# Patient Record
Sex: Male | Born: 1965
Health system: Southern US, Community
[De-identification: ages and names within clinical notes are randomized; demographics above are authoritative.]

## PROBLEM LIST (undated history)

## (undated) ENCOUNTER — Emergency Department (HOSPITAL_BASED_OUTPATIENT_CLINIC_OR_DEPARTMENT_OTHER): Payer: Self-pay | Source: Home / Self Care

## (undated) DIAGNOSIS — N179 Acute kidney failure, unspecified: Secondary | ICD-10-CM

## (undated) DIAGNOSIS — J159 Unspecified bacterial pneumonia: Secondary | ICD-10-CM

## (undated) DIAGNOSIS — I5032 Chronic diastolic (congestive) heart failure: Secondary | ICD-10-CM

## (undated) DIAGNOSIS — R0602 Shortness of breath: Secondary | ICD-10-CM

## (undated) DIAGNOSIS — R112 Nausea with vomiting, unspecified: Secondary | ICD-10-CM

## (undated) DIAGNOSIS — F1910 Other psychoactive substance abuse, uncomplicated: Secondary | ICD-10-CM

## (undated) DIAGNOSIS — D72829 Elevated white blood cell count, unspecified: Secondary | ICD-10-CM

## (undated) DIAGNOSIS — I4892 Unspecified atrial flutter: Secondary | ICD-10-CM

## (undated) HISTORY — DX: Unspecified bacterial pneumonia: J15.9

## (undated) HISTORY — DX: Shortness of breath: R06.02

## (undated) HISTORY — PX: COLONOSCOPY: SHX174

## (undated) HISTORY — DX: Acute kidney failure, unspecified: N17.9

## (undated) HISTORY — DX: Unspecified atrial flutter: I48.92

## (undated) HISTORY — DX: Chronic diastolic (congestive) heart failure: I50.32

## (undated) HISTORY — DX: Elevated white blood cell count, unspecified: D72.829

## (undated) HISTORY — DX: Nausea with vomiting, unspecified: R11.2

---

## 2001-11-09 ENCOUNTER — Encounter: Payer: Self-pay | Admitting: Emergency Medicine

## 2001-11-09 ENCOUNTER — Emergency Department (HOSPITAL_COMMUNITY): Admission: EM | Admit: 2001-11-09 | Discharge: 2001-11-09 | Payer: Self-pay | Admitting: Emergency Medicine

## 2002-11-24 ENCOUNTER — Emergency Department (HOSPITAL_COMMUNITY): Admission: EM | Admit: 2002-11-24 | Discharge: 2002-11-24 | Payer: Self-pay | Admitting: Emergency Medicine

## 2002-11-26 ENCOUNTER — Emergency Department (HOSPITAL_COMMUNITY): Admission: EM | Admit: 2002-11-26 | Discharge: 2002-11-26 | Payer: Self-pay

## 2004-09-05 ENCOUNTER — Emergency Department (HOSPITAL_COMMUNITY): Admission: EM | Admit: 2004-09-05 | Discharge: 2004-09-06 | Payer: Self-pay | Admitting: Emergency Medicine

## 2006-10-06 ENCOUNTER — Emergency Department (HOSPITAL_COMMUNITY): Admission: EM | Admit: 2006-10-06 | Discharge: 2006-10-06 | Payer: Self-pay | Admitting: Emergency Medicine

## 2007-04-05 ENCOUNTER — Emergency Department (HOSPITAL_COMMUNITY): Admission: EM | Admit: 2007-04-05 | Discharge: 2007-04-05 | Payer: Self-pay | Admitting: Emergency Medicine

## 2007-08-20 ENCOUNTER — Emergency Department (HOSPITAL_COMMUNITY): Admission: EM | Admit: 2007-08-20 | Discharge: 2007-08-20 | Payer: Self-pay | Admitting: Emergency Medicine

## 2008-04-25 ENCOUNTER — Emergency Department (HOSPITAL_COMMUNITY): Admission: EM | Admit: 2008-04-25 | Discharge: 2008-04-25 | Payer: Self-pay | Admitting: Emergency Medicine

## 2010-11-26 ENCOUNTER — Emergency Department (HOSPITAL_COMMUNITY)
Admission: EM | Admit: 2010-11-26 | Discharge: 2010-11-26 | Disposition: A | Discharge status: Home / Self Care | Payer: Self-pay | Attending: Emergency Medicine | Admitting: Emergency Medicine

## 2010-11-26 DIAGNOSIS — H02849 Edema of unspecified eye, unspecified eyelid: Secondary | ICD-10-CM | POA: Insufficient documentation

## 2010-11-26 DIAGNOSIS — H5789 Other specified disorders of eye and adnexa: Secondary | ICD-10-CM | POA: Insufficient documentation

## 2010-11-26 DIAGNOSIS — H579 Unspecified disorder of eye and adnexa: Secondary | ICD-10-CM | POA: Insufficient documentation

## 2010-11-26 DIAGNOSIS — H109 Unspecified conjunctivitis: Secondary | ICD-10-CM | POA: Insufficient documentation

## 2011-03-31 LAB — DIFFERENTIAL
Basophils Absolute: 0
Basophils Relative: 0
Eosinophils Absolute: 0.1
Eosinophils Relative: 3
Lymphocytes Relative: 31
Lymphs Abs: 1.7
Monocytes Absolute: 0.4
Monocytes Relative: 7
Neutro Abs: 3.2
Neutrophils Relative %: 60

## 2011-03-31 LAB — BASIC METABOLIC PANEL
BUN: 7
CO2: 25
Calcium: 9
Chloride: 108
Creatinine, Ser: 1.09
GFR calc Af Amer: >60
GFR calc non Af Amer: >60
Glucose, Bld: 77
Potassium: 3.7
Sodium: 140

## 2011-03-31 LAB — CBC
HCT: 41.6
Hemoglobin: 13.5
MCHC: 32.5
MCV: 70 — ABNORMAL LOW
Platelets: 280
RBC: 5.95 — ABNORMAL HIGH
RDW: 16.9 — ABNORMAL HIGH
WBC: 5.5

## 2011-04-20 LAB — I-STAT 8, (EC8 V) (CONVERTED LAB)
BUN: 8
Chloride: 105
Glucose, Bld: 117 — ABNORMAL HIGH
Hemoglobin: 17.3 — ABNORMAL HIGH
Potassium: 3.9
Sodium: 137
TCO2: 26

## 2011-04-20 LAB — DIFFERENTIAL
Eosinophils Absolute: 0.1
Eosinophils Relative: 2
Lymphocytes Relative: 23
Lymphs Abs: 1.7
Monocytes Absolute: 0.4

## 2011-04-20 LAB — CBC
HCT: 45.5
Hemoglobin: 14.7
MCV: 74.3 — ABNORMAL LOW
RDW: 16.5 — ABNORMAL HIGH
WBC: 7.3

## 2011-04-20 LAB — URINALYSIS, ROUTINE W REFLEX MICROSCOPIC
Bilirubin Urine: NEGATIVE
Glucose, UA: NEGATIVE
Ketones, ur: 15 — AB
pH: 5.5

## 2011-04-20 LAB — ETHANOL: Alcohol, Ethyl (B): <5

## 2011-04-20 LAB — RAPID URINE DRUG SCREEN, HOSP PERFORMED
Barbiturates: NOT DETECTED
Cocaine: POSITIVE — AB
Opiates: NOT DETECTED

## 2011-04-20 LAB — URINE MICROSCOPIC-ADD ON

## 2011-04-20 LAB — POCT I-STAT CREATININE: Operator id: 234501

## 2011-06-12 ENCOUNTER — Emergency Department (HOSPITAL_BASED_OUTPATIENT_CLINIC_OR_DEPARTMENT_OTHER)
Admission: EM | Admit: 2011-06-12 | Discharge: 2011-06-12 | Disposition: A | Discharge status: Home / Self Care | Payer: Self-pay | Attending: Emergency Medicine | Admitting: Emergency Medicine

## 2011-06-12 ENCOUNTER — Encounter: Payer: Self-pay | Admitting: *Deleted

## 2011-06-12 DIAGNOSIS — B353 Tinea pedis: Secondary | ICD-10-CM | POA: Insufficient documentation

## 2011-06-12 DIAGNOSIS — F17200 Nicotine dependence, unspecified, uncomplicated: Secondary | ICD-10-CM | POA: Insufficient documentation

## 2011-06-12 DIAGNOSIS — H109 Unspecified conjunctivitis: Secondary | ICD-10-CM | POA: Insufficient documentation

## 2011-06-12 MED ORDER — ERYTHROMYCIN 5 MG/GM OP OINT
TOPICAL_OINTMENT | Freq: Once | OPHTHALMIC | Status: AC
  Administered 2011-06-12: 10:00:00 via OPHTHALMIC
  Filled 2011-06-12: qty 3.5

## 2011-06-12 NOTE — ED Provider Notes (Signed)
History     CSN: [id number] Arrival date & time: 06/12/2011  7:55 AM   First MD Initiated Contact with Patient 06/12/11 [phone]      Chief Complaint  Patient presents with  . Eye Drainage    (Consider location/radiation/quality/duration/timing/severity/associated sxs/prior treatment) HPI  Patient with discharge to right eye for two weeks.  Eye is red with some itching.  Patient states he had conjunctivitis in May.  Patient with normal perceived vision.  No uri symptoms.    History reviewed. No pertinent past medical history.  History reviewed. No pertinent past surgical history.  No family history on file.  History  Substance Use Topics  . Smoking status: Current Everyday Smoker  . Smokeless tobacco: Not on file  . Alcohol Use: No      Review of Systems  All other systems reviewed and are negative.    Allergies  Review of patient's allergies indicates no known allergies.  Home Medications  No current outpatient prescriptions on file.  BP 116/66  Pulse 95  Temp(Src) 98.1 F (36.7 C) (Oral)  Resp 18  Ht 5\' 10"  (1.778 m)  SpO2 99%  Physical Exam  Nursing note and vitals reviewed. Constitutional: He is oriented to person, place, and time. He appears well-developed and well-nourished.  HENT:  Head: Normocephalic and atraumatic.  Eyes: Conjunctivae, EOM and lids are normal. Pupils are equal, round, and reactive to light. Right eye exhibits chemosis. Right eye exhibits no discharge and no exudate. No foreign body present in the right eye.  Cardiovascular: Normal rate and regular rhythm.   Pulmonary/Chest: Effort normal and breath sounds normal.  Abdominal: Soft.  Musculoskeletal: Normal range of motion.       Skin breakdown between 3rd and 4th and 4ht and 5th toe on left foot  Neurological: He is alert and oriented to person, place, and time.  Skin: Skin is warm and dry.  Psychiatric: He has a normal mood and affect.    ED Course  Procedures (including  critical care time)  Labs Reviewed - No data to display No results found.   No diagnosis found.    MDM    Hilario Quarry, MD 06/12/11 [phone]

## 2011-06-12 NOTE — ED Notes (Signed)
Pt amb to room 4 with quick steady gait in nad. Pt reports drainage from his right eye x 3 weeks, states this is the same drainage he had with pink eye over the summer.

## 2011-10-21 ENCOUNTER — Encounter (HOSPITAL_COMMUNITY): Payer: Self-pay | Admitting: Emergency Medicine

## 2011-10-21 ENCOUNTER — Inpatient Hospital Stay (HOSPITAL_COMMUNITY)
Admission: EM | Admit: 2011-10-21 | Discharge: 2011-10-24 | DRG: 194 | Disposition: A | Discharge status: Home / Self Care | Payer: 59 | Attending: Internal Medicine | Admitting: Internal Medicine

## 2011-10-21 ENCOUNTER — Emergency Department (HOSPITAL_COMMUNITY): Payer: Self-pay

## 2011-10-21 DIAGNOSIS — D72829 Elevated white blood cell count, unspecified: Secondary | ICD-10-CM | POA: Diagnosis present

## 2011-10-21 DIAGNOSIS — I5032 Chronic diastolic (congestive) heart failure: Secondary | ICD-10-CM | POA: Diagnosis present

## 2011-10-21 DIAGNOSIS — J159 Unspecified bacterial pneumonia: Secondary | ICD-10-CM | POA: Diagnosis present

## 2011-10-21 DIAGNOSIS — N289 Disorder of kidney and ureter, unspecified: Secondary | ICD-10-CM

## 2011-10-21 DIAGNOSIS — J13 Pneumonia due to Streptococcus pneumoniae: Principal | ICD-10-CM | POA: Diagnosis present

## 2011-10-21 DIAGNOSIS — R509 Fever, unspecified: Secondary | ICD-10-CM | POA: Diagnosis present

## 2011-10-21 DIAGNOSIS — J189 Pneumonia, unspecified organism: Secondary | ICD-10-CM

## 2011-10-21 DIAGNOSIS — N179 Acute kidney failure, unspecified: Secondary | ICD-10-CM | POA: Diagnosis present

## 2011-10-21 DIAGNOSIS — R0602 Shortness of breath: Secondary | ICD-10-CM | POA: Diagnosis present

## 2011-10-21 DIAGNOSIS — I4892 Unspecified atrial flutter: Secondary | ICD-10-CM | POA: Diagnosis present

## 2011-10-21 DIAGNOSIS — F17200 Nicotine dependence, unspecified, uncomplicated: Secondary | ICD-10-CM | POA: Diagnosis present

## 2011-10-21 DIAGNOSIS — R0902 Hypoxemia: Secondary | ICD-10-CM

## 2011-10-21 DIAGNOSIS — R112 Nausea with vomiting, unspecified: Secondary | ICD-10-CM | POA: Diagnosis present

## 2011-10-21 DIAGNOSIS — I509 Heart failure, unspecified: Secondary | ICD-10-CM | POA: Diagnosis present

## 2011-10-21 DIAGNOSIS — D518 Other vitamin B12 deficiency anemias: Secondary | ICD-10-CM | POA: Diagnosis present

## 2011-10-21 DIAGNOSIS — I4891 Unspecified atrial fibrillation: Secondary | ICD-10-CM | POA: Diagnosis not present

## 2011-10-21 DIAGNOSIS — Z720 Tobacco use: Secondary | ICD-10-CM | POA: Diagnosis present

## 2011-10-21 HISTORY — DX: Other psychoactive substance abuse, uncomplicated: F19.10

## 2011-10-21 LAB — CBC
Hemoglobin: 13.7 g/dL (ref 13.0–17.0)
MCHC: 34.2 g/dL (ref 30.0–36.0)

## 2011-10-21 LAB — DIFFERENTIAL
Basophils Absolute: 0 10*3/uL (ref 0.0–0.1)
Basophils Relative: 0 % (ref 0–1)
Eosinophils Absolute: 0 10*3/uL (ref 0.0–0.7)
Lymphocytes Relative: 5 % — ABNORMAL LOW (ref 12–46)
Neutrophils Relative %: 90 % — ABNORMAL HIGH (ref 43–77)
WBC Morphology: INCREASED

## 2011-10-21 LAB — COMPREHENSIVE METABOLIC PANEL
AST: 35 U/L (ref 0–37)
Albumin: 3.1 g/dL — ABNORMAL LOW (ref 3.5–5.2)
Calcium: 8.2 mg/dL — ABNORMAL LOW (ref 8.4–10.5)
Chloride: 101 mEq/L (ref 96–112)
Creatinine, Ser: 1.68 mg/dL — ABNORMAL HIGH (ref 0.50–1.35)

## 2011-10-21 LAB — URINALYSIS, ROUTINE W REFLEX MICROSCOPIC
Glucose, UA: NEGATIVE mg/dL
Specific Gravity, Urine: 1.022 (ref 1.005–1.030)
pH: 6 (ref 5.0–8.0)

## 2011-10-21 LAB — URINE MICROSCOPIC-ADD ON: Urine-Other: NONE SEEN

## 2011-10-21 LAB — MAGNESIUM: Magnesium: 1.7 mg/dL (ref 1.5–2.5)

## 2011-10-21 MED ORDER — NICOTINE 14 MG/24HR TD PT24
14.0000 mg | MEDICATED_PATCH | Freq: Every day | TRANSDERMAL | Status: DC
  Administered 2011-10-21 – 2011-10-24 (×4): 14 mg via TRANSDERMAL
  Filled 2011-10-21 (×5): qty 1

## 2011-10-21 MED ORDER — DEXTROSE 5 % IV SOLN
1.0000 g | INTRAVENOUS | Status: DC
  Administered 2011-10-21 – 2011-10-23 (×3): 1 g via INTRAVENOUS
  Filled 2011-10-21 (×4): qty 10

## 2011-10-21 MED ORDER — ONDANSETRON HCL 4 MG/2ML IJ SOLN
4.0000 mg | Freq: Once | INTRAMUSCULAR | Status: AC
  Administered 2011-10-21: 4 mg via INTRAVENOUS
  Filled 2011-10-21: qty 2

## 2011-10-21 MED ORDER — ACETAMINOPHEN 325 MG PO TABS
650.0000 mg | ORAL_TABLET | Freq: Once | ORAL | Status: AC
  Administered 2011-10-21: 650 mg via ORAL
  Filled 2011-10-21: qty 2

## 2011-10-21 MED ORDER — ONDANSETRON HCL 4 MG/2ML IJ SOLN: 4.0000 mg | Freq: Four times a day (QID) | INTRAMUSCULAR | Status: DC | PRN

## 2011-10-21 MED ORDER — ALBUTEROL SULFATE (5 MG/ML) 0.5% IN NEBU: 2.5000 mg | INHALATION_SOLUTION | Freq: Four times a day (QID) | RESPIRATORY_TRACT | Status: DC | PRN

## 2011-10-21 MED ORDER — ACETAMINOPHEN 650 MG RE SUPP: 650.0000 mg | Freq: Four times a day (QID) | RECTAL | Status: DC | PRN

## 2011-10-21 MED ORDER — ONDANSETRON HCL 4 MG PO TABS: 4.0000 mg | ORAL_TABLET | Freq: Four times a day (QID) | ORAL | Status: DC | PRN

## 2011-10-21 MED ORDER — MORPHINE SULFATE 2 MG/ML IJ SOLN: 1.0000 mg | INTRAMUSCULAR | Status: DC | PRN

## 2011-10-21 MED ORDER — ACETAMINOPHEN 325 MG PO TABS
650.0000 mg | ORAL_TABLET | Freq: Four times a day (QID) | ORAL | Status: DC | PRN
  Administered 2011-10-21 – 2011-10-22 (×3): 650 mg via ORAL
  Filled 2011-10-21 (×3): qty 2

## 2011-10-21 MED ORDER — SODIUM CHLORIDE 0.9 % IV SOLN
INTRAVENOUS | Status: DC
  Administered 2011-10-21 – 2011-10-24 (×4): via INTRAVENOUS

## 2011-10-21 MED ORDER — OXYCODONE HCL 5 MG PO TABS
5.0000 mg | ORAL_TABLET | ORAL | Status: DC | PRN
  Administered 2011-10-22 – 2011-10-23 (×3): 5 mg via ORAL
  Filled 2011-10-21 (×3): qty 1

## 2011-10-21 MED ORDER — DEXTROSE 5 % IV SOLN
500.0000 mg | INTRAVENOUS | Status: DC
  Administered 2011-10-21 – 2011-10-23 (×3): 500 mg via INTRAVENOUS
  Filled 2011-10-21 (×4): qty 500

## 2011-10-21 MED ORDER — ALBUTEROL SULFATE (5 MG/ML) 0.5% IN NEBU
5.0000 mg | INHALATION_SOLUTION | Freq: Once | RESPIRATORY_TRACT | Status: AC
  Administered 2011-10-21: 5 mg via RESPIRATORY_TRACT
  Filled 2011-10-21: qty 1

## 2011-10-21 MED ORDER — SODIUM CHLORIDE 0.9 % IV BOLUS (SEPSIS)
1000.0000 mL | Freq: Once | INTRAVENOUS | Status: AC
  Administered 2011-10-21: 1000 mL via INTRAVENOUS

## 2011-10-21 MED ORDER — IPRATROPIUM BROMIDE 0.02 % IN SOLN
0.5000 mg | Freq: Once | RESPIRATORY_TRACT | Status: AC
  Administered 2011-10-21: 0.5 mg via RESPIRATORY_TRACT
  Filled 2011-10-21: qty 2.5

## 2011-10-21 MED ORDER — ENOXAPARIN SODIUM 40 MG/0.4ML ~~LOC~~ SOLN
40.0000 mg | SUBCUTANEOUS | Status: DC
  Administered 2011-10-21 – 2011-10-23 (×3): 40 mg via SUBCUTANEOUS
  Filled 2011-10-21 (×4): qty 0.4

## 2011-10-21 MED ORDER — GUAIFENESIN-DM 100-10 MG/5ML PO SYRP: 5.0000 mL | ORAL_SOLUTION | ORAL | Status: DC | PRN

## 2011-10-21 NOTE — ED Provider Notes (Signed)
History     CSN: [id number]  Arrival date & time 10/21/11  1553   First MD Initiated Contact with Patient 10/21/11 1603      Chief Complaint  Patient presents with  . Morning Sickness    (Consider location/radiation/quality/duration/timing/severity/associated sxs/prior treatment) The history is provided by the patient.  pt c/o fevers, 'bad' non productive cough, body aches, headaches, sore throat, in past 3-4 days. Smokes 2-3 packs per day. Denies hx asthma, but states feels wheezy at times. No known ill contacts. Denies trouble swallowing. + mild sob. No chest pain. No abd pain. States intermittent nvd. Emesis clear, not bloody or bilious. Diarrhea not bloody. No known ill contacts or bad food exposure. Denies hx chronic illness. No night sweats/chills. Headaches frontal, gradual onset, comes and goes, c/w prior headaches. No neck stiffness/pain. No sinus drainage or pain.   History reviewed. No pertinent past medical history.  History reviewed. No pertinent past surgical history.  No family history on file.  History  Substance Use Topics  . Smoking status: Current Everyday Smoker  . Smokeless tobacco: Not on file  . Alcohol Use: No      Review of Systems  Constitutional: Negative for fever and chills.  HENT: Positive for sore throat. Negative for neck pain, neck stiffness and sinus pressure.   Eyes: Negative for pain, discharge, redness and visual disturbance.  Respiratory: Positive for cough and shortness of breath.   Cardiovascular: Negative for chest pain.  Gastrointestinal: Positive for nausea, vomiting and diarrhea. Negative for abdominal pain.  Genitourinary: Negative for dysuria and flank pain.  Musculoskeletal: Negative for back pain.  Skin: Negative for rash.  Neurological: Positive for headaches. Negative for weakness and numbness.  Hematological: Does not bruise/bleed easily.  Psychiatric/Behavioral: Negative for confusion.    Allergies  Review of  patient's allergies indicates no known allergies.  Home Medications   Current Outpatient Rx  Name Route Sig Dispense Refill  . IBUPROFEN 400 MG PO TABS Oral Take 400 mg by mouth every 6 (six) hours as needed. For pain relief      BP 148/63  Pulse 124  Temp(Src) 103 F (39.4 C) (Oral)  SpO2 91%  Physical Exam  Nursing note and vitals reviewed. Constitutional: He is oriented to person, place, and time. He appears well-developed and well-nourished. No distress.  HENT:  Head: Atraumatic.  Nose: Nose normal.  Mouth/Throat: Oropharynx is clear and moist.       No sinus or temporal tenderness  Eyes: Pupils are equal, round, and reactive to light. Right eye exhibits no discharge. Left eye exhibits no discharge. No scleral icterus.  Neck: Normal range of motion. Neck supple. No tracheal deviation present.       Good rom. No stiffness or rigidity  Cardiovascular: Regular rhythm, normal heart sounds and intact distal pulses.  Exam reveals no gallop and no friction rub.   No murmur heard. Pulmonary/Chest: Effort normal. No accessory muscle usage. No respiratory distress.       Upper resp congestion, cough, sl wheeze.   Abdominal: Soft. Bowel sounds are normal. He exhibits no distension and no mass. There is no tenderness. There is no rebound and no guarding.  Genitourinary:       No cva tenderness  Musculoskeletal: Normal range of motion. He exhibits no edema and no tenderness.  Lymphadenopathy:    He has no cervical adenopathy.  Neurological: He is alert and oriented to person, place, and time.       Motor intact bilat.  Steady gait.   Skin: Skin is warm and dry. No rash noted. He is not diaphoretic.       No petechia, no rash   Psychiatric: He has a normal mood and affect.    ED Course  Procedures (including critical care time)   Labs Reviewed  CBC  DIFFERENTIAL  COMPREHENSIVE METABOLIC PANEL  URINALYSIS, ROUTINE W REFLEX MICROSCOPIC    Results for orders placed during the  hospital encounter of 10/21/11  CBC      Component Value Range   WBC 15.7 (*) 4.0 - 10.5 (K/uL)   RBC 5.96 (*) 4.22 - 5.81 (MIL/uL)   Hemoglobin 13.7  13.0 - 17.0 (g/dL)   HCT 11.9  14.7 - 82.9 (%)   MCV 67.3 (*) 78.0 - 100.0 (fL)   MCH 23.0 (*) 26.0 - 34.0 (pg)   MCHC 34.2  30.0 - 36.0 (g/dL)   RDW 56.2  13.0 - 86.5 (%)   Platelets 341  150 - 400 (K/uL)  DIFFERENTIAL      Component Value Range   Neutrophils Relative 90 (*) 43 - 77 (%)   Lymphocytes Relative 5 (*) 12 - 46 (%)   Monocytes Relative 5  3 - 12 (%)   Eosinophils Relative 0  0 - 5 (%)   Basophils Relative 0  0 - 1 (%)   Neutro Abs 14.1 (*) 1.7 - 7.7 (K/uL)   Lymphs Abs 0.8  0.7 - 4.0 (K/uL)   Monocytes Absolute 0.8  0.1 - 1.0 (K/uL)   Eosinophils Absolute 0.0  0.0 - 0.7 (K/uL)   Basophils Absolute 0.0  0.0 - 0.1 (K/uL)   RBC Morphology TARGET CELLS     WBC Morphology INCREASED BANDS (>20% BANDS)     Smear Review LARGE PLATELETS PRESENT    COMPREHENSIVE METABOLIC PANEL      Component Value Range   Sodium 135  135 - 145 (mEq/L)   Potassium 3.8  3.5 - 5.1 (mEq/L)   Chloride 101  96 - 112 (mEq/L)   CO2 23  19 - 32 (mEq/L)   Glucose, Bld 115 (*) 70 - 99 (mg/dL)   BUN 15  6 - 23 (mg/dL)   Creatinine, Ser 7.84 (*) 0.50 - 1.35 (mg/dL)   Calcium 8.2 (*) 8.4 - 10.5 (mg/dL)   Total Protein 6.7  6.0 - 8.3 (g/dL)   Albumin 3.1 (*) 3.5 - 5.2 (g/dL)   AST 35  0 - 37 (U/L)   ALT 36  0 - 53 (U/L)   Alkaline Phosphatase 75  39 - 117 (U/L)   Total Bilirubin 1.9 (*) 0.3 - 1.2 (mg/dL)   GFR calc non Af Amer 48 (*) >90 (mL/min)   GFR calc Af Amer 56 (*) >90 (mL/min)   Dg Chest 2 View  10/21/2011  *RADIOLOGY REPORT*  Clinical Data: Cough.  Fever and shortness of breath.  CHEST - 2 VIEW  Comparison: None.  Findings: The heart size is normal.  New left lower lobe airspace disease is present.  The right lung is clear.  The upper lobes are clear bilaterally.  The visualized soft tissues and bony thorax are unremarkable.  IMPRESSION:   1.  New left lower lobe pneumonia.  Recommend follow-up chest radiograph to assure resolution of this lesion. 2.  The lungs are otherwise clear.  Original Report Authenticated By: Jamesetta Orleans. MATTERN, M.D.      MDM  Iv ns bolus. Cxr. Labs.  Albuterol and atrovent neb. Tylenol po.  Date: 10/21/2011  Rate: 115  Rhythm: sinus tachycardia  QRS Axis: normal  Intervals: normal  ST/T Wave abnormalities: nonspecific ST/T changes  Conduction Disutrbances:none  Narrative Interpretation:   Old EKG Reviewed: changes noted    pna on cxr. No recent hospitalization. Rocephin and zithromax iv for cap.   Recheck no wheezing post neb. Pt does note mild dyspnea. Room air pulse ox 88%. 2 liters 91-92%.  Given cap, hypoxia, renal insuff, recent nvd. will admit.      Suzi Roots, MD 10/21/11 [phone]

## 2011-10-21 NOTE — H&P (Signed)
PCP:   No primary provider on file.   Chief Complaint:  Productive cough, Fever, SOB, general malaise.  HPI: 46 y/o male with pmh significant for tobacco abuse; come to ED complaining of general malaise, productive cough and on/off fever for the last 5 days. He reports at the beginning he thought was just a cold and start using OTC cold remedies; but symptoms continue worsen and for the last 48 hours he has not been able to eat or drink due to associated nausea; also with 1 episode of vomiting and diarrhea.   In the ED he was found dehydrated; with fever of 103; hypoxic with O2 sat in 89% and acute renal failure with Cr of 1.68. CXR demonstrated LLL PNA.  He also reports some wheezing and headaches; but denies chest pain, melena, hematochezia, abdominal pain, blurred vision or any other acute complaints.  Allergies:  No Known Allergies   History reviewed. No pertinent past medical history.  History reviewed. No pertinent past surgical history.  Prior to Admission medications   Medication Sig Start Date End Date Taking? Authorizing Provider  ibuprofen (ADVIL,MOTRIN) 400 MG tablet Take 400 mg by mouth every 6 (six) hours as needed. For pain relief   Yes Historical Provider, MD    Social History:  reports that he has been smoking.  He does not have any smokeless tobacco history on file. Endorses hx of alcohol in the past (heavy use); clean for almost 14 months. Also reports marihuana and crack use in the past (clean for 5 years).  Family History:  father and mother with sickle cell trait; Mother also with dementia and COPD (oxygen dependent) No pertinent family history. Father and one sister with DM.  Review of Systems:  Negative except as mentioned on HPI.   Physical Exam: Blood pressure 148/63, pulse 117, temperature 103 F (39.4 C), temperature source Oral, resp. rate 25, SpO2 89.00%. Constitutional: He is oriented to person, place, and time. Dry mucous membranes and ill  appearance.  HENT:  Head: Atraumatic.  Nose: Nose normal.  Mouth/Throat: Oropharynx with dry MM; no erythema and no exudates.  Eyes: Pupils are equal, round, and reactive to light. Right eye exhibits no discharge. Left eye exhibits no discharge. No scleral icterus.  Neck: Normal range of motion. Neck supple. No tracheal deviation present. Good rom. No stiffness or rigidity  Cardiovascular: Regular rhythm, Tachycardic; no murmurs, no gallops or rubs. Pulmonary/Chest: No accessory  Muscle use; scattered wheezes and positive rhonchi (especially LLL)  Abdominal: Soft. No tenderness, no rebound; positive BS and no distension. Musculoskeletal: Normal range of motion. He exhibits no edema and no tenderness.  Lymphadenopathy: He has no cervical adenopathy.  Neurological: He is alert and oriented to person, place, and time. Motor intact bilat. Steady gait. Non focal deficit. Skin: Clammy and warm to touch; no rash or petechiae.   Labs on Admission:  Results for orders placed during the hospital encounter of 10/21/11 (from the past 48 hour(s))  CBC     Status: Abnormal   Collection Time   10/21/11  4:45 PM      Component Value Range Comment   WBC 15.7 (*) 4.0 - 10.5 (K/uL)    RBC 5.96 (*) 4.22 - 5.81 (MIL/uL)    Hemoglobin 13.7  13.0 - 17.0 (g/dL)    HCT 16.1  09.6 - 04.5 (%)    MCV 67.3 (*) 78.0 - 100.0 (fL)    MCH 23.0 (*) 26.0 - 34.0 (pg)    MCHC 34.2  30.0 - 36.0 (g/dL)    RDW 86.5  78.4 - 69.6 (%)    Platelets 341  150 - 400 (K/uL)   DIFFERENTIAL     Status: Abnormal   Collection Time   10/21/11  4:45 PM      Component Value Range Comment   Neutrophils Relative 90 (*) 43 - 77 (%)    Lymphocytes Relative 5 (*) 12 - 46 (%)    Monocytes Relative 5  3 - 12 (%)    Eosinophils Relative 0  0 - 5 (%)    Basophils Relative 0  0 - 1 (%)    Neutro Abs 14.1 (*) 1.7 - 7.7 (K/uL)    Lymphs Abs 0.8  0.7 - 4.0 (K/uL)    Monocytes Absolute 0.8  0.1 - 1.0 (K/uL)    Eosinophils Absolute 0.0  0.0 -  0.7 (K/uL)    Basophils Absolute 0.0  0.0 - 0.1 (K/uL)    RBC Morphology TARGET CELLS   SCHISTOCYTES PRESENT (2-5/hpf)   WBC Morphology INCREASED BANDS (>20% BANDS)      Smear Review LARGE PLATELETS PRESENT     COMPREHENSIVE METABOLIC PANEL     Status: Abnormal   Collection Time   10/21/11  4:45 PM      Component Value Range Comment   Sodium 135  135 - 145 (mEq/L)    Potassium 3.8  3.5 - 5.1 (mEq/L)    Chloride 101  96 - 112 (mEq/L)    CO2 23  19 - 32 (mEq/L)    Glucose, Bld 115 (*) 70 - 99 (mg/dL)    BUN 15  6 - 23 (mg/dL)    Creatinine, Ser 2.95 (*) 0.50 - 1.35 (mg/dL)    Calcium 8.2 (*) 8.4 - 10.5 (mg/dL)    Total Protein 6.7  6.0 - 8.3 (g/dL)    Albumin 3.1 (*) 3.5 - 5.2 (g/dL)    AST 35  0 - 37 (U/L)    ALT 36  0 - 53 (U/L)    Alkaline Phosphatase 75  39 - 117 (U/L)    Total Bilirubin 1.9 (*) 0.3 - 1.2 (mg/dL)    GFR calc non Af Amer 48 (*) >90 (mL/min)    GFR calc Af Amer 56 (*) >90 (mL/min)     Radiological Exams on Admission: Dg Chest 2 View  10/21/2011  *RADIOLOGY REPORT*  Clinical Data: Cough.  Fever and shortness of breath.  CHEST - 2 VIEW  Comparison: None.  Findings: The heart size is normal.  New left lower lobe airspace disease is present.  The right lung is clear.  The upper lobes are clear bilaterally.  The visualized soft tissues and bony thorax are unremarkable.  IMPRESSION:  1.  New left lower lobe pneumonia.  Recommend follow-up chest radiograph to assure resolution of this lesion. 2.  The lungs are otherwise clear.  Original Report Authenticated By: Jamesetta Orleans. MATTERN, M.D.    Assessment/Plan 1-SOB 2/2 Community acquired bacterial pneumonia: will admit to med surg bed; provide fluid resuscitation and supportive care, get strep urine antigen, legionella antigen and sputum cx; will treat with zithromax and rocephin. Will use supplemental oxygen. Will check HIV.  2-N&V (nausea and vomiting), Fever and general malaise: associated with #1; will use PRN  antiemetics and antipyretics.  3-ARF (acute renal failure): pre-renal in origin due to dehydration. Will give IVF's; will check random urine and creatinine.  4-Tobacco abuse: counseling provided. Will start nicotine patch.  5-Leukocytosis: 2/2 demargination and infection.  Will treat with abx's; will give IVF's and will follow trend of his WBC's.  6-Hx of alcohol and drug abuse: will check alcohol level and UDS.  7-Hx of first degree member with DM: will check A1C.  8-DVT:lovenox     Time Spent on Admission: 45 minutes  Verlee Pope [facility] [phone]  10/21/2011, 6:37 PM

## 2011-10-21 NOTE — ED Notes (Signed)
Patient transported to X-ray 

## 2011-10-21 NOTE — ED Notes (Signed)
Per pt, N/V/D since Thursday, fevers on and off. Pt states intermit fatigue and headaches. Pt reports dry cough only.

## 2011-10-21 NOTE — ED Notes (Signed)
Respiratory paged for nebs. 

## 2011-10-21 NOTE — ED Notes (Signed)
ION:[id number]<MW> Expected date:10/21/11<BR> Expected time: 3:34 PM<BR> Means of arrival:Ambulance<BR> Comments:<BR> M51. 46 yo m. Sick; vomiting, diarrhea and positve orthros. 15 mins

## 2011-10-22 DIAGNOSIS — I4892 Unspecified atrial flutter: Secondary | ICD-10-CM | POA: Diagnosis present

## 2011-10-22 LAB — LEGIONELLA ANTIGEN, URINE

## 2011-10-22 LAB — CBC
Platelets: 289 10*3/uL (ref 150–400)
RBC: 5.62 MIL/uL (ref 4.22–5.81)
RDW: 14.5 % (ref 11.5–15.5)
WBC: 17.1 10*3/uL — ABNORMAL HIGH (ref 4.0–10.5)

## 2011-10-22 LAB — HEMOGLOBIN A1C
Hgb A1c MFr Bld: 5.7 % — ABNORMAL HIGH (ref <5.7)
Mean Plasma Glucose: 117 mg/dL — ABNORMAL HIGH (ref <117)

## 2011-10-22 LAB — BASIC METABOLIC PANEL
Calcium: 8.1 mg/dL — ABNORMAL LOW (ref 8.4–10.5)
GFR calc Af Amer: 66 mL/min — ABNORMAL LOW (ref >90)
GFR calc non Af Amer: 57 mL/min — ABNORMAL LOW (ref >90)
Sodium: 137 mEq/L (ref 135–145)

## 2011-10-22 LAB — TSH: TSH: 0.311 u[IU]/mL — ABNORMAL LOW (ref 0.350–4.500)

## 2011-10-22 LAB — RAPID URINE DRUG SCREEN, HOSP PERFORMED
Amphetamines: NOT DETECTED
Benzodiazepines: NOT DETECTED
Opiates: NOT DETECTED

## 2011-10-22 LAB — STREP PNEUMONIAE URINARY ANTIGEN: Strep Pneumo Urinary Antigen: POSITIVE — AB

## 2011-10-22 LAB — HIV ANTIBODY (ROUTINE TESTING W REFLEX): HIV: NONREACTIVE

## 2011-10-22 MED ORDER — DILTIAZEM HCL 100 MG IV SOLR
5.0000 mg/h | INTRAVENOUS | Status: DC
  Administered 2011-10-22: 5 mg/h via INTRAVENOUS
  Administered 2011-10-23: 10 mg/h via INTRAVENOUS
  Filled 2011-10-22 (×2): qty 100

## 2011-10-22 MED ORDER — IBUPROFEN 400 MG PO TABS
400.0000 mg | ORAL_TABLET | Freq: Once | ORAL | Status: AC
  Administered 2011-10-22: 400 mg via ORAL
  Filled 2011-10-22: qty 1

## 2011-10-22 MED ORDER — K PHOS MONO-SOD PHOS DI & MONO 155-852-130 MG PO TABS
500.0000 mg | ORAL_TABLET | Freq: Two times a day (BID) | ORAL | Status: DC
  Administered 2011-10-22 – 2011-10-24 (×5): 500 mg via ORAL
  Filled 2011-10-22 (×6): qty 2

## 2011-10-22 MED ORDER — METOPROLOL TARTRATE 1 MG/ML IV SOLN
2.5000 mg | Freq: Once | INTRAVENOUS | Status: AC
  Administered 2011-10-22: 2.5 mg via INTRAVENOUS
  Filled 2011-10-22: qty 5

## 2011-10-22 NOTE — Progress Notes (Signed)
Pt HR remains at 168, temp decreased to 98.4, BP 115/73 O2 sat 96 on 2L. Dr Gwenlyn Perking notified of status. Annitta Needs, RN

## 2011-10-22 NOTE — Progress Notes (Addendum)
Subjective: Patient still with fever overnight; but feeling a lot better overall. Appetite still poor. No CP; he is also breathing better and his O2 sat on RA was in the 93%  Objective: Vital signs in last 24 hours: Temp:  [98.4 F (36.9 C)-103 F (39.4 C)] 98.4 F (36.9 C) (04/14 0848) Pulse Rate:  [113-125] 113  (04/14 0500) Resp:  [20-25] 20  (04/14 0500) BP: (107-148)/(54-73) 117/60 mmHg (04/14 0500) SpO2:  [89 %-100 %] 96 % (04/14 0500) Weight:  [80.831 kg (178 lb 3.2 oz)] 80.831 kg (178 lb 3.2 oz) (04/13 2020) Weight change:  Last BM Date: 10/21/11  Intake/Output from previous day: 04/13 0701 - 04/14 0700 In: 816.7 [I.V.:816.7] Out: 650 [Urine:650]     Physical Exam: General: Alert, awake, oriented x3, in no acute distress. HEENT: No bruits, no goiter. Heart: Regular rhythm, still mild tachycardic; No murmurs, rubs or gallops. Lungs: improved air movement; no wheezing; some scattered rhonchi still present. Abdomen: Soft, nontender, nondistended, positive bowel sounds. Extremities: No clubbing, cyanosis or edema with positive pedal pulses. Neuro: Grossly intact, nonfocal.  Lab Results: Basic Metabolic Panel:  Basename 10/22/11 0445 10/21/11 2130 10/21/11 1645  NA 137 -- 135  K 4.1 -- 3.8  CL 103 -- 101  CO2 23 -- 23  GLUCOSE 109* -- 115*  BUN 13 -- 15  CREATININE 1.46* -- 1.68*  CALCIUM 8.1* -- 8.2*  MG -- 1.7 --  PHOS -- 1.5* --   Liver Function Tests:  [facility] 10/21/11 1645  AST 35  ALT 36  ALKPHOS 75  BILITOT 1.9*  PROT 6.7  ALBUMIN 3.1*   CBC:  Basename 10/22/11 0445 10/21/11 1645  WBC 17.1* 15.7*  NEUTROABS -- 14.1*  HGB 13.0 13.7  HCT 38.4* 40.1  MCV 68.3* 67.3*  PLT 289 341   Hemoglobin A1C:  Basename 10/21/11 2130  HGBA1C 5.7*   Thyroid Function Tests:  Basename 10/21/11 2130  TSH 0.311*  T4TOTAL --  FREET4 --  T3FREE --  THYROIDAB --   Urine Drug Screen: Drugs of Abuse     Component Value Date/Time   LABOPIA NONE  DETECTED 10/21/2011 1827   COCAINSCRNUR NONE DETECTED 10/21/2011 1827   LABBENZ NONE DETECTED 10/21/2011 1827   AMPHETMU NONE DETECTED 10/21/2011 1827   THCU NONE DETECTED 10/21/2011 1827   LABBARB NONE DETECTED 10/21/2011 1827    Alcohol Level:  Basename 10/21/11 2130  ETH <11   Urinalysis:  Basename 10/21/11 1827  COLORURINE ORANGE*  LABSPEC 1.022  PHURINE 6.0  GLUCOSEU NEGATIVE  HGBUR TRACE*  BILIRUBINUR SMALL*  KETONESUR TRACE*  PROTEINUR 30*  UROBILINOGEN >8.0*  NITRITE NEGATIVE  LEUKOCYTESUR NEGATIVE    Studies/Results: Dg Chest 2 View  10/21/2011  *RADIOLOGY REPORT*  Clinical Data: Cough.  Fever and shortness of breath.  CHEST - 2 VIEW  Comparison: None.  Findings: The heart size is normal.  New left lower lobe airspace disease is present.  The right lung is clear.  The upper lobes are clear bilaterally.  The visualized soft tissues and bony thorax are unremarkable.  IMPRESSION:  1.  New left lower lobe pneumonia.  Recommend follow-up chest radiograph to assure resolution of this lesion. 2.  The lungs are otherwise clear.  Original Report Authenticated By: Jamesetta Orleans. MATTERN, M.D.    Medications: Scheduled Meds:   . acetaminophen  650 mg Oral Once  . albuterol  5 mg Nebulization Once  . azithromycin  500 mg Intravenous Q24H  . cefTRIAXone (ROCEPHIN)  IV  1 g Intravenous Q24H  . enoxaparin  40 mg Subcutaneous Q24H  . ipratropium  0.5 mg Nebulization Once  . nicotine  14 mg Transdermal Daily  . ondansetron (ZOFRAN) IV  4 mg Intravenous Once  . phosphorus  500 mg Oral BID  . sodium chloride  1,000 mL Intravenous Once   Continuous Infusions:   . sodium chloride 100 mL/hr at 10/22/11 1026   PRN Meds:.acetaminophen, acetaminophen, albuterol, guaiFENesin-dextromethorphan, morphine, ondansetron (ZOFRAN) IV, ondansetron, oxyCODONE  Assessment/Plan: 1-SOB 2/2 Strep pneumoniae pneumonia: Improving. Will continue IVF antibiotics and supportive care.  2-N&V (nausea and  vomiting), Fever and general malaise: associated with #1; will use PRN antiemetics and antipyretics.   3-ARF (acute renal failure): pre-renal in origin due to dehydration. Continue re-hydration. Creatinine 1.46 and improving.  4-Tobacco abuse: counseling provided. Will continue nicotine patch.   5-Leukocytosis: 2/2 demargination and infection. Continue IVF's and antibiotics; will repeat CBC in am.  6-Hx of alcohol and drug abuse: Alcohol level and UDS negative. Patient congratulated for his effort and dedication of keeping himself clean. Encourage to continue doing such a good job.  7-Hx of first degree member with DM: Hyperglycemia; A1C 5.7; will need lifestyle changes but not medications at this point.  8-Atrial flutter: most likely triggered by fever and acute infection. Will treat with lopressor IV 2.5 mg and monitor him on telemetry.  9-DVT:lovenox     LOS: 1 day   Kiaira Pointer [facility] [phone]  10/22/2011, 10:45 AM

## 2011-10-22 NOTE — Progress Notes (Signed)
Pt noted with BP of 101/54 and HR of 170 temp 101 O2 sat 89% contacted dr Gwenlyn Perking ordered oxygen 2L and tylenol provided patient with tylenol and 2L O2 will check in 30 min and update physician. Annitta Needs, RN

## 2011-10-22 NOTE — Progress Notes (Signed)
EKG performed dr Gwenlyn Perking reviewed ordered to transfer patient to 4th floor for tele.gave pt lopressor as ordered  Annitta Needs, RN

## 2011-10-22 NOTE — Progress Notes (Signed)
Pt A-Fib with HR ranging from  140-150's on telemetry monitor. BP 101/63, R 18, T 97.4. Pt asymptomatic. MD paged, awaiting return call. Will continue to monitor.

## 2011-10-23 ENCOUNTER — Encounter (HOSPITAL_COMMUNITY): Payer: Self-pay | Admitting: Physician Assistant

## 2011-10-23 DIAGNOSIS — I4891 Unspecified atrial fibrillation: Secondary | ICD-10-CM

## 2011-10-23 DIAGNOSIS — I4892 Unspecified atrial flutter: Secondary | ICD-10-CM

## 2011-10-23 LAB — BASIC METABOLIC PANEL
BUN: 10 mg/dL (ref 6–23)
Chloride: 104 mEq/L (ref 96–112)
Creatinine, Ser: 0.92 mg/dL (ref 0.50–1.35)
GFR calc Af Amer: >90 mL/min (ref >90)
GFR calc non Af Amer: >90 mL/min (ref >90)
Glucose, Bld: 80 mg/dL (ref 70–99)

## 2011-10-23 LAB — CBC
HCT: 37.7 % — ABNORMAL LOW (ref 39.0–52.0)
Hemoglobin: 12.9 g/dL — ABNORMAL LOW (ref 13.0–17.0)
MCHC: 34.2 g/dL (ref 30.0–36.0)
MCV: 67.2 fL — ABNORMAL LOW (ref 78.0–100.0)
RDW: 14.5 % (ref 11.5–15.5)

## 2011-10-23 LAB — T4, FREE: Free T4: 1.1 ng/dL (ref 0.80–1.80)

## 2011-10-23 LAB — TSH: TSH: 1.381 u[IU]/mL (ref 0.350–4.500)

## 2011-10-23 LAB — CARDIAC PANEL(CRET KIN+CKTOT+MB+TROPI)
CK, MB: 1.6 ng/mL (ref 0.3–4.0)
Troponin I: <0.3 ng/mL (ref <0.30)

## 2011-10-23 LAB — MAGNESIUM: Magnesium: 2.4 mg/dL (ref 1.5–2.5)

## 2011-10-23 MED ORDER — DIGOXIN 0.25 MG/ML IJ SOLN
0.2500 mg | Freq: Once | INTRAMUSCULAR | Status: AC
  Administered 2011-10-23: 0.25 mg via INTRAVENOUS
  Filled 2011-10-23 (×2): qty 1

## 2011-10-23 MED ORDER — DILTIAZEM HCL 30 MG PO TABS
30.0000 mg | ORAL_TABLET | Freq: Four times a day (QID) | ORAL | Status: DC
  Administered 2011-10-23: 30 mg via ORAL
  Filled 2011-10-23 (×8): qty 1

## 2011-10-23 MED ORDER — DILTIAZEM HCL 100 MG IV SOLR
10.0000 mg/h | INTRAVENOUS | Status: DC
  Administered 2011-10-23 – 2011-10-24 (×2): 10 mg/h via INTRAVENOUS
  Filled 2011-10-23 (×2): qty 100

## 2011-10-23 MED ORDER — DIGOXIN 0.25 MG/ML IJ SOLN
0.2500 mg | Freq: Once | INTRAMUSCULAR | Status: AC
  Administered 2011-10-23: 0.25 mg via INTRAVENOUS
  Filled 2011-10-23: qty 1

## 2011-10-23 MED ORDER — ADULT MULTIVITAMIN W/MINERALS CH
1.0000 | ORAL_TABLET | Freq: Every day | ORAL | Status: DC
  Administered 2011-10-23 – 2011-10-24 (×2): 1 via ORAL
  Filled 2011-10-23 (×2): qty 1

## 2011-10-23 MED ORDER — DIGOXIN 125 MCG PO TABS: 0.1250 mg | ORAL_TABLET | Freq: Every day | ORAL | Status: DC

## 2011-10-23 NOTE — Progress Notes (Signed)
Subjective: Patient w/o fever; feeling a lot better overall. No CP. He continue to be on A. Fib/ atrial flutter; cardiology has recommended no anticoagulation at this point and to have 2-D echo done.  Objective: Vital signs in last 24 hours: Temp:  [97.4 F (36.3 C)-99 F (37.2 C)] 98.8 F (37.1 C) (04/15 1650) Pulse Rate:  [80-171] 171  (04/15 1650) Resp:  [18-24] 20  (04/15 1650) BP: (89-120)/(39-78) 119/78 mmHg (04/15 1650) SpO2:  [94 %-98 %] 94 % (04/15 1650) Weight:  [81 kg (178 lb 9.2 oz)] 81 kg (178 lb 9.2 oz) (04/14 2145) Weight change: 0.169 kg (6 oz) Last BM Date: 10/21/11  Intake/Output from previous day: 04/14 0701 - 04/15 0700 In: 1131.7 [P.O.:240; I.V.:891.7] Out: 950 [Urine:950] Total I/O In: 720 [P.O.:720] Out: 500 [Urine:500]   Physical Exam: General: Alert, awake, oriented x3, in no acute distress. HEENT: No bruits, no goiter. Heart: Regular rhythm, tachycardic; No murmurs, rubs or gallops. Lungs: improved air movement; no wheezing; some scattered rhonchi still present. Abdomen: Soft, nontender, nondistended, positive bowel sounds. Extremities: No clubbing, cyanosis or edema with positive pedal pulses. Neuro: Grossly intact, nonfocal.  Lab Results: Basic Metabolic Panel:  Basename 10/23/11 0420 10/22/11 0445 10/21/11 2130  NA 134* 137 --  K 4.0 4.1 --  CL 104 103 --  CO2 22 23 --  GLUCOSE 80 109* --  BUN 10 13 --  CREATININE 0.92 1.46* --  CALCIUM 8.2* 8.1* --  MG 2.4 -- 1.7  PHOS -- -- 1.5*   Liver Function Tests:  [facility] 10/21/11 1645  AST 35  ALT 36  ALKPHOS 75  BILITOT 1.9*  PROT 6.7  ALBUMIN 3.1*   CBC:  Basename 10/23/11 0420 10/22/11 0445 10/21/11 1645  WBC 13.0* 17.1* --  NEUTROABS -- -- 14.1*  HGB 12.9* 13.0 --  HCT 37.7* 38.4* --  MCV 67.2* 68.3* --  PLT 284 289 --   Hemoglobin A1C:  Basename 10/21/11 2130  HGBA1C 5.7*   Thyroid Function Tests:  Basename 10/23/11 0849  TSH 1.381  T4TOTAL --  FREET4 1.10    T3FREE --  THYROIDAB --   Urine Drug Screen: Drugs of Abuse     Component Value Date/Time   LABOPIA NONE DETECTED 10/21/2011 1827   COCAINSCRNUR NONE DETECTED 10/21/2011 1827   LABBENZ NONE DETECTED 10/21/2011 1827   AMPHETMU NONE DETECTED 10/21/2011 1827   THCU NONE DETECTED 10/21/2011 1827   LABBARB NONE DETECTED 10/21/2011 1827    Alcohol Level:  Basename 10/21/11 2130  ETH <11   Urinalysis:  Basename 10/21/11 1827  COLORURINE ORANGE*  LABSPEC 1.022  PHURINE 6.0  GLUCOSEU NEGATIVE  HGBUR TRACE*  BILIRUBINUR SMALL*  KETONESUR TRACE*  PROTEINUR 30*  UROBILINOGEN >8.0*  NITRITE NEGATIVE  LEUKOCYTESUR NEGATIVE    Studies/Results: No results found.  Medications: Scheduled Meds:    . azithromycin  500 mg Intravenous Q24H  . cefTRIAXone (ROCEPHIN)  IV  1 g Intravenous Q24H  . digoxin  0.25 mg Intravenous Once  . digoxin  0.25 mg Intravenous Once  . diltiazem  30 mg Oral Q6H  . enoxaparin  40 mg Subcutaneous Q24H  . metoprolol  2.5 mg Intravenous Once  . mulitivitamin with minerals  1 tablet Oral Daily  . nicotine  14 mg Transdermal Daily  . phosphorus  500 mg Oral BID  . DISCONTD: digoxin  0.125 mg Oral Daily   Continuous Infusions:    . sodium chloride 100 mL/hr at 10/23/11 1008  . DISCONTD:  diltiazem (CARDIZEM) infusion 10 mg/hr (10/23/11 1007)   PRN Meds:.acetaminophen, acetaminophen, albuterol, guaiFENesin-dextromethorphan, morphine, ondansetron (ZOFRAN) IV, ondansetron, oxyCODONE  Assessment/Plan: 1-SOB 2/2 Strep pneumoniae pneumonia: Improving. Will continue IV antibiotics and supportive care.  2-N&V (nausea and vomiting), Fever and general malaise: associated with #1; will continue PRN antiemetics and antipyretics.   3-ARF (acute renal failure): pre-renal in origin due to dehydration. Resolved. Creatinine 0.92  4-Tobacco abuse: counseling provided. Will continue nicotine patch.   5-Leukocytosis: 2/2 demargination and infection. Continue IVF's and  antibiotics; CBC 13.0 now.  6-Hx of alcohol and drug abuse: Alcohol level and UDS negative. Patient congratulated for his effort and dedication of keeping himself clean. Encourage to continue doing such a good job.  7-Hx of first degree member with DM: Hyperglycemia; A1C 5.7; will need lifestyle changes but not medications at this point.  8-Atrial flutter/A. fib: most likely triggered by fever and acute infection. Appreciate cardiology assistance. Will follow recommendations. Patient asymptomatic.  9-Anemia: chronic and microcytic; will check anemia panel. He reports having hx of sickle cell trait.  10-Abnormal TSH: most likely associated with sick thyroid; will repeat TSH and check Free T4   11-DVT:lovenox     LOS: 2 days   Chadric Kimberley [facility] [phone]  10/23/2011, 4:57 PM

## 2011-10-23 NOTE — Consult Note (Signed)
CARDIOLOGY CONSULT NOTE  Patient ID: [name] [name], MRN: [id number], DOB/AGE: 1966-04-10 46 y.o. Admit date: 10/21/2011 Date of Consult: 10/23/2011  Primary Physician: No primary provider on file. Primary Cardiologist: New to [place]  Chief Complaint: cough  HPI: 62 y/o M with no prior cardiac hx but prior hx of substance abuse presented to [facility] with cough, congestion, and fever. He started to feel poorly last week but these symptoms increased over the weekend despite OTC cold remedies prompting him to come to the ER. He was unable to eat or drink due to nausea with vomiting. In the ER he was found to be dehydrated with elevated Cr 1.68, hypoxic with O2 sat 89% and CXR demonstrating LLL PNA. Temp was 103. Initial WBC was 15.7. He was initiated on antibiotic therapy and prn nebs. Yesterday he was noted to go into atrial fibrillation with RVR rates 140s-170s and was treated with IV lopressor then Cardizem gtt after arrhythmia continued. He also received IV digoxin x 2 doses overnight. He converted to NSR spontaneously, but had recurrence this AM until just after 9am. He is back in NSR at this time. CE's are negative x 1. UDS was negative. TSH was suppressed, free hormones are pending. He feels much better than on admission but is still coughing. He denies any awareness of palpitations, and denied CP/SOB.  Past Medical History  Diagnosis Date  . Substance abuse     Prior hx of cocaine (quit 5 years ago)/EtOH (clean for 14 months) as of 10/2011      Most Recent Cardiac Studies: None   Surgical History: History reviewed. No pertinent past surgical history.   Home Meds: Prior to Admission medications   Medication Sig Start Date End Date Taking? Authorizing Provider  ibuprofen (ADVIL,MOTRIN) 400 MG tablet Take 400 mg by mouth every 6 (six) hours as needed. For pain relief   Yes Historical Provider, MD    Inpatient Medications:     . azithromycin  500 mg Intravenous Q24H  .  cefTRIAXone (ROCEPHIN)  IV  1 g Intravenous Q24H  . digoxin  0.25 mg Intravenous Once  . digoxin  0.25 mg Intravenous Once  . digoxin  0.125 mg Oral Daily  . enoxaparin  40 mg Subcutaneous Q24H  . ibuprofen  400 mg Oral Once  . metoprolol  2.5 mg Intravenous Once  . nicotine  14 mg Transdermal Daily  . phosphorus  500 mg Oral BID    Allergies: No Known Allergies  History   Social History  . Marital Status: Single    Spouse Name: N/A    Number of Children: N/A  . Years of Education: N/A   Occupational History  . Not on file.   Social History Main Topics  . Smoking status: Current Everyday Smoker  . Smokeless tobacco: Not on file  . Alcohol Use: No     Previously drank heavily, has been sober for 14 months  . Drug Use: No     As of 10/2011, has been clean from crack cocaine for 5 years. Hx of marijuana.  . Sexually Active: Not on file   Other Topics Concern  . Not on file   Social History Narrative  . No narrative on file     Family History  Problem Relation Age of Onset  . Heart disease Neg Hx      Review of Systems: General: negative for night sweats or weight changes. +fever Cardiovascular: see above. No orthopnea, PND, LEE Dermatological: negative  for rash Respiratory: +productive cough Urologic: negative for hematuria Abdominal: negative for bright red blood per rectum, melena, or hematemesis. +n/v as above Neurologic: negative for visual changes, syncope, or dizziness All other systems reviewed and are otherwise negative except as noted above.  Labs:  [facility] 10/23/11 0849  CKTOTAL 182  CKMB 1.6  TROPONINI <0.30   Lab Results  Component Value Date   WBC 13.0* 10/23/2011   HGB 12.9* 10/23/2011   HCT 37.7* 10/23/2011   MCV 67.2* 10/23/2011   PLT 284 10/23/2011     Lab 10/23/11 0420 10/21/11 1645  NA 134* --  K 4.0 --  CL 104 --  CO2 22 --  BUN 10 --  CREATININE 0.92 --  CALCIUM 8.2* --  PROT -- 6.7  BILITOT -- 1.9*  ALKPHOS -- 75  ALT --  36  AST -- 35  GLUCOSE 80 --   Radiology/Studies:  1. Chest 2 View 10/21/2011  *RADIOLOGY REPORT*  Clinical Data: Cough.  Fever and shortness of breath.  CHEST - 2 VIEW  Comparison: None.  Findings: The heart size is normal.  New left lower lobe airspace disease is present.  The right lung is clear.  The upper lobes are clear bilaterally.  The visualized soft tissues and bony thorax are unremarkable.  IMPRESSION:  1.  New left lower lobe pneumonia.  Recommend follow-up chest radiograph to assure resolution of this lesion. 2.  The lungs are otherwise clear.  Original Report Authenticated By: Jamesetta Orleans. MATTERN, M.D.    EKG: 10/21/11 sinus tach 115bpm NSST changes 10/22/11 18:33 atrial flutter 2:1 conduction 168bpm NSST changes 10/22/11 23:00 atrial fib 168bpm  Physical Exam: Blood pressure 91/47, pulse 88, temperature 99 F (37.2 C), temperature source Oral, resp. rate 24, height 5\' 10"  (1.778 m), weight 178 lb 9.2 oz (81 kg), SpO2 95.00%. General: Well developed, well nourished, in no acute distress. Head: Normocephalic, atraumatic, sclera non-icteric, no xanthomas, nares are without discharge.  Neck: Negative for carotid bruits. JVD not elevated. Lungs: Decreased lung sounds L base. Remainder of lungs are clear bilaterally without wheezes, rales, or rhonchi. Breathing is unlabored. Heart: RRR with S1 S2. No murmurs, rubs, or gallops appreciated. Abdomen: Soft, non-tender, non-distended with normoactive bowel sounds. No hepatomegaly. No rebound/guarding. No obvious abdominal masses. Msk:  Strength and tone appear normal for age. Extremities: No clubbing or cyanosis. No edema.  Distal pedal pulses are 2+ and equal bilaterally. Neuro: Alert and oriented X 3. Moves all extremities spontaneously. Psych:  Responds to questions appropriately with a normal affect.   Assessment and Plan:   Signed, Ronie Spies PA-C 10/23/2011, 12:38 PM  As above, patient seen and examined. 46 year old male with  no prior history admitted with pneumonia. Patient describes approximately 2 weeks of general malaise, fevers, chills. He also had dyspnea. He has a chronic productive cough. There was no chest pain, palpitations or syncope. A chest x-ray demonstrated pneumonia and he has been treated with IV antibiotics. While here he has been noted to have atrial flutter and atrial fibrillation on telemetry. These rhythm disturbances have been asymptomatic. He was placed on Cardizem and is now in sinus rhythm. Cardiology is asked to evaluate. Note his TSH is mildly decreased at 0.311. He has a microcytic anemia. He had mild renal insufficiency on admission which has improved. Atrial fibrillation is most likely related to the stress of his pneumonia. Discontinue IV Cardizem and began 30 mg by mouth every 6. Change to CD tomorrow morning. Will check echocardiogram.  Check free T3 and free T4. He has no embolic risk factors. Based on recent Chest guidelines he does not require anticoagulation or aspirin. We will follow while in house. He needs evaluation of his microcytic anemia which we will leave to primary care.

## 2011-10-23 NOTE — Progress Notes (Signed)
Pt converted to NSR rate 92. BP 99/58. MD notified. MD request I decrease the rate of Cardizem gtt to 5mg /hr . Rate decreased as requested. Will continue to monitor.

## 2011-10-23 NOTE — Progress Notes (Addendum)
Pt still in NSR rate 77. BP 8446. No return call from Merdis Delay, NP. [facility] paged. Awaiting return call. Will continue to monitor.

## 2011-10-23 NOTE — Progress Notes (Signed)
[facility] progress note Chief complaint. Tachycardia. History of present illness. This 46 year old male in hospital with strep pneumonia, nausea and vomiting, acute renal failure. He was noted to have a tachycardia. 12-lead EKG was done earlier and this indicated that atrial flutter with a rate in the 160s. He was given Lopressor 2.5 mg IV and transferred to a telemetry bed. Nursing reports to me that the patient's heart rate on arrival to the telemetry floor still in the 140-160 range. Repeat EKG was done and this indicated atrial fib. Patient is totally asymptomatic without complaints. Specifically he denies any chest pain or dyspnea. He states he has no prior history of any cardiac arrhythmias. He has never seen a cardiologist. Vital signs. Temperature 97.4, pulse 120, respiration 18, blood pressure 95/57. O2 sats 96%. Gen. appearance. Well-developed male in no distress. Cardiac. Irregular and tachycardic. Lungs. Diminished particularly in the right fields. No distress and stable O2 sats. Abdomen. Soft with positive bowel sounds. Extremities. No calf pain negative Homans. Impression/plan. Problem #1. Atrial fib with rapid ventricular response. I did discuss the potential options with the pharmacy. We agree to proceed with a Cardizem drip at 5 mg per hour without a loading bolus. This given his somewhat marginal blood pressure. His heart rate has improved in the 100-120 range. I will defer changing to oral Cardizem to the discretion of the rounding physician.

## 2011-10-23 NOTE — Progress Notes (Signed)
Pt HR sustaining in 170s.  VS temp 98.8 HR 171 Resp 20 BP 119/78.  MD notified.  No new orders received.  Will continue to monitor.

## 2011-10-23 NOTE — Progress Notes (Addendum)
Pt converted to NSR rate 80. BP 94/58. Merdis Delay, NP  text paged. Awaiting return call. Will continue to monitor.

## 2011-10-23 NOTE — Progress Notes (Signed)
Patient seen and completely asymptomatic; will continue cardizen drip and will consult cardiology.

## 2011-10-23 NOTE — Progress Notes (Signed)
Pt HR 110-183 BP 94/50.  Afib.  Cardizem gtt 10 mg/hr.  Asymptomatic.  MD notified.  No new orders.  Will continue to monitor.

## 2011-10-23 NOTE — Progress Notes (Signed)
HR ranging 109-140 with occasional increases to 160's. BP 89/ 39. Pt asymptomatic. MD notified. MD stated "keep the Cardizem rate at 10mg /hr".

## 2011-10-23 NOTE — Progress Notes (Signed)
Pt HR sustaining 171-182.  Asymptomatic.  MD notified.  Will continue to monitor.

## 2011-10-23 NOTE — Progress Notes (Signed)
Pt converted to A-fib, rate 120-140's. BP 95/47. Pt asymptomatic. Cardizem gtt increased to 10mg /hr. MD notified. No new orders received. Will continue to monitor.

## 2011-10-23 NOTE — Progress Notes (Addendum)
Dr. Lurline Idol returning page. Dr. Lurline Idol ordered to leave Cardizem gtt at 10mg /hr and to call her back if pt HR is less than 60 bbm or SBP less than 80. Will continue to monitor.

## 2011-10-23 NOTE — Progress Notes (Signed)
Hr ranging from 110-168 with no consistency. Pt asymptomatic. MD notified and new orders placed in Epic by MD. Will continue to monitor.

## 2011-10-23 NOTE — Progress Notes (Signed)
*  PRELIMINARY RESULTS* Echocardiogram 2D Echocardiogram has been performed.  Glean Salen [facility] 10/23/2011, 2:25 PM

## 2011-10-23 NOTE — Progress Notes (Signed)
Pt HR sustaining 170s.  BP 112/71.  Asymptomatic.  Notified Arguello, PA.  New orders received.  Will continue to monitor.

## 2011-10-23 NOTE — Progress Notes (Signed)
Marva Panda, NP at bedside. Will continue to monitor.

## 2011-10-24 DIAGNOSIS — J189 Pneumonia, unspecified organism: Secondary | ICD-10-CM

## 2011-10-24 DIAGNOSIS — I5032 Chronic diastolic (congestive) heart failure: Secondary | ICD-10-CM | POA: Diagnosis present

## 2011-10-24 LAB — CBC
HCT: 35.7 % — ABNORMAL LOW (ref 39.0–52.0)
MCHC: 35.3 g/dL (ref 30.0–36.0)
MCV: 66.6 fL — ABNORMAL LOW (ref 78.0–100.0)
Platelets: 286 10*3/uL (ref 150–400)
RDW: 14.3 % (ref 11.5–15.5)

## 2011-10-24 LAB — BASIC METABOLIC PANEL
BUN: 9 mg/dL (ref 6–23)
Calcium: 8.2 mg/dL — ABNORMAL LOW (ref 8.4–10.5)
Chloride: 103 mEq/L (ref 96–112)
Creatinine, Ser: 0.84 mg/dL (ref 0.50–1.35)
GFR calc Af Amer: >90 mL/min (ref >90)

## 2011-10-24 LAB — CARDIAC PANEL(CRET KIN+CKTOT+MB+TROPI)
CK, MB: 1.5 ng/mL (ref 0.3–4.0)
Troponin I: <0.3 ng/mL (ref <0.30)

## 2011-10-24 LAB — IRON AND TIBC
Saturation Ratios: 19 % — ABNORMAL LOW (ref 20–55)
UIBC: 140 ug/dL (ref 125–400)

## 2011-10-24 LAB — FOLATE: Folate: 10.3 ng/mL

## 2011-10-24 LAB — RETICULOCYTES: RBC.: 5.54 MIL/uL (ref 4.22–5.81)

## 2011-10-24 LAB — FERRITIN: Ferritin: 745 ng/mL — ABNORMAL HIGH (ref 22–322)

## 2011-10-24 MED ORDER — VITAMIN B-12 1000 MCG PO TABS: 1000.0000 ug | ORAL_TABLET | Freq: Two times a day (BID) | ORAL | Status: AC

## 2011-10-24 MED ORDER — DILTIAZEM HCL ER COATED BEADS 180 MG PO CP24
180.0000 mg | ORAL_CAPSULE | Freq: Every day | ORAL | Status: DC
  Administered 2011-10-24: 180 mg via ORAL
  Filled 2011-10-24: qty 1

## 2011-10-24 MED ORDER — GUAIFENESIN ER 600 MG PO TB12: 1200.0000 mg | ORAL_TABLET | Freq: Two times a day (BID) | ORAL | Status: AC

## 2011-10-24 MED ORDER — MOXIFLOXACIN HCL 400 MG PO TABS: 400.0000 mg | ORAL_TABLET | Freq: Every day | ORAL | Status: AC

## 2011-10-24 MED ORDER — ADULT MULTIVITAMIN W/MINERALS CH: 1.0000 | ORAL_TABLET | Freq: Every day | ORAL | Status: DC

## 2011-10-24 MED ORDER — DILTIAZEM HCL 100 MG IV SOLR
10.0000 mg/h | INTRAVENOUS | Status: DC
  Filled 2011-10-24: qty 100

## 2011-10-24 MED ORDER — DILTIAZEM HCL 90 MG PO TABS: 90.0000 mg | ORAL_TABLET | Freq: Two times a day (BID) | ORAL | Status: DC

## 2011-10-24 NOTE — Progress Notes (Signed)
PROGRESS NOTE  Subjective:   [name] [name] is a 46 yo admitted with Community acquired pneumonia.  Developed A-fib yesterday - converted back to NSR yesterday with IV diltiazem.  Feeling well overnight.  Wants to go home   Objective:    Vital Signs:   Temp:  [98 F (36.7 C)-99 F (37.2 C)] 98.8 F (37.1 C) (04/16 0451) Pulse Rate:  [75-178] 83  (04/16 0451) Resp:  [18-24] 24  (04/16 0451) BP: (84-119)/(46-85) 112/67 mmHg (04/16 0451) SpO2:  [92 %-97 %] 92 % (04/16 0451)  Last BM Date: 10/23/11   24-hour weight change: Weight change:   Weight trends: Filed Weights   10/21/11 2020 10/22/11 2145  Weight: 178 lb 3.2 oz (80.831 kg) 178 lb 9.2 oz (81 kg)    Intake/Output:  04/15 0701 - 04/16 0700 In: 960 [P.O.:960] Out: 2750 [Urine:2750]     Physical Exam: BP 112/67  Pulse 83  Temp(Src) 98.8 F (37.1 C) (Oral)  Resp 24  Ht 5\' 10"  (1.778 m)  Wt 178 lb 9.2 oz (81 kg)  BMI 25.62 kg/m2  SpO2 92%  General: Vital signs reviewed and noted. Well-developed, well-nourished, in no acute distress; alert, appropriate and cooperative throughout examination.  Head: Normocephalic, atraumatic.  Eyes: conjunctivae/corneas clear. PERRL, EOM's intact. Fundi benign.  Throat: Oropharynx nonerythematous, no exudate appreciated.   Neck: Supple. Normal carotids. No JVD  Lungs:  Few rales in bases -  Decreased breath sounds in left base.  Heart: Regular rate,  With normal  S1 S2. No murmurs, rubs, or gallops   Abdomen:  Soft, non-tender, non-distended with normoactive bowel sounds. No hepatomegaly. No rebound/guarding. No abdominal masses.  Extremities: No edema.  Distal pedal pulses are 2+ and equal bilaterally.  Neurologic: A&O X3, CN II - XII are grossly intact. Motor strength is 5/5 in the all 4 extremities.  Psych: Responds to questions appropriately with normal affect.    Labs: BMET:  Basename 10/24/11 0436 10/23/11 0420 10/21/11 2130  NA 134* 134* --  K 3.7 4.0 --  CL 103 104  --  CO2 22 22 --  GLUCOSE 87 80 --  BUN 9 10 --  CREATININE 0.84 0.92 --  CALCIUM 8.2* 8.2* --  MG -- 2.4 1.7  PHOS -- -- 1.5*    Liver function tests:  [facility] 10/21/11 1645  AST 35  ALT 36  ALKPHOS 75  BILITOT 1.9*  PROT 6.7  ALBUMIN 3.1*   No results found for this basename: LIPASE:2,AMYLASE:2 in the last 72 hours  CBC:  Basename 10/24/11 0436 10/23/11 0420 10/21/11 1645  WBC 7.4 13.0* --  NEUTROABS -- -- 14.1*  HGB 12.6* 12.9* --  HCT 35.7* 37.7* --  MCV 66.6* 67.2* --  PLT 286 284 --    Cardiac Enzymes:  Basename 10/24/11 0015 10/23/11 1621 10/23/11 0849  CKTOTAL PENDING 133 182  CKMB 1.5 1.8 1.6  TROPONINI <0.30 <0.30 <0.30    Coagulation Studies: No results found for this basename: LABPROT:5,INR:5 in the last 72 hours  Other: No components found with this basename: POCBNP:3 No results found for this basename: DDIMER in the last 72 hours  Basename 10/21/11 2130  HGBA1C 5.7*   No results found for this basename: CHOL,HDL,LDLCALC,TRIG,CHOLHDL in the last 72 hours  Basename 10/23/11 0849  TSH 1.381  T4TOTAL --  T3FREE --  THYROIDAB --    Basename 10/24/11 0015  VITAMINB12 159*  FOLATE 10.3  FERRITIN 745*  TIBC 173*  IRON 33*  RETICCTPCT 0.7  Tele:  NSR  Medications:    Infusions:    . sodium chloride 100 mL/hr at 10/24/11 0542  . diltiazem (CARDIZEM) infusion 10 mg/hr (10/24/11 0556)  . DISCONTD: diltiazem (CARDIZEM) infusion 10 mg/hr (10/23/11 1007)    Scheduled Medications:    . azithromycin  500 mg Intravenous Q24H  . cefTRIAXone (ROCEPHIN)  IV  1 g Intravenous Q24H  . digoxin  0.25 mg Intravenous Once  . enoxaparin  40 mg Subcutaneous Q24H  . mulitivitamin with minerals  1 tablet Oral Daily  . nicotine  14 mg Transdermal Daily  . phosphorus  500 mg Oral BID  . DISCONTD: digoxin  0.125 mg Oral Daily  . DISCONTD: diltiazem  30 mg Oral Q6H    Assessment/ Plan:    1. A-Fib:  Has maintained NSR.  Will start  Cardizem CD 180 this am.  DC dilt drip at noon.  2. Pneumonia: cont. ABX.  Length of Stay: 3  Vesta Mixer, Montez Hageman., MD, [facility] 10/24/2011, 7:23 AM

## 2011-10-24 NOTE — Progress Notes (Signed)
   CARE MANAGEMENT NOTE 10/24/2011  Patient:  Villwock,Paxten   Account Number:  [id number]  Date Initiated:  10/24/2011  Documentation initiated by:  Lanier Clam  Subjective/Objective Assessment:   ADMITTED W/SOB.     Action/Plan:   FROM HOME W/FAMILY SUPPORT.DOESN'T WORK, NO PCP,HAS TRANSP.   Anticipated DC Date:  10/26/2011   Anticipated DC Plan:  HOME/SELF CARE  In-house referral  Financial Counselor      DC Planning Services  [facility]      Choice offered to / List presented to:             Status of service:  In process, will continue to follow Medicare Important Message given?   (If response is "NO", the following Medicare IM given date fields will be blank) Date Medicare IM given:   Date Additional Medicare IM given:    Discharge Disposition:    Per UR Regulation:  Reviewed for med. necessity/level of care/duration of stay  If discussed at Long Length of Stay Meetings, dates discussed:    Comments:  10/24/11 Sundeep Destin RN,BSN NCM [phone] PROVIDED W/[facility] INFO,APPT SET.PROVIDED W/$4 MED LIST [facility].QUALIFIES FOR INDIGENT FUNDS IF NEEDED.HAS BEEN SEEN B FINANCIAL AID COUNSELOR.

## 2011-10-24 NOTE — Discharge Summary (Signed)
Physician Discharge Summary  Patient ID: [name] [name] MRN: [id number] DOB/AGE: January 17, 1966 46 y.o.  Admit date: 10/21/2011 Discharge date: 10/24/2011  Primary Care Physician:  No primary provider on file.   Discharge Diagnoses:   1-Community acquired bacterial pneumonia 2-SOB (shortness of breath) 3-N&V (nausea and vomiting) 4-ARF (acute renal failure) 5-Tobacco abuse 6-Leukocytosis 7-Fever 8-Atrial flutter 9-Diastolic CHF, chronic 10-B12 deficiency   Medication List  As of 10/24/2011  1:38 PM   STOP taking these medications         ibuprofen 400 MG tablet         TAKE these medications         diltiazem 90 MG tablet   Commonly known as: CARDIZEM   Take 1 tablet (90 mg total) by mouth every 12 (twelve) hours.      guaiFENesin 600 MG 12 hr tablet   Commonly known as: MUCINEX   Take 2 tablets (1,200 mg total) by mouth 2 (two) times daily.      moxifloxacin 400 MG tablet   Commonly known as: AVELOX   Take 1 tablet (400 mg total) by mouth daily.      mulitivitamin with minerals Tabs   Take 1 tablet by mouth daily.      vitamin B-12 1000 MCG tablet   Commonly known as: CYANOCOBALAMIN   Take 1 tablet (1,000 mcg total) by mouth 2 (two) times daily.            Disposition and Follow-up:  Patient discharge in stable and improved condition; currently no complaining of any fever, palpitation, CP or SOB. Patient will use diltiazem as instructed, will finish antibiotics and will follow with [facility] for follow up. He was advised of findings of low B12 and started on B12 PO BID. Patient advised to stop smoking, he is motivated and reported he will quit. Also new diagnosis of diastolic CHF was made (most likely due long heavy hx of alcohol); he was advised to follow a low sodium diet; no extra volume on exam; patient was in fact dehydrated during this hospitalization..  Consults:   Cardiology   Significant Diagnostic Studies:  Dg Chest 2  View  10/21/2011  *RADIOLOGY REPORT*  Clinical Data: Cough.  Fever and shortness of breath.  CHEST - 2 VIEW  Comparison: None.  Findings: The heart size is normal.  New left lower lobe airspace disease is present.  The right lung is clear.  The upper lobes are clear bilaterally.  The visualized soft tissues and bony thorax are unremarkable.  IMPRESSION:  1.  New left lower lobe pneumonia.  Recommend follow-up chest radiograph to assure resolution of this lesion. 2.  The lungs are otherwise clear.  Original Report Authenticated By: Jamesetta Orleans. MATTERN, M.D.   2-D echo 10/24/11  Left ventricle: The cavity size was normal. Wall thickness was normal. Systolic function was normal. The estimated ejection fraction was in the range of 55% to 60%. Features are consistent with a pseudonormal left ventricular filling pattern, with concomitant abnormal relaxation and increased filling pressure (grade 2 diastolic dysfunction).    Brief H and P: 46 y/o male with pmh significant for tobacco abuse; come to ED complaining of general malaise, productive cough and on/off fever for the last 5 days. He reports at the beginning he thought was just a cold and start using OTC cold remedies; but symptoms continue worsen and for the last 48 hours he has not been able to eat or drink due to associated nausea; also  with 1 episode of vomiting and diarrhea.  In the ED he was found dehydrated; with fever of 103; hypoxic with O2 sat in 89% and acute renal failure with Cr of 1.68. CXR demonstrated LLL PNA.   Hospital Course:  1-SOB 2/2 Strep pneumoniae pneumonia: WBC's down to normal; good O2 sat on RA and no further fever. Patient tolerating PO's. Will finish treatment with avelox daily for 7 more days. Patient advised to stop smoking and to continue using spirometer.  2-N&V (nausea and vomiting), Fever and general malaise: associated with #1; resolved at this point. He will use PRN Tylenol if any other fever.   3-ARF  (acute renal failure): pre-renal in origin due to dehydration. Resolved. Creatinine back to normal at discharge.   4-Tobacco abuse: counseling provided. Patient willing to quit; 1-800 quit now info provided. Patient is motivated to quit.   5-Leukocytosis: 2/2 demargination and infection. WNL at discharge, Will keep himself hydrated and will finish antibiotics as instructed.  6-Hx of alcohol and drug abuse: Alcohol level and UDS negative. Patient congratulated for his effort and dedication of keeping himself clean. Encourage to continue doing such a good job and keep himself clean and sober.  7-Hx of first degree member with DM: Hyperglycemia; A1C 5.7; will need lifestyle changes but not medications at this point.   8-Atrial flutter/A. fib: most likely triggered by fever and acute infection. Back to NSR; per cardiology recommendations no anticoagulation needed. Will continue diltiazem as indicated by cardiology.  9-Anemia: chronic and microcytic; anemia panel demonstrating B12 deficiency. Patient started on B12 BID; he will establish and follow with a PCP at [facility].  10-Abnormal TSH: most likely associated with sick thyroid; repeated labs, after his condition was stable, demonstrated normal TSH and normal Free T4.  11-Diastolic CHF: most likely associated with chronic use of alcohol; EF preserved and CHF compensated. He was advised to follow a low sodium diet.  12-B12: started on B12 1000 MCg BID.    Time spent on Discharge: 40 minutes  Signed: Octavio Matheney 10/24/2011, 1:38 PM

## 2011-10-24 NOTE — Progress Notes (Signed)
   CARE MANAGEMENT NOTE 10/24/2011  Patient:  [name],[name]   Account Number:  [id number]  Date Initiated:  10/24/2011  Documentation initiated by:  Carry Weesner  Subjective/Objective Assessment:   ADMITTED W/SOB.     Action/Plan:   FROM HOME W/FAMILY SUPPORT.DOESN'T WORK, NO PCP,HAS TRANSP.   Anticipated DC Date:  10/26/2011   Anticipated DC Plan:  HOME/SELF CARE  In-house referral  Financial Counselor      DC Planning Services  [facility]igent [facility] Clinic  Medication Assistance      Choice offered to / List presented to:             Status of service:  Completed, signed off Medicare Important Message given?   (If response is "NO", the following Medicare IM given date fields will be blank) Date Medicare IM given:   Date Additional Medicare IM given:    Discharge Disposition:  HOME/SELF CARE  Per UR Regulation:  Reviewed for med. necessity/level of care/duration of stay  If discussed at Long Length of Stay Meetings, dates discussed:    Comments:  10/24/11 [name] Sher RN,BSN NCM [phone] HAS USED INDIGENT FUND FOR AVELOX VIA PHARMACY. NSG AWARE.  PROVIDED W/[name] [name] COMM CLINIC INFO,APPT SET.PROVIDED W/$4 MED LIST [facility].QUALIFIES FOR INDIGENT FUNDS IF NEEDED.HAS BEEN SEEN B FINANCIAL AID COUNSELOR.    

## 2011-10-24 NOTE — Discharge Instructions (Signed)
Atrial Flutter  Atrial flutter is a heart rhythm that can cause the heart to beat very fast (tachycardia). It originates in the upper chambers of the heart (atria). In atrial flutter, the top chambers of the heart (atria) often beat much faster than the bottom chambers of the heart (ventricles). Atrial flutter has a regular "saw toothed" appearance in an EKG readout. An EKG is a test that records the electrical activity of the heart. Atrial flutter can cause the heart to beat up to 150 beats per minute (BPM). Atrial flutter can either be short lived (paroxysmal) or permanent.   CAUSES   Causes of atrial flutter can be many. Some of these include:   Heart related issues:   Heart attack (myocardial infarction).   Heart failure.   Heart valve problems.   Poorly controlled high blood pressure (hypertension).   Afteropen heart surgery.   Lung related issues:   A blood clot in the lungs (pulmonary embolism).   Chronic obstructive pulmonary disease (COPD). Medications used to treat COPD can attribute to atrial flutter.   Other related causes:   Hyperthyroidism.   Caffeine.   Some decongestant cold medications.   Low electrolyte levels such as potassium or magnesium.   Cocaine.  SYMPTOMS   An awareness of your heart beating rapidly (palpitations).   Shortness of breath.   Chest pain.   Low blood pressure (hypotension).   Dizziness or fainting.  DIAGNOSIS   Different tests can be performed to diagnose atrial flutter.    An EKG.   Holter monitor. This is a 24 hour recording of your heart rhythm. You will also be given a diary. Write down all symptoms that you have and what you were doing at the time you experienced symptoms.   Cardiac event monitor. This small device can be worn for up to 30 days. When you have heart symptoms, you will push a button on the device. This will then record your heart rhythm.   Echocardiogram. This is an imaging test to look at your heart. Your caregiver will look at your  heart valves and the ventricles.   Stress Test. This test can help determine if the atrial flutter is related to exercise or if coronary artery disease is present.   Laboratory studies will look at certain blood levels like:   Complete blood count (CBC).   Potassium.   Magnesium.   Thyroid function.  TREATMENT   Treatment of atrial flutter varies. A combination of therapies may be used or sometimes atrial flutter may need only 1 type of treatment.   Lab work:  If your blood work, such as your electrolytes (potassium, magnesium) or your thyroid function tests are abnormal, your caregiver will treat them accordingly.   Medication:   There are several different types of medications that can convert your heart to a normal rhythm and prevent atrial flutter from reoccurring.   Nonsurgical procedures:  Nonsurgical techniques may be used to control atrial flutter. Some examples include:   Cardioversion. This technique uses either drugs or an electrical shock to restore a normal heart rhythm:   Cardioversion drugs may be given through an intravenous (IV) line to help "reset" the heart rhythm.   In electrical cardioversion, your caregiver shocks your heart with electrical energy. This helps to reset the heartbeat to a normal rhythm.   Ablation. If atrial flutter is a persistent problem, an ablation may be needed. This procedure is done under mild sedation. High frequency radio-wave energy is used to   IF:   Dizziness.   Near fainting or fainting.   Shortness of breath.   Chest pain or pressure.   Sudden nausea or vomiting.   Profuse sweating.  If you have the above symptoms, call your local emergency service immediately! Do not drive yourself to the hospital. MAKE SURE YOU:   Understand these instructions.   Will watch your condition.   Will get help right away if you  are not doing well or get worse.  Document Released: 11/12/2008 Document Revised: 06/15/2011 Document Reviewed: 11/12/2008 [facility] Patient Information 2012 [place], [place].Pneumonia, Adult Pneumonia is an infection of the lungs.  CAUSES Pneumonia may be caused by bacteria or a virus. Usually, these infections are caused by breathing infectious particles into the lungs (respiratory tract). SYMPTOMS   Cough.   Fever.   Chest pain.   Increased rate of breathing.   Wheezing.   Mucus production.  DIAGNOSIS  If you have the common symptoms of pneumonia, your caregiver will typically confirm the diagnosis with a chest X-ray. The X-ray will show an abnormality in the lung (pulmonary infiltrate) if you have pneumonia. Other tests of your blood, urine, or sputum may be done to find the specific cause of your pneumonia. Your caregiver may also do tests (blood gases or pulse oximetry) to see how well your lungs are working. TREATMENT  Some forms of pneumonia may be spread to other people when you cough or sneeze. You may be asked to wear a mask before and during your exam. Pneumonia that is caused by bacteria is treated with antibiotic medicine. Pneumonia that is caused by the influenza virus may be treated with an antiviral medicine. Most other viral infections must run their course. These infections will not respond to antibiotics.  PREVENTION A pneumococcal shot (vaccine) is available to prevent a common bacterial cause of pneumonia. This is usually suggested for:  People over 50 years old.   Patients on chemotherapy.   People with chronic lung problems, such as bronchitis or emphysema.   People with immune system problems.  If you are over 65 or have a high risk condition, you may receive the pneumococcal vaccine if you have not received it before. In some countries, a routine influenza vaccine is also recommended. This vaccine can help prevent some cases of pneumonia.You may be offered  the influenza vaccine as part of your care. If you smoke, it is time to quit. You may receive instructions on how to stop smoking. Your caregiver can provide medicines and counseling to help you quit. HOME CARE INSTRUCTIONS   Cough suppressants may be used if you are losing too much rest. However, coughing protects you by clearing your lungs. You should avoid using cough suppressants if you can.   Your caregiver may have prescribed medicine if he or she thinks your pneumonia is caused by a bacteria or influenza. Finish your medicine even if you start to feel better.   Your caregiver may also prescribe an expectorant. This loosens the mucus to be coughed up.   Only take over-the-counter or prescription medicines for pain, discomfort, or fever as directed by your caregiver.   Do not smoke. Smoking is a common cause of bronchitis and can contribute to pneumonia. If you are a smoker and continue to smoke, your cough may last several weeks after your pneumonia has cleared.   A cold steam vaporizer or humidifier in your room or home may help loosen mucus.   Coughing is often worse at night. Sleeping  in a semi-upright position in a recliner or using a couple pillows under your head will help with this.   Get rest as you feel it is needed. Your body will usually let you know when you need to rest.  SEEK IMMEDIATE MEDICAL CARE IF:   Your illness becomes worse. This is especially true if you are elderly or weakened from any other disease.   You cannot control your cough with suppressants and are losing sleep.   You begin coughing up blood.   You develop pain which is getting worse or is uncontrolled with medicines.   You have a fever.   Any of the symptoms which initially brought you in for treatment are getting worse rather than better.   You develop shortness of breath or chest pain.  MAKE SURE YOU:   Understand these instructions.   Will watch your condition.   Will get help right  away if you are not doing well or get worse.  Document Released: 06/26/2005 Document Revised: 06/15/2011 Document Reviewed: 09/15/2010 [facility] Patient Information 2012 [place], [place].Pneumonia, Adult Pneumonia is an infection of the lungs.  CAUSES Pneumonia may be caused by bacteria or a virus. Usually, these infections are caused by breathing infectious particles into the lungs (respiratory tract). SYMPTOMS   Cough.   Fever.   Chest pain.   Increased rate of breathing.   Wheezing.   Mucus production.  DIAGNOSIS  If you have the common symptoms of pneumonia, your caregiver will typically confirm the diagnosis with a chest X-ray. The X-ray will show an abnormality in the lung (pulmonary infiltrate) if you have pneumonia. Other tests of your blood, urine, or sputum may be done to find the specific cause of your pneumonia. Your caregiver may also do tests (blood gases or pulse oximetry) to see how well your lungs are working. TREATMENT  Some forms of pneumonia may be spread to other people when you cough or sneeze. You may be asked to wear a mask before and during your exam. Pneumonia that is caused by bacteria is treated with antibiotic medicine. Pneumonia that is caused by the influenza virus may be treated with an antiviral medicine. Most other viral infections must run their course. These infections will not respond to antibiotics.  PREVENTION A pneumococcal shot (vaccine) is available to prevent a common bacterial cause of pneumonia. This is usually suggested for:  People over 62 years old.   Patients on chemotherapy.   People with chronic lung problems, such as bronchitis or emphysema.   People with immune system problems.  If you are over 65 or have a high risk condition, you may receive the pneumococcal vaccine if you have not received it before. In some countries, a routine influenza vaccine is also recommended. This vaccine can help prevent some cases of pneumonia.You may  be offered the influenza vaccine as part of your care. If you smoke, it is time to quit. You may receive instructions on how to stop smoking. Your caregiver can provide medicines and counseling to help you quit. HOME CARE INSTRUCTIONS   Cough suppressants may be used if you are losing too much rest. However, coughing protects you by clearing your lungs. You should avoid using cough suppressants if you can.   Your caregiver may have prescribed medicine if he or she thinks your pneumonia is caused by a bacteria or influenza. Finish your medicine even if you start to feel better.   Your caregiver may also prescribe an expectorant. This loosens the  mucus to be coughed up.   Only take over-the-counter or prescription medicines for pain, discomfort, or fever as directed by your caregiver.   Do not smoke. Smoking is a common cause of bronchitis and can contribute to pneumonia. If you are a smoker and continue to smoke, your cough may last several weeks after your pneumonia has cleared.   A cold steam vaporizer or humidifier in your room or home may help loosen mucus.   Coughing is often worse at night. Sleeping in a semi-upright position in a recliner or using a couple pillows under your head will help with this.   Get rest as you feel it is needed. Your body will usually let you know when you need to rest.  SEEK IMMEDIATE MEDICAL CARE IF:   Your illness becomes worse. This is especially true if you are elderly or weakened from any other disease.   You cannot control your cough with suppressants and are losing sleep.   You begin coughing up blood.   You develop pain which is getting worse or is uncontrolled with medicines.   You have a fever.   Any of the symptoms which initially brought you in for treatment are getting worse rather than better.   You develop shortness of breath or chest pain.  MAKE SURE YOU:   Understand these instructions.   Will watch your condition.   Will get  help right away if you are not doing well or get worse.  Document Released: 06/26/2005 Document Revised: 06/15/2011 Document Reviewed: 09/15/2010 [facility] Patient Information 2012 [place], [place].

## 2012-09-08 ENCOUNTER — Encounter (HOSPITAL_BASED_OUTPATIENT_CLINIC_OR_DEPARTMENT_OTHER): Payer: Self-pay | Admitting: *Deleted

## 2012-09-08 ENCOUNTER — Emergency Department (HOSPITAL_BASED_OUTPATIENT_CLINIC_OR_DEPARTMENT_OTHER)
Admission: EM | Admit: 2012-09-08 | Discharge: 2012-09-08 | Disposition: A | Discharge status: Home / Self Care | Payer: Self-pay | Attending: Emergency Medicine | Admitting: Emergency Medicine

## 2012-09-08 DIAGNOSIS — R21 Rash and other nonspecific skin eruption: Secondary | ICD-10-CM | POA: Insufficient documentation

## 2012-09-08 DIAGNOSIS — F17200 Nicotine dependence, unspecified, uncomplicated: Secondary | ICD-10-CM | POA: Insufficient documentation

## 2012-09-08 DIAGNOSIS — Z79899 Other long term (current) drug therapy: Secondary | ICD-10-CM | POA: Insufficient documentation

## 2012-09-08 DIAGNOSIS — F1410 Cocaine abuse, uncomplicated: Secondary | ICD-10-CM | POA: Insufficient documentation

## 2012-09-08 MED ORDER — TERBINAFINE HCL 1 % EX CREA: TOPICAL_CREAM | Freq: Two times a day (BID) | CUTANEOUS | Status: DC

## 2012-09-08 NOTE — ED Notes (Signed)
Pt states he has had a rash on the bottom of his left foot x 2 months. Has been using creams with some relief, but rash always returns. + itching

## 2012-09-08 NOTE — ED Provider Notes (Signed)
History     CSN: [id number]  Arrival date & time 09/08/12  1301   First MD Initiated Contact with Patient 09/08/12 1331      Chief Complaint  Patient presents with  . Rash    (Consider location/radiation/quality/duration/timing/severity/associated sxs/prior Treatment)  HPI [name] [name] is a 47 y.o. male who presents to the ED with a rash . The rash is located on the bottom of the left foot. Onset was gradual. The rash started several months ago. The rash comes and goes. There is some itching. When he uses antifungal creams the area improves but then comes back. He denies pain, fever, nausea vomiting or other problems. He was evaluated for cardiac problems last year and hospitalized. He was started on medications and took them until he ran out but did not get it refilled like he was to do. He has not noticed any problems. The history was provided by the patient.  Past Medical History  Diagnosis Date  . Substance abuse     Prior hx of cocaine (quit 5 years ago)/EtOH (clean for 14 months) as of 10/2011    History reviewed. No pertinent past surgical history.  Family History  Problem Relation Age of Onset  . Heart disease Neg Hx     History  Substance Use Topics  . Smoking status: Current Every Day Smoker  . Smokeless tobacco: Not on file  . Alcohol Use: No     Comment: Previously drank heavily, has been sober for 14 months      Review of Systems  Constitutional: Negative for fever and chills.  HENT: Negative for neck pain.   Eyes: Negative for visual disturbance.  Respiratory: Negative for cough and chest tightness.   Cardiovascular: Negative for chest pain, palpitations and leg swelling.  Gastrointestinal: Negative for nausea and vomiting.  Musculoskeletal: Negative for back pain.  Skin: Positive for rash.  Neurological: Negative for dizziness, weakness and numbness.  Psychiatric/Behavioral: Negative for confusion. The patient is not nervous/anxious.     Allergies   Review of patient's allergies indicates no known allergies.  Home Medications   Current Outpatient Rx  Name  Route  Sig  Dispense  Refill  . diltiazem (CARDIZEM) 90 MG tablet   Oral   Take 1 tablet (90 mg total) by mouth every 12 (twelve) hours.   60 tablet   1   . guaiFENesin (MUCINEX) 600 MG 12 hr tablet   Oral   Take 2 tablets (1,200 mg total) by mouth 2 (two) times daily.   30 tablet   0   . Multiple Vitamin (MULITIVITAMIN WITH MINERALS) TABS   Oral   Take 1 tablet by mouth daily.         . vitamin B-12 (CYANOCOBALAMIN) 1000 MCG tablet   Oral   Take 1 tablet (1,000 mcg total) by mouth 2 (two) times daily.   60 tablet   5     BP 121/79  Pulse 68  Temp(Src) 98 F (36.7 C) (Oral)  Resp 18  Ht 5\' 10"  (1.778 m)  Wt 190 lb (86.183 kg)  BMI 27.26 kg/m2  SpO2 98%  Physical Exam  Nursing note and vitals reviewed. Constitutional: He is oriented to person, place, and time. He appears well-developed and well-nourished. No distress.  HENT:  Head: Normocephalic and atraumatic.  Eyes: EOM are normal. Pupils are equal, round, and reactive to light.  Neck: Neck supple.  Cardiovascular: Normal rate.   Pulmonary/Chest: Effort normal.  Musculoskeletal:  Feet:  Rash noted plantar aspect of left foot. No erythema or drainage or signs of infection. Rash consistent with tinea.  Neurological: He is alert and oriented to person, place, and time. No cranial nerve deficit.  Skin: Skin is warm and dry.  Psychiatric: He has a normal mood and affect. His behavior is normal. Judgment and thought content normal.   Assessment: 47 y.o. male with Tinea Pedis    Hx of cardiac problems - stopped medication  Plan:  Lamisil Cream   Follow up with dermatology    Refill medication and follow up with Cardiologist   Discussed with the patient and all questioned fully answered.    Medication List    TAKE these medications       terbinafine 1 % cream  Commonly known as:  LAMISIL  AT  Apply topically 2 (two) times daily.      ASK your doctor about these medications       diltiazem 90 MG tablet  Commonly known as:  CARDIZEM  Take 1 tablet (90 mg total) by mouth every 12 (twelve) hours.     guaiFENesin 600 MG 12 hr tablet  Commonly known as:  MUCINEX  Take 2 tablets (1,200 mg total) by mouth 2 (two) times daily.     multivitamin with minerals Tabs  Take 1 tablet by mouth daily.     vitamin B-12 1000 MCG tablet  Commonly known as:  CYANOCOBALAMIN  Take 1 tablet (1,000 mcg total) by mouth 2 (two) times daily.        Procedures      Janne Napoleon, NP 09/09/12 1040

## 2012-09-10 NOTE — ED Provider Notes (Signed)
Medical screening examination/treatment/procedure(s) were performed by non-physician practitioner and as supervising physician I was immediately available for consultation/collaboration.    Vida Roller, MD 09/10/12 [phone]

## 2013-03-24 ENCOUNTER — Encounter: Payer: Self-pay | Admitting: *Deleted

## 2013-03-24 ENCOUNTER — Ambulatory Visit (INDEPENDENT_AMBULATORY_CARE_PROVIDER_SITE_OTHER): Payer: No Typology Code available for payment source | Admitting: Cardiology

## 2013-03-24 VITALS — BP 130/68 | HR 62 | Ht 70.0 in | Wt 203.8 lb

## 2013-03-24 DIAGNOSIS — F1910 Other psychoactive substance abuse, uncomplicated: Secondary | ICD-10-CM | POA: Insufficient documentation

## 2013-03-24 DIAGNOSIS — I5030 Unspecified diastolic (congestive) heart failure: Secondary | ICD-10-CM | POA: Insufficient documentation

## 2013-03-24 DIAGNOSIS — I4892 Unspecified atrial flutter: Secondary | ICD-10-CM

## 2013-03-24 MED ORDER — DILTIAZEM HCL 90 MG PO TABS: 90.0000 mg | ORAL_TABLET | Freq: Two times a day (BID) | ORAL | Status: DC

## 2013-03-24 NOTE — Progress Notes (Signed)
Patient ID: Antar Milks, male   DOB: [birth year], 47 y.o.   MRN: [id number]    Patient Name: [name] [name] Date of Encounter: 03/24/2013  Primary Care Provider:  No primary provider on file. Primary Cardiologist:  Tobias Alexander, MD  Patient Profile  Follow up for atrial flutter  Problem List   Past Medical History  Diagnosis Date  . Substance abuse     Prior hx of cocaine (quit 5 years ago)/EtOH (clean for 14 months) as of 10/2011  . Atrial flutter   . Diastolic CHF, chronic   . Community acquired bacterial pneumonia   . SOB (shortness of breath)   . N&V (nausea and vomiting)   . ARF (acute renal failure)   . Leukocytosis    No past surgical history on file.  Allergies  No Known Allergies  HPI  A 47 year old male with prior h/o smoking, cocaine use and etoh use who was hospitalized for an episode of atrial flutter with RVR in April 2013. He was found to be in heart failure with preserved ejection at the time. The patient was sent home on Diltiazem, but he was not using it as he lost insurance. He recently obtained insurance, was seen by a PCP who referred him to [place] for a follow up. He has been using Diltiazem for the last couple of weeks. He denies any palpitations, chest pain, SOB. He is fairly active and doesn't feel limited in his daily activities.    Home Medications  Prior to Admission medications   Medication Sig Start Date End Date Taking? Authorizing Provider  diltiazem (CARDIZEM) 90 MG tablet Take 1 tablet (90 mg total) by mouth every 12 (twelve) hours. 10/24/11 03/24/13 Yes Vassie Loll, MD  ibuprofen (ADVIL,MOTRIN) 800 MG tablet Take 800 mg by mouth every 8 (eight) hours as needed for pain.   Yes Historical Provider, MD    Family History  Family History  Problem Relation Age of Onset  . Heart disease Neg Hx     Social History  History   Social History  . Marital Status: Single    Spouse Name: N/A    Number of Children: N/A  . Years of  Education: N/A   Occupational History  . Not on file.   Social History Main Topics  . Smoking status: Former Games developer  . Smokeless tobacco: Not on file     Comment: Quit April 2013  . Alcohol Use: No     Comment: Previously drank heavily, has been sober for 14 months  . Drug Use: No     Comment: As of 10/2011, has been clean from crack cocaine for 5 years. Hx of marijuana.  . Sexual Activity: Not on file   Other Topics Concern  . Not on file   Social History Narrative  . No narrative on file     Review of Systems General:  No chills, fever, night sweats or weight changes.  Cardiovascular:  No chest pain, dyspnea on exertion, edema, orthopnea, palpitations, paroxysmal nocturnal dyspnea. Dermatological: No rash, lesions/masses Respiratory: No cough, dyspnea Urologic: No hematuria, dysuria Abdominal:   No nausea, vomiting, diarrhea, bright red blood per rectum, melena, or hematemesis Neurologic:  No visual changes, wkns, changes in mental status. All other systems reviewed and are otherwise negative except as noted above.  Physical Exam  Blood pressure 130/68, pulse 62, height 5\' 10"  (1.778 m), weight 203 lb 12.8 oz (92.443 kg).  General: Pleasant, NAD Psych: Normal affect. Neuro: Alert and oriented  X 3. Moves all extremities spontaneously. HEENT: Normal  Neck: Supple without bruits or JVD. Lungs:  Resp regular and unlabored, CTA. Heart: RRR no s3, s4, or murmurs. Abdomen: Soft, non-tender, non-distended, BS + x 4.  Extremities: No clubbing, cyanosis or edema. DP/PT/Radials 2+ and equal bilaterally.  Accessory Clinical Findings   Left ventricle: The cavity size was normal. Wall thickness was normal. Systolic function was normal. The estimated ejection fraction was in the range of 55% to 60%. Features are consistent with a pseudonormal left ventricular filling pattern, with concomitant abnormal relaxation and increased filling pressure (grade 2 diastolic  dysfunction).  ------------------------------------------------------------ Aortic valve: Mildly thickened leaflets. Doppler: No regurgitation.  ------------------------------------------------------------ Mitral valve: Structurally normal valve. Leaflet separation was normal. Doppler: Transvalvular velocity was within the normal range. There was no evidence for stenosis. Trivial regurgitation.  ------------------------------------------------------------ Left atrium: The atrium was normal in size.  ------------------------------------------------------------ Right ventricle: The cavity size was normal. Wall thickness was normal. Systolic function was normal.  ------------------------------------------------------------ Pulmonic valve: Structurally normal valve. Cusp separation was normal. Doppler: Transvalvular velocity was within the normal range. No regurgitation.  ------------------------------------------------------------ Tricuspid valve: Structurally normal valve. Leaflet separation was normal. Doppler: Transvalvular velocity was within the normal range. Trivial regurgitation.  ------------------------------------------------------------ Right atrium: The atrium was mildly dilated.  ------------------------------------------------------------ Pericardium: There was no pericardial effusion.  ------------------------------------------------------------ Systemic veins: Inferior vena cava: The vessel was normal in size; the respirophasic diameter changes were in the normal range (= 50%); findings are consistent with normal central venous pressure.    Assessment & Plan  A 47 year old patient with prior h/o HTN and poly-substance abuse here for follow up after 18 months.  1. Atrial flutter - no more episodes, continue Diltiazem PO  2. Heart failure with preserved ejection fraction - the exacerbation in 4/13 secondary to a flutter with RVR. Currently stable,  compensated, no need for diuretics.  3. HTN - controlled, continue Diltiazem  Refill for Diltiazem given.  Follow up in 6 months.    Tobias Alexander, Rexene Edison, MD 03/24/2013, 4:16 PM

## 2013-03-24 NOTE — Patient Instructions (Addendum)
**Note De-identified  Obfuscation** Your physician recommends that you continue on your current medications as directed. Please refer to the Current Medication list given to you today.  Your physician wants you to follow-up in: 6 months. You will receive a reminder letter in the mail two months in advance. If you don't receive a letter, please call our office to schedule the follow-up appointment.  

## 2013-11-15 ENCOUNTER — Encounter (HOSPITAL_COMMUNITY): Payer: Self-pay | Admitting: Emergency Medicine

## 2013-11-15 ENCOUNTER — Emergency Department (HOSPITAL_COMMUNITY)
Admission: EM | Admit: 2013-11-15 | Discharge: 2013-11-16 | Disposition: A | Discharge status: Home / Self Care | Payer: No Typology Code available for payment source | Attending: Emergency Medicine | Admitting: Emergency Medicine

## 2013-11-15 DIAGNOSIS — Z791 Long term (current) use of non-steroidal anti-inflammatories (NSAID): Secondary | ICD-10-CM | POA: Insufficient documentation

## 2013-11-15 DIAGNOSIS — Z87891 Personal history of nicotine dependence: Secondary | ICD-10-CM | POA: Insufficient documentation

## 2013-11-15 DIAGNOSIS — Z79899 Other long term (current) drug therapy: Secondary | ICD-10-CM | POA: Insufficient documentation

## 2013-11-15 DIAGNOSIS — I4892 Unspecified atrial flutter: Secondary | ICD-10-CM | POA: Insufficient documentation

## 2013-11-15 DIAGNOSIS — Z862 Personal history of diseases of the blood and blood-forming organs and certain disorders involving the immune mechanism: Secondary | ICD-10-CM | POA: Insufficient documentation

## 2013-11-15 DIAGNOSIS — I5032 Chronic diastolic (congestive) heart failure: Secondary | ICD-10-CM | POA: Insufficient documentation

## 2013-11-15 DIAGNOSIS — K625 Hemorrhage of anus and rectum: Secondary | ICD-10-CM | POA: Insufficient documentation

## 2013-11-15 DIAGNOSIS — Z8701 Personal history of pneumonia (recurrent): Secondary | ICD-10-CM | POA: Insufficient documentation

## 2013-11-15 DIAGNOSIS — Z87448 Personal history of other diseases of urinary system: Secondary | ICD-10-CM | POA: Insufficient documentation

## 2013-11-15 LAB — BASIC METABOLIC PANEL
BUN: 16 mg/dL (ref 6–23)
CALCIUM: 9 mg/dL (ref 8.4–10.5)
CHLORIDE: 102 meq/L (ref 96–112)
CO2: 24 meq/L (ref 19–32)
CREATININE: 1.24 mg/dL (ref 0.50–1.35)
GFR calc Af Amer: 80 mL/min — ABNORMAL LOW (ref >90)
GFR calc non Af Amer: 69 mL/min — ABNORMAL LOW (ref >90)
GLUCOSE: 97 mg/dL (ref 70–99)
Potassium: 4 mEq/L (ref 3.7–5.3)
Sodium: 139 mEq/L (ref 137–147)

## 2013-11-15 LAB — CBC WITH DIFFERENTIAL/PLATELET
BASOS ABS: 0 10*3/uL (ref 0.0–0.1)
Basophils Relative: 0 % (ref 0–1)
EOS PCT: 3 % (ref 0–5)
Eosinophils Absolute: 0.2 10*3/uL (ref 0.0–0.7)
HEMATOCRIT: 41.7 % (ref 39.0–52.0)
HEMOGLOBIN: 14.1 g/dL (ref 13.0–17.0)
LYMPHS ABS: 3 10*3/uL (ref 0.7–4.0)
LYMPHS PCT: 47 % — AB (ref 12–46)
MCH: 23 pg — ABNORMAL LOW (ref 26.0–34.0)
MCHC: 33.8 g/dL (ref 30.0–36.0)
MCV: 68 fL — AB (ref 78.0–100.0)
MONO ABS: 0.5 10*3/uL (ref 0.1–1.0)
MONOS PCT: 8 % (ref 3–12)
Neutro Abs: 2.6 10*3/uL (ref 1.7–7.7)
Neutrophils Relative %: 42 % — ABNORMAL LOW (ref 43–77)
Platelets: 227 10*3/uL (ref 150–400)
RBC: 6.13 MIL/uL — ABNORMAL HIGH (ref 4.22–5.81)
RDW: 14.7 % (ref 11.5–15.5)
WBC: 6.3 10*3/uL (ref 4.0–10.5)

## 2013-11-15 LAB — POC OCCULT BLOOD, ED: Fecal Occult Bld: POSITIVE — AB

## 2013-11-15 NOTE — ED Provider Notes (Signed)
CSN: [id number]     Arrival date & time 11/15/13  2122 History   First MD Initiated Contact with Patient 11/15/13 2228     Chief Complaint  Patient presents with  . Rectal Bleeding   HPI  [name] [name] is a 48 y.o. male with a PMH of substance abuse, atrial flutter, CHF, and ARF who presents to the ED for evaluation of rectal bleeding. History was provided by the patient. Patient states he has had rectal bleeding with bowel movements for the past week. Started as spotting with whipping but has increased in volume and may be mixed in with stool. No rectal hemorrhage or spontaneous bleed. No trauma or injuries. Has hx of external hemorrhoids in the past. Denies any constipation or straining with bowel movements. No rectal pain, abdominal pain, nausea, emesis, lightheadedness, fever, or other concerns. Take Ibuprofen PRN but denies any significant NSAID use. No blood thinners.    Past Medical History  Diagnosis Date  . Substance abuse     Prior hx of cocaine (quit 5 years ago)/EtOH (clean for 14 months) as of 10/2011  . Atrial flutter   . Diastolic CHF, chronic   . Community acquired bacterial pneumonia   . SOB (shortness of breath)   . N&V (nausea and vomiting)   . ARF (acute renal failure)   . Leukocytosis    History reviewed. No pertinent past surgical history. Family History  Problem Relation Age of Onset  . Heart disease Neg Hx    History  Substance Use Topics  . Smoking status: Former Games developermoker  . Smokeless tobacco: Not on file     Comment: Quit April 2013  . Alcohol Use: No     Comment: Previously drank heavily, has been sober for 14 months    Review of Systems  Constitutional: Negative for fever, chills, diaphoresis, activity change, appetite change and fatigue.  Respiratory: Negative for cough and shortness of breath.   Cardiovascular: Negative for chest pain and leg swelling.  Gastrointestinal: Positive for blood in stool and anal bleeding. Negative for nausea, vomiting,  abdominal pain, diarrhea, constipation and rectal pain.  Genitourinary: Negative for dysuria, hematuria and difficulty urinating.  Musculoskeletal: Negative for back pain.  Skin: Negative for wound.  Neurological: Negative for dizziness, weakness, light-headedness and headaches.    Allergies  Review of patient's allergies indicates no known allergies.  Home Medications   Prior to Admission medications   Medication Sig Start Date End Date Taking? Authorizing Provider  diltiazem (CARDIZEM) 90 MG tablet Take 1 tablet (90 mg total) by mouth every 12 (twelve) hours. 03/24/13 08/23/14  Lars MassonKatarina H Nelson, MD  ibuprofen (ADVIL,MOTRIN) 800 MG tablet Take 800 mg by mouth every 8 (eight) hours as needed for pain.    Historical Provider, MD   BP 134/109  Pulse 81  Temp(Src) 98.1 F (36.7 C) (Oral)  Resp 16  Ht 5\' 10"  (1.778 m)  Wt 200 lb (90.719 kg)  BMI 28.70 kg/m2  SpO2 97%  Filed Vitals:   11/15/13 2146 11/15/13 2313  BP: 134/109 130/76  Pulse: 81 72  Temp: 98.1 F (36.7 C) 98.2 F (36.8 C)  TempSrc: Oral Oral  Resp: 16 15  Height: 5\' 10"  (1.778 m)   Weight: 200 lb (90.719 kg)   SpO2: 97% 97%    Physical Exam  Nursing note and vitals reviewed. Constitutional: He is oriented to person, place, and time. He appears well-developed and well-nourished. No distress.  HENT:  Head: Normocephalic and atraumatic.  Right  Ear: External ear normal.  Left Ear: External ear normal.  Nose: Nose normal.  Mouth/Throat: Oropharynx is clear and moist.  Eyes: Conjunctivae are normal. Right eye exhibits no discharge. Left eye exhibits no discharge.  Neck: Normal range of motion. Neck supple.  Cardiovascular: Normal rate, regular rhythm and normal heart sounds.  Exam reveals no gallop and no friction rub.   No murmur heard. Pulmonary/Chest: Effort normal and breath sounds normal. No respiratory distress. He has no wheezes. He has no rales. He exhibits no tenderness.  Abdominal: Soft. Bowel sounds  are normal. He exhibits no distension and no mass. There is no tenderness. There is no rebound and no guarding.  Musculoskeletal: Normal range of motion. He exhibits no edema and no tenderness.  Neurological: He is alert and oriented to person, place, and time.  Skin: Skin is warm and dry. He is not diaphoretic. No pallor.     ED Course  Procedures (including critical care time) Labs Review Labs Reviewed  CBC WITH DIFFERENTIAL  BASIC METABOLIC PANEL  OCCULT BLOOD X 1 CARD TO LAB, STOOL    Imaging Review No results found.   EKG Interpretation None      Results for orders placed during the hospital encounter of 11/15/13  CBC WITH DIFFERENTIAL      Result Value Ref Range   WBC 6.3  4.0 - 10.5 K/uL   RBC 6.13 (*) 4.22 - 5.81 MIL/uL   Hemoglobin 14.1  13.0 - 17.0 g/dL   HCT 16.1  09.6 - 04.5 %   MCV 68.0 (*) 78.0 - 100.0 fL   MCH 23.0 (*) 26.0 - 34.0 pg   MCHC 33.8  30.0 - 36.0 g/dL   RDW 40.9  81.1 - 91.4 %   Platelets 227  150 - 400 K/uL   Neutrophils Relative % 42 (*) 43 - 77 %   Neutro Abs 2.6  1.7 - 7.7 K/uL   Lymphocytes Relative 47 (*) 12 - 46 %   Lymphs Abs 3.0  0.7 - 4.0 K/uL   Monocytes Relative 8  3 - 12 %   Monocytes Absolute 0.5  0.1 - 1.0 K/uL   Eosinophils Relative 3  0 - 5 %   Eosinophils Absolute 0.2  0.0 - 0.7 K/uL   Basophils Relative 0  0 - 1 %   Basophils Absolute 0.0  0.0 - 0.1 K/uL  BASIC METABOLIC PANEL      Result Value Ref Range   Sodium 139  137 - 147 mEq/L   Potassium 4.0  3.7 - 5.3 mEq/L   Chloride 102  96 - 112 mEq/L   CO2 24  19 - 32 mEq/L   Glucose, Bld 97  70 - 99 mg/dL   BUN 16  6 - 23 mg/dL   Creatinine, Ser 7.82  0.50 - 1.35 mg/dL   Calcium 9.0  8.4 - 95.6 mg/dL   GFR calc non Af Amer 69 (*) >90 mL/min   GFR calc Af Amer 80 (*) >90 mL/min  POC OCCULT BLOOD, ED      Result Value Ref Range   Fecal Occult Bld POSITIVE (*) NEGATIVE     MDM   [name] [name] is a 48 y.o. male with a PMH of substance abuse, atrial flutter,  CHF, and ARF who presents to the ED for evaluation of rectal bleeding. Patient asymptomatic with no rectal or abdominal pain. Occult positive stool. No gross blood. Rectal and abdominal exam benign. H&H and vital signs are  stable. Source of GI bleeding unclear. Gave patient referral to GI. Will likely need colonoscopy. Informed patient of possible causes of rectal/GI bleeding including colon cancer. Return precautions, discharge instructions, and follow-up was discussed with the patient before discharge.    Rechecks  11:40 PM = No external hemorrhoids or anal fissures. No gross blood on exam. Small brown soft stool in rectal vault. No internal palpable hemorrhoids.  12:15 AM = Patient asymptomatic. Discussed return precautions and plan.    Discharge Medication List as of 11/16/2013 12:19 AM       Final impressions: 1. Rectal bleeding      Luiz IronJessica Katlin Naseer Hearn PA-C   This patient was discussed with Dr. Azell DerJames         Carlean Crowl K Sibley Rolison, PA-C 11/16/13 1243

## 2013-11-15 NOTE — ED Notes (Signed)
Patient is alert and oriented x3.  He is complaining of rectal bleeding that he states is bright red over The last week.  He denies any pain during his BM or during wiping .  He denies having this issue before. He has a history of hemorrhoids.

## 2013-11-15 NOTE — ED Notes (Signed)
POCT HEMOCULT RESULTED POS.

## 2013-11-16 NOTE — Discharge Instructions (Signed)
Drink fluids and rest Follow-up with a GI specialist - you will likely need a colonoscopy to evaluate for a source of bleeding (colon cancer cannot be excluded at this time and must be further evaluated) Return to the emergency department if you develop any changing/worsening condition, fever, abdominal pain, feeling lightheaded, passing out, rectal hemorrhage, or any other concerns (please read additional information regarding your condition below)    Bloody Stools Bloody stools often mean that there is a problem in the digestive tract. Your caregiver may use the term "melena" to describe black, tarry, and bad smelling stools or "hematochezia" to describe red or maroon-colored stools. Blood seen in the stool can be caused by bleeding anywhere along the intestinal tract.  A black stool usually means that blood is coming from the upper part of the gastrointestinal tract (esophagus, stomach, or small bowel). Passing maroon-colored stools or bright red blood usually means that blood is coming from lower down in the large bowel or the rectum. However, sometimes massive bleeding in the stomach or small intestine can cause bright red bloody stools.  Consuming black licorice, lead, iron pills, medicines containing bismuth subsalicylate, or blueberries can also cause black stools. Your caregiver can test black stools to see if blood is present. It is important that the cause of the bleeding be found. Treatment can then be started, and the problem can be corrected. Rectal bleeding may not be serious, but you should not assume everything is okay until you know the cause.It is very important to follow up with your caregiver or a specialist in gastrointestinal problems. CAUSES  Blood in the stools can come from various underlying causes.Often, the cause is not found during your first visit. Testing is often needed to discover the cause of bleeding in the gastrointestinal tract. Causes range from simple to serious  or even life-threatening.Possible causes include:  Hemorrhoids.These are veins that are full of blood (engorged) in the rectum. They cause pain, inflammation, and may bleed.  Anal fissures.These are areas of painful tearing which may bleed. They are often caused by passing hard stool.  Diverticulosis.These are pouches that form on the colon over time, with age, and may bleed significantly.  Diverticulitis.This is inflammation in areas with diverticulosis. It can cause pain, fever, and bloody stools, although bleeding is rare.  Proctitis and colitis. These are inflamed areas of the rectum or colon. They may cause pain, fever, and bloody stools.  Polyps and cancer. Colon cancer is a leading cause of preventable cancer death.It often starts out as precancerous polyps that can be removed during a colonoscopy, preventing progression into cancer. Sometimes, polyps and cancer may cause rectal bleeding.  Gastritis and ulcers.Bleeding from the upper gastrointestinal tract (near the stomach) may travel through the intestines and produce black, sometimes tarry, often bad smelling stools. In certain cases, if the bleeding is fast enough, the stools may not be black, but red and the condition may be life-threatening. SYMPTOMS  You may have stools that are bright red and bloody, that are normal color with blood on them, or that are dark black and tarry. In some cases, you may only have blood in the toilet bowl. Any of these cases need medical care. You may also have:  Pain at the anus or anywhere in the rectum.  Lightheadedness or feeling faint.  Extreme weakness.  Nausea or vomiting.  Fever. DIAGNOSIS Your caregiver may use the following methods to find the cause of your bleeding:  Taking a medical history. Age is  important. Older people tend to develop polyps and cancer more often. If there is anal pain and a hard, large stool associated with bleeding, a tear of the anus may be the cause. If  blood drips into the toilet after a bowel movement, bleeding hemorrhoids may be the problem. The color and frequency of the bleeding are additional considerations. In most cases, the medical history provides clues, but seldom the final answer.  A visual and finger (digital) exam. Your caregiver will inspect the anal area, looking for tears and hemorrhoids. A finger exam can provide information when there is tenderness or a growth inside. In men, the prostate is also examined.  Endoscopy. Several types of small, long scopes (endoscopes) are used to view the colon.  In the office, your caregiver may use a rigid, or more commonly, a flexible viewing sigmoidoscope. This exam is called flexible sigmoidoscopy. It is performed in 5 to 10 minutes.  A more thorough exam is accomplished with a colonoscope. It allows your caregiver to view the entire 5 to 6 foot long colon. Medicine to help you relax (sedative) is usually given for this exam. Frequently, a bleeding lesion may be present beyond the reach of the sigmoidoscope. So, a colonoscopy may be the best exam to start with. Both exams are usually done on an outpatient basis. This means the patient does not stay overnight in the hospital or surgery center.  An upper endoscopy may be needed to examine your stomach. Sedation is used and a flexible endoscope is put in your mouth, down to your stomach.  A barium enema X-ray. This is an X-ray exam. It uses liquid barium inserted by enema into the rectum. This test alone may not identify an actual bleeding point. X-rays highlight abnormal shadows, such as those made by lumps (tumors), diverticuli, or colitis. TREATMENT  Treatment depends on the cause of your bleeding.   For bleeding from the stomach or colon, the caregiver doing your endoscopy or colonoscopy may be able to stop the bleeding as part of the procedure.  Inflammation or infection of the colon can be treated with medicines.  Many rectal problems  can be treated with creams, suppositories, or warm baths.  Surgery is sometimes needed.  Blood transfusions are sometimes needed if you have lost a lot of blood.  For any bleeding problem, let your caregiver know if you take aspirin or other blood thinners regularly. HOME CARE INSTRUCTIONS   Take any medicines exactly as prescribed.  Keep your stools soft by eating a diet high in fiber. Prunes (1 to 3 a day) work well for many people.  Drink enough water and fluids to keep your urine clear or pale yellow.  Take sitz baths if advised. A sitz bath is when you sit in a bathtub with warm water for 10 to 15 minutes to soak, soothe, and cleanse the rectal area.  If enemas or suppositories are advised, be sure you know how to use them. Tell your caregiver if you have problems with this.  Monitor your bowel movements to look for signs of improvement or worsening. SEEK MEDICAL CARE IF:   You do not improve in the time expected.  Your condition worsens after initial improvement.  You develop any new symptoms. SEEK IMMEDIATE MEDICAL CARE IF:   You develop severe or prolonged rectal bleeding.  You vomit blood.  You feel weak or faint.  You have a fever. MAKE SURE YOU:  Understand these instructions.  Will watch your condition.  Will get help right away if you are not doing well or get worse. Document Released: 06/16/2002 Document Revised: 09/18/2011 Document Reviewed: 11/11/2010 [facility] Patient Information 2014 [place], [place].   Emergency Department Resource Guide 1) Find a Doctor and Pay Out of Pocket Although you won't have to find out who is covered by your insurance plan, it is a good idea to ask around and get recommendations. You will then need to call the office and see if the doctor you have chosen will accept you as a new patient and what types of options they offer for patients who are self-pay. Some doctors offer discounts or will set up payment plans for their  patients who do not have insurance, but you will need to ask so you aren't surprised when you get to your appointment.  2) Contact Your Local Health Department Not all health departments have doctors that can see patients for sick visits, but many do, so it is worth a call to see if yours does. If you don't know where your local health department is, you can check in your phone book. The CDC also has a tool to help you locate your state's health department, and many state websites also have listings of all of their local health departments.  3) Find a Walk-in Clinic If your illness is not likely to be very severe or complicated, you may want to try a walk in clinic. These are popping up all over the country in pharmacies, drugstores, and shopping centers. They're usually staffed by nurse practitioners or physician assistants that have been trained to treat common illnesses and complaints. They're usually fairly quick and inexpensive. However, if you have serious medical issues or chronic medical problems, these are probably not your best option.  No Primary Care Doctor: - Call Health Connect at  [phone] - they can help you locate a primary care doctor that  accepts your insurance, provides certain services, etc. - Physician Referral Service- [phone]  Chronic Pain Problems: Organization         Address  Phone   Notes  [facility]  [phone] Patients need to be referred by their primary care doctor.   Medication Assistance: Organization         Address  Phone   Notes  [facility] [address] [place], [place] [address] [phone] --Must be a resident of [facility] -- Must have NO insurance coverage whatsoever (no Medicaid/ Medicare, etc.) -- The pt. MUST have a primary care doctor that directs their care regularly and follows them in the community   Med[facility]ist  [phone]   [employer]  [phone]) [phone]     Agencies that provide inexpensive medical care: Organization         Address  Phone   Notes  [facility]  [phone]   [facility]    [phone]   [facility] [address] [place], [place] [address] [phone]   [facility] [address], [place] [phone]) [phone]   [facility]anned Parenthood    [phone](336) [phone]   [facility]    [phone]) [phone]   [facility]  [address], [place] Phone:  [phone](336) [phone], Fax:  [phone] Hours of Operation:  9 am - 6 pm, M-F.  Also accepts Medicaid/Medicare and self-pay.  [facility]  [address], [place] Phone: [phone], Fax: [phone]. Hours of Operation:  8:30 am - 5:30 pm, M-F.  Also accepts Medicaid and self-pay.  [facility] [place] [address] [place] Phone: [phone]   [facility] [address] Natasha Bence [place], [place] [phone], Ext. [phone] Mondays & Thursdays: 7-9 AM.  First 15 patients are seen on a first come, first serve basis.    Medicaid-accepting [facility] Providers:  Organization         Address  Phone   Notes  [facility] [address], [place] [phone] Also accepts self-pay patients.  [facility] [address] Laurell Josephs [place], [place]  [phone]   [facility] [address], [place] [phone]   [facility] [address], [place] [phone]   Renaye Rakers [address], [place]   [phone] Only accepts [place] patients after they have their name applied to their card.   Self-Pay (no insurance) in [facility]:  Organization         Address  Phone   Notes  Sickle Cell Patients, [facility] [address] [place], [place] [phone]    [facility] [address] [place], [place] [phone]   [facility] [place]  [address], [place] [phone]   [facility]/Dr. Osei-Bonsu  [address] [place] or [address], [place] [phone] Phone number for both [place] and [place] locations is the same.  [facility] [address] [place] [phone]   [facility] [address] [place] or [address] [phone]   [facility] [address], [place] [phone], phone; [phone], fax Sees patients 1st and 3rd Saturday of every month.  Must not qualify for public or private insurance (i.e. Medicaid, Medicare, Central Health Choice, Veterans' Benefits)  Household income should be no more than 200% of the poverty level The clinic cannot treat you if you are pregnant or think you are pregnant  Sexually transmitted diseases are not treated at the clinic.    Dental Care: Organization         Address  Phone  Notes  [facility] [address] [place], [place] [phone] Accepts children up to age 69 who are enrolled in [place] or [place] Health Choice; pregnant women with a Medicaid card; and children who have applied for Medicaid or [place] Health Choice, but were declined, whose parents can pay a reduced fee at time of service.  [facility] Department of [facility]  [address], [place] [phone] Accepts children up to age 39 who are enrolled in [place] or [place] Health Choice; pregnant women with a Medicaid card; and children who have applied for Medicaid or [place] Health Choice, but were declined, whose parents can pay a reduced fee at time of service.  [facility]  [address] [place], [place] [phone] Patients are seen by  appointment only. Walk-ins are not accepted. [facility] will see patients 80 years of age and older. Monday - Tuesday (8am-5pm) Most Wednesdays (8:30-5pm) $30 per visit, cash only  [employer] [facility] [employer]  PROGRAM  [address], [facility] [phone] Patients are seen by appointment only. Walk-ins are not accepted. [facility] will see patients 23 years of age and older. One Wednesday Evening (Monthly: Volunteer Based).  $30 per visit, cash only  [employer]  [phone] for adults; Children under age 45, call Graduate Pediatric Dentistry at [phone]. Children aged 65-14, please call [phone] to request a pediatric application.  Dental services are provided in all areas of dental care including fillings, crowns and bridges, complete and partial dentures, implants, gum treatment, root canals, and extractions. Preventive care is also provided. Treatment is provided to both adults and children. Patients are selected via a lottery and there is often a waiting list.   [facility] [address], [place]  [phone] [email or web address]   [facility] [address] [place], [place] [phone], Ext. [phone] Second and Fourth Thursday of each month, opens at 6:30 AM; Clinic ends at 9 AM.  Patients are seen on a first-come first-served basis, and a limited number are seen during each clinic.   [facility]  [address] Ether Griffins [place], [place] [phone]   Eligibility Requirements You must have lived in [place], [place], or [place] counties for at least the last three months.   You cannot be eligible for state or federal sponsored [place], including [employer], [place], or [employer].   You generally cannot be eligible for healthcare insurance through your employer.    How to apply: Eligibility screenings are held every Tuesday and Wednesday afternoon from 1:00 pm until 4:00 pm. You  do not need an appointment for the interview!  [facility] [address], [place], [place] [phone]   [facility] Health [facility]  [phone]   [facility] Department  [phone]   [facility] Health Department  [phone]    Behavioral Health Resources in the Community: Intensive Outpatient Programs Organization         Address  Phone  Notes  [facility] [address], [place], [place] [phone]   [facility] [address] [place], [place] [phone]   [facility] [address] [place], [place]  [phone]   [facility] Mental Health [address]  [place], [place] [phone] or [phone]   Substance Abuse Resources Organization         Address  Phone  Notes  [facility] Drug Services  [phone]   [facility]  [phone]   [facility]  [phone]   Floydene Flock  [phone]   Residential & Outpatient Substance Abuse Program  [phone]   Psychological Services Organization         Address  Phone  Notes  [facility] Behavioral Health  [phone]   [facility] Services  [phone]   [facility] [address], [place] [phone] or [phone]    Mobile Crisis Teams Organization         Address  Phone  Notes  [facility], Mobile Crisis Care Unit  [phone]   [facility]  [address] [place], [place] [phone]   Doristine Locks [address] [place] [phone]    Self-Help/Support Groups Organization         Address  Phone             Notes  Mental Health Assoc. of  [place] - variety of support groups  336- [phone] Call for more information  Narcotics Anonymous (NA), [facility] [address], [place]  2 meetings at this location   Statisticianesidential Treatment Programs Organization          Address  Phone  Notes  [facility] [address],    [place]  [phone]   [facility]  [address], [place] [phone], [place], [place] [phone]   [facility] [address], [place] [phone] Admissions: 8am-3pm M-F  [facility] [address]    [place], [place] [phone]   [facility] [address], [place], [place] [phone]   [facility] [address]  [place], [place] [phone]   [facility] - Intensive Outpatient [address], [place], [place] [phone]   [facility] ([facility] [address]  [place], [place] [phone] or [phone]   [facility] ([facility]) [address] [place], [place] [phone] Accepts Medicaid  [facility] [address]  [place] [phone] Substance Abuse/Addiction Treatment   [facility] Behavioral Health Resources Organization         Address  Phone  Notes  [facility]  [phone](888) [phone]   Angie FavaJulie Brannon, PhD [address], [address] [place], [place]   [phone]) [phone] or [phone](336) [phone]   [facility] [place]   [address] [place], [place] [phone]) [phone]   [facility] [address], [place], [place] [phone](336) [phone] Insurance/Medicaid/sponsorship through [facility]  [facility] [address]                                    [place], [place] [phone](336) [phone] Therapy/tele-psych/case  [facility] [address]   [place], [place] [phone](336) [phone]    Dr. Lolly MustacheArfeen  [phone]) [phone]   [facility]  [employer] [facility] 1) [address], [place] 2) [address], [place] 3)  [address], [place] [phone](336) [phone]) [phone]  [phone](336) [phone]   [facility] Child Abuse Hotline [phone](336) [phone] or [phone]) [phone] (After Hours)

## 2013-11-19 NOTE — ED Provider Notes (Signed)
Medical screening examination/treatment/procedure(s) were performed by non-physician practitioner and as supervising physician I was immediately available for consultation/collaboration.   EKG Interpretation None        Gracee Ratterree, MD 11/19/13 0707 

## 2015-01-15 ENCOUNTER — Emergency Department (HOSPITAL_BASED_OUTPATIENT_CLINIC_OR_DEPARTMENT_OTHER)
Admission: EM | Admit: 2015-01-15 | Discharge: 2015-01-15 | Disposition: A | Discharge status: Home / Self Care | Payer: No Typology Code available for payment source | Attending: Emergency Medicine | Admitting: Emergency Medicine

## 2015-01-15 ENCOUNTER — Encounter (HOSPITAL_BASED_OUTPATIENT_CLINIC_OR_DEPARTMENT_OTHER): Payer: Self-pay | Admitting: Emergency Medicine

## 2015-01-15 DIAGNOSIS — Y9289 Other specified places as the place of occurrence of the external cause: Secondary | ICD-10-CM | POA: Insufficient documentation

## 2015-01-15 DIAGNOSIS — Z87448 Personal history of other diseases of urinary system: Secondary | ICD-10-CM | POA: Insufficient documentation

## 2015-01-15 DIAGNOSIS — I4892 Unspecified atrial flutter: Secondary | ICD-10-CM | POA: Insufficient documentation

## 2015-01-15 DIAGNOSIS — Z862 Personal history of diseases of the blood and blood-forming organs and certain disorders involving the immune mechanism: Secondary | ICD-10-CM | POA: Insufficient documentation

## 2015-01-15 DIAGNOSIS — Y9389 Activity, other specified: Secondary | ICD-10-CM | POA: Insufficient documentation

## 2015-01-15 DIAGNOSIS — Z87891 Personal history of nicotine dependence: Secondary | ICD-10-CM | POA: Insufficient documentation

## 2015-01-15 DIAGNOSIS — X58XXXA Exposure to other specified factors, initial encounter: Secondary | ICD-10-CM | POA: Insufficient documentation

## 2015-01-15 DIAGNOSIS — Y998 Other external cause status: Secondary | ICD-10-CM | POA: Insufficient documentation

## 2015-01-15 DIAGNOSIS — Z8701 Personal history of pneumonia (recurrent): Secondary | ICD-10-CM | POA: Insufficient documentation

## 2015-01-15 DIAGNOSIS — S86911A Strain of unspecified muscle(s) and tendon(s) at lower leg level, right leg, initial encounter: Secondary | ICD-10-CM | POA: Insufficient documentation

## 2015-01-15 DIAGNOSIS — Z79899 Other long term (current) drug therapy: Secondary | ICD-10-CM | POA: Insufficient documentation

## 2015-01-15 DIAGNOSIS — I5032 Chronic diastolic (congestive) heart failure: Secondary | ICD-10-CM | POA: Insufficient documentation

## 2015-01-15 DIAGNOSIS — T148XXA Other injury of unspecified body region, initial encounter: Secondary | ICD-10-CM

## 2015-01-15 MED ORDER — IBUPROFEN 400 MG PO TABS
600.0000 mg | ORAL_TABLET | Freq: Once | ORAL | Status: AC
  Administered 2015-01-15: 600 mg via ORAL
  Filled 2015-01-15 (×2): qty 1

## 2015-01-15 MED ORDER — METHOCARBAMOL 500 MG PO TABS: 1000.0000 mg | ORAL_TABLET | Freq: Three times a day (TID) | ORAL | Status: DC | PRN

## 2015-01-15 MED ORDER — METHOCARBAMOL 500 MG PO TABS
1000.0000 mg | ORAL_TABLET | Freq: Once | ORAL | Status: AC
  Administered 2015-01-15: 1000 mg via ORAL
  Filled 2015-01-15: qty 2

## 2015-01-15 MED ORDER — IBUPROFEN 600 MG PO TABS: 600.0000 mg | ORAL_TABLET | Freq: Four times a day (QID) | ORAL | Status: DC | PRN

## 2015-01-15 NOTE — ED Notes (Signed)
Patient states that he is having pain to his right lateral side of his leg since July 5th. Reports that he had pain with walking and driving

## 2015-01-15 NOTE — ED Provider Notes (Signed)
CSN: [id number]     Arrival date & time 01/15/15  2219 History  This chart was scribed for [name] [name] [name], [name] [name] [name] by Octavia Heir, ED Scribe. This patient was seen in room MH05/MH05 and the patient's care was started at 11:20 PM.    Chief Complaint  Patient presents with  . Leg Pain      Patient is a 49 y.o. [name] [name] presenting with leg pain. The history is provided by the patient. No language interpreter was used.  Leg Pain Associated symptoms: no fever    HPI Comments: [name] [name] [name] is a 49 y.o. [name] [name] who presents to the Emergency Department complaining of constant, gradual worsening right lower leg pain onset 4 days ago. He reports associated pain while driving and ambulating. Pt notes working for a [employer] but denies any known injury. No numbness. swelling or tenderness. No extended travel or surgeries.  Past Medical History  Diagnosis Date  . Substance abuse     Prior hx of cocaine (quit 5 years ago)/EtOH (clean for 14 months) as of 10/2011  . Atrial flutter   . Diastolic CHF, chronic   . Community acquired bacterial pneumonia   . SOB (shortness of breath)   . N&V (nausea and vomiting)   . ARF (acute renal failure)   . Leukocytosis    History reviewed. No pertinent past surgical history. Family History  Problem Relation Age of Onset  . Heart disease Neg Hx    History  Substance Use Topics  . Smoking status: Former Games developer  . Smokeless tobacco: Not on file     Comment: Quit April 2013  . Alcohol Use: No     Comment: Previously drank heavily, has been sober for 14 months    Review of Systems  Constitutional: Negative for fever and chills.  Respiratory: Negative for shortness of breath.   Cardiovascular: Negative for chest pain.  Gastrointestinal: Negative for nausea, vomiting and abdominal pain.  Musculoskeletal: Positive for myalgias. Negative for arthralgias.  Skin: Negative for rash and wound.  Neurological: Negative for dizziness, weakness, light-headedness,  numbness and headaches.  All other systems reviewed and are negative.     Allergies  Review of patient's allergies indicates no known allergies.  Home Medications   Prior to Admission medications   Medication Sig Start Date End Date Taking? Authorizing Provider  diltiazem (CARDIZEM) 90 MG tablet Take 1 tablet (90 mg total) by mouth every 12 (twelve) hours. 03/24/13 08/23/14  Lars Masson, [name] [name] [name]  ibuprofen (ADVIL,MOTRIN) 600 MG tablet Take 1 tablet (600 mg total) by mouth every 6 (six) hours as needed. 01/15/15   [name] [name] [name], [name] [name] [name]  methocarbamol (ROBAXIN) 500 MG tablet Take 2 tablets (1,000 mg total) by mouth every 8 (eight) hours as needed for muscle spasms. 01/15/15   [name] [name] [name], [name] [name] [name]   Triage vitals: BP 153/74 mmHg  Pulse 72  Temp(Src) 97.5 F (36.4 C) (Oral)  Resp 16  Ht  (1.778 m)  Wt 210 lb (95.255 kg)  BMI 30.13 kg/m2  SpO2 100% Physical Exam  Constitutional: He is oriented to person, place, and time. He appears well-developed and well-nourished. No distress.  HENT:  Head: Normocephalic and atraumatic.  Eyes: EOM are normal. Pupils are equal, round, and reactive to light.  Neck: Normal range of motion. Neck supple.  Cardiovascular: Normal rate and regular rhythm.   Pulmonary/Chest: Effort normal and breath sounds normal. No respiratory distress. He has no wheezes. He has no rales. He exhibits no tenderness.  Abdominal: Soft.  Bowel sounds are normal.  Musculoskeletal: Normal range of motion. He exhibits tenderness. He exhibits no edema.  Patient with normal range of motion to the right knee and right ankle. 2+ dorsalis pedis pulses. Patient does have tenderness to palpation over the right tibialis anterior muscle. There is no obvious trauma. No swelling or deformity. No warmth.  Neurological: He is alert and oriented to person, place, and time.  5/5 motor in all troches. Sensation is fully intact. Ambulating without difficulty.  Skin: Skin is warm and dry. No rash  noted. No erythema.  Psychiatric: He has a normal mood and affect. His behavior is normal.  Nursing note and vitals reviewed.   ED Course  Procedures  DIAGNOSTIC STUDIES: Oxygen Saturation is 100% on RA, normal by my interpretation.  COORDINATION OF CARE:  11:22 PM Discussed treatment plan which includes anti-inflammatory, heat compress with pt at bedside and pt agreed to plan.  Labs Review Labs Reviewed - No data to display  Imaging Review No results found.   EKG Interpretation None      MDM   Final diagnoses:  Muscle strain    I personally performed the services described in this documentation, which was scribed in my presence. The recorded information has been reviewed and is accurate.  With tenderness over the tibialis anterior. Likely strain. We'll treat with NSAIDs and heat. Return precautions given.  [name] Raceravid Amilliana Hayworth, [name] [name] [name] 01/16/15 [phone]

## 2015-01-15 NOTE — ED Notes (Addendum)
Care assumed at time of d/c. Pt seen by EDP prior to RN assessment, see MD notes, orders received to medicate and d/c. Pt not seen by this RN. Pt seen and d/c'd by other RN.

## 2015-01-15 NOTE — Discharge Instructions (Signed)

## 2015-02-08 ENCOUNTER — Emergency Department (HOSPITAL_COMMUNITY)
Admission: EM | Admit: 2015-02-08 | Discharge: 2015-02-08 | Disposition: A | Discharge status: Home / Self Care | Payer: No Typology Code available for payment source | Attending: Emergency Medicine | Admitting: Emergency Medicine

## 2015-02-08 ENCOUNTER — Encounter (HOSPITAL_COMMUNITY): Payer: Self-pay | Admitting: Emergency Medicine

## 2015-02-08 DIAGNOSIS — I4892 Unspecified atrial flutter: Secondary | ICD-10-CM | POA: Insufficient documentation

## 2015-02-08 DIAGNOSIS — Z87891 Personal history of nicotine dependence: Secondary | ICD-10-CM | POA: Insufficient documentation

## 2015-02-08 DIAGNOSIS — N179 Acute kidney failure, unspecified: Secondary | ICD-10-CM | POA: Insufficient documentation

## 2015-02-08 DIAGNOSIS — I5032 Chronic diastolic (congestive) heart failure: Secondary | ICD-10-CM | POA: Insufficient documentation

## 2015-02-08 DIAGNOSIS — Z8709 Personal history of other diseases of the respiratory system: Secondary | ICD-10-CM | POA: Insufficient documentation

## 2015-02-08 DIAGNOSIS — Z862 Personal history of diseases of the blood and blood-forming organs and certain disorders involving the immune mechanism: Secondary | ICD-10-CM | POA: Insufficient documentation

## 2015-02-08 DIAGNOSIS — M5440 Lumbago with sciatica, unspecified side: Secondary | ICD-10-CM | POA: Insufficient documentation

## 2015-02-08 MED ORDER — PREDNISONE 20 MG PO TABS: 40.0000 mg | ORAL_TABLET | Freq: Every day | ORAL | Status: DC

## 2015-02-08 NOTE — Discharge Instructions (Signed)
Back Pain, Adult Low back pain is very common. About 1 in 5 people have back pain.The cause of low back pain is rarely dangerous. The pain often gets better over time.About half of people with a sudden onset of back pain feel better in just 2 weeks. About 8 in 10 people feel better by 6 weeks.  CAUSES Some common causes of back pain include:  Strain of the muscles or ligaments supporting the spine.  Wear and tear (degeneration) of the spinal discs.  Arthritis.  Direct injury to the back. DIAGNOSIS Most of the time, the direct cause of low back pain is not known.However, back pain can be treated effectively even when the exact cause of the pain is unknown.Answering your caregiver's questions about your overall health and symptoms is one of the most accurate ways to make sure the cause of your pain is not dangerous. If your caregiver needs more information, he or she may order lab work or imaging tests (X-rays or MRIs).However, even if imaging tests show changes in your back, this usually does not require surgery. HOME CARE INSTRUCTIONS For many people, back pain returns.Since low back pain is rarely dangerous, it is often a condition that people can learn to manageon their own.   Remain active. It is stressful on the back to sit or stand in one place. Do not sit, drive, or stand in one place for more than 30 minutes at a time. Take short walks on level surfaces as soon as pain allows.Try to increase the length of time you walk each day.  Do not stay in bed.Resting more than 1 or 2 days can delay your recovery.  Do not avoid exercise or work.Your body is made to move.It is not dangerous to be active, even though your back may hurt.Your back will likely heal faster if you return to being active before your pain is gone.  Pay attention to your body when you bend and lift. Many people have less discomfortwhen lifting if they bend their knees, keep the load close to their bodies,and  avoid twisting. Often, the most comfortable positions are those that put less stress on your recovering back.  Find a comfortable position to sleep. Use a firm mattress and lie on your side with your knees slightly bent. If you lie on your back, put a pillow under your knees.  Only take over-the-counter or prescription medicines as directed by your caregiver. Over-the-counter medicines to reduce pain and inflammation are often the most helpful.Your caregiver may prescribe muscle relaxant drugs.These medicines help dull your pain so you can more quickly return to your normal activities and healthy exercise.  Put ice on the injured area.  Put ice in a plastic bag.  Place a towel between your skin and the bag.  Leave the ice on for 15-20 minutes, 03-04 times a day for the first 2 to 3 days. After that, ice and heat may be alternated to reduce pain and spasms.  Ask your caregiver about trying back exercises and gentle massage. This may be of some benefit.  Avoid feeling anxious or stressed.Stress increases muscle tension and can worsen back pain.It is important to recognize when you are anxious or stressed and learn ways to manage it.Exercise is a great option. SEEK MEDICAL CARE IF:  You have pain that is not relieved with rest or medicine.  You have pain that does not improve in 1 week.  You have new symptoms.  You are generally not feeling well. SEEK   IMMEDIATE MEDICAL CARE IF:   You have pain that radiates from your back into your legs.  You develop new bowel or bladder control problems.  You have unusual weakness or numbness in your arms or legs.  You develop nausea or vomiting.  You develop abdominal pain.  You feel faint. Document Released: 06/26/2005 Document Revised: 12/26/2011 Document Reviewed: 10/28/2013 ExitCare Patient Information 2015 [employer]. This information is not intended to replace advice given to you by your health care provider. Make sure you  discuss any questions you have with your health care provider.  

## 2015-02-08 NOTE — ED Provider Notes (Signed)
CSN: [id number]     Arrival date & time 02/08/15  1520 History  This chart was scribed for non-physician practitioner, Roxy Horseman, PA-C, working with Alvira Monday, MD by Charline Bills, ED Scribe. This patient was seen in room WTR7/WTR7 and the patient's care was started at 4:19 PM.   Chief Complaint  Patient presents with  . Back Pain   The history is provided by the patient. No language interpreter was used.   HPI Comments: [name] [name] is a 49 y.o. male, with a h/o chronic back pain, who presents to the Emergency Department with a chief complaint of intermittent lower back pain for several years. Pt reports constant, moderate pain that radiates across his lower back and is exacerbated with movement and ambulating. He denies fall or injury. Pt also denies urinary or bowel incontinence and numbness in lower extremities. No IV drug use. No h/o DM, HIV or hepatitis. No h/o back surgeries.   Past Medical History  Diagnosis Date  . Substance abuse     Prior hx of cocaine (quit 5 years ago)/EtOH (clean for 14 months) as of 10/2011  . Atrial flutter   . Diastolic CHF, chronic   . Community acquired bacterial pneumonia   . SOB (shortness of breath)   . N&V (nausea and vomiting)   . ARF (acute renal failure)   . Leukocytosis    History reviewed. No pertinent past surgical history. Family History  Problem Relation Age of Onset  . Heart disease Neg Hx    History  Substance Use Topics  . Smoking status: Former Games developer  . Smokeless tobacco: Not on file     Comment: Quit April 2013  . Alcohol Use: No     Comment: Previously drank heavily, has been sober for 14 months    Review of Systems  Musculoskeletal: Positive for back pain.  Neurological: Negative for numbness.   Allergies  Review of patient's allergies indicates no known allergies.  Home Medications   Prior to Admission medications   Medication Sig Start Date End Date Taking? Authorizing Provider  diltiazem  (CARDIZEM) 90 MG tablet Take 1 tablet (90 mg total) by mouth every 12 (twelve) hours. 03/24/13 08/23/14  Lars Masson, MD  ibuprofen (ADVIL,MOTRIN) 600 MG tablet Take 1 tablet (600 mg total) by mouth every 6 (six) hours as needed. 01/15/15   Loren Racer, MD  methocarbamol (ROBAXIN) 500 MG tablet Take 2 tablets (1,000 mg total) by mouth every 8 (eight) hours as needed for muscle spasms. 01/15/15   Loren Racer, MD   BP 121/71 mmHg  Pulse 77  Temp(Src) 97.7 F (36.5 C) (Oral)  Resp 18  Ht  (1.778 m)  Wt 220 lb (99.791 kg)  BMI 31.57 kg/m2  SpO2 96% Physical Exam  Constitutional: He is oriented to person, place, and time. He appears well-developed and well-nourished. No distress.  HENT:  Head: Normocephalic and atraumatic.  Eyes: Conjunctivae and EOM are normal. Right eye exhibits no discharge. Left eye exhibits no discharge. No scleral icterus.  Neck: Normal range of motion. Neck supple. No tracheal deviation present.  Cardiovascular: Normal rate, regular rhythm and normal heart sounds.  Exam reveals no gallop and no friction rub.   No murmur heard. Pulmonary/Chest: Effort normal and breath sounds normal. No respiratory distress. He has no wheezes.  Abdominal: Soft. He exhibits no distension. There is no tenderness.  Musculoskeletal: Normal range of motion.  Lumbar paraspinal muscles tender to palpation, no bony tenderness, step-offs, or gross abnormality  or deformity of spine, patient is able to ambulate, moves all extremities  Bilateral great toe extension intact Bilateral plantar/dorsiflexion intact  Neurological: He is alert and oriented to person, place, and time. He has normal reflexes.  Sensation and strength intact bilaterally Symmetrical reflexes  Skin: Skin is warm and dry. He is not diaphoretic.  Psychiatric: He has a normal mood and affect. His behavior is normal. Judgment and thought content normal.  Nursing note and vitals reviewed.  ED Course  Procedures  (including critical care time) DIAGNOSTIC STUDIES: Oxygen Saturation is 96% on RA, adequate by my interpretation.    COORDINATION OF CARE: 4:27 PM-Discussed treatment plan which includes Prednisone and follow-up with ortho with pt at bedside and pt agreed to plan.   Labs Review Labs Reviewed - No data to display  Imaging Review No results found.   EKG Interpretation None      MDM   Final diagnoses:  Low back pain with sciatica, sciatica laterality unspecified, unspecified back pain laterality    Patient with back pain.  No neurological deficits and normal neuro exam.  Patient is ambulatory.  No loss of bowel or bladder control.  Doubt cauda equina.  Denies fever,  doubt epidural abscess or other lesion. Recommend back exercises, stretching, RICE, and will treat with a short course of prednisone.    Encouraged the patient that there could be a need for additional workup and/or imaging such as MRI, if the symptoms do not resolve. Patient advised that if the back pain does not resolve, or radiates, this could progress to more serious conditions and is encouraged to follow-up with PCP or orthopedics within 2 weeks.     I personally performed the services described in this documentation, which was scribed in my presence. The recorded information has been reviewed and is accurate.     Roxy Horseman, PA-C 02/08/15 1657  Alvira Monday, MD 02/09/15 1258

## 2015-02-08 NOTE — ED Notes (Signed)
Patient states he has lower back pain.  Pain does not radiate down his legs.  Patient states the pain is ongoing since 2005.  He denies any trouble urnating.  No falls or injuries.

## 2015-09-01 ENCOUNTER — Emergency Department (HOSPITAL_COMMUNITY): Payer: 59

## 2015-09-01 ENCOUNTER — Emergency Department (HOSPITAL_COMMUNITY)
Admission: EM | Admit: 2015-09-01 | Discharge: 2015-09-01 | Disposition: A | Discharge status: Home / Self Care | Payer: 59 | Attending: Emergency Medicine | Admitting: Emergency Medicine

## 2015-09-01 ENCOUNTER — Encounter (HOSPITAL_COMMUNITY): Payer: Self-pay

## 2015-09-01 DIAGNOSIS — J069 Acute upper respiratory infection, unspecified: Secondary | ICD-10-CM | POA: Insufficient documentation

## 2015-09-01 DIAGNOSIS — K219 Gastro-esophageal reflux disease without esophagitis: Secondary | ICD-10-CM | POA: Diagnosis not present

## 2015-09-01 DIAGNOSIS — I5032 Chronic diastolic (congestive) heart failure: Secondary | ICD-10-CM | POA: Insufficient documentation

## 2015-09-01 DIAGNOSIS — Z87891 Personal history of nicotine dependence: Secondary | ICD-10-CM | POA: Insufficient documentation

## 2015-09-01 DIAGNOSIS — Z87448 Personal history of other diseases of urinary system: Secondary | ICD-10-CM | POA: Insufficient documentation

## 2015-09-01 DIAGNOSIS — R0981 Nasal congestion: Secondary | ICD-10-CM | POA: Diagnosis present

## 2015-09-01 DIAGNOSIS — Z862 Personal history of diseases of the blood and blood-forming organs and certain disorders involving the immune mechanism: Secondary | ICD-10-CM | POA: Insufficient documentation

## 2015-09-01 DIAGNOSIS — I4892 Unspecified atrial flutter: Secondary | ICD-10-CM | POA: Diagnosis not present

## 2015-09-01 DIAGNOSIS — Z8701 Personal history of pneumonia (recurrent): Secondary | ICD-10-CM | POA: Insufficient documentation

## 2015-09-01 DIAGNOSIS — Z7952 Long term (current) use of systemic steroids: Secondary | ICD-10-CM | POA: Insufficient documentation

## 2015-09-01 MED ORDER — ESOMEPRAZOLE MAGNESIUM 40 MG PO CPDR: 40.0000 mg | DELAYED_RELEASE_CAPSULE | Freq: Every day | ORAL | Status: DC

## 2015-09-01 NOTE — ED Provider Notes (Signed)
CSN: [id number]     Arrival date & time 09/01/15  1333 History  By signing my name below, I, [name] [name], attest that this documentation has been prepared under the direction and in the presence of [facility], PA-C. Electronically Signed: Placido [name], ED Scribe. 09/01/2015. 3:57 PM.    Chief Complaint  Patient presents with  . Nasal Congestion  . Chest Pain   The history is provided by the patient. No language interpreter was used.    HPI Comments: [name] [name] is a 50 y.o. male with a PMHx including diastolic CHF, atrial flutter who presents to the Emergency Department complaining of constant, waxing and waning, moderate, sinus and chest congestion onset 3 weeks ago. He reports associated dry cough, mild, diffuse, CP that occurs only with coughing, intermittent subjective fevers (99.1 F in triage) and post nasal drip. He reports taking multiple OTC cold and sinus medications with short term relief. Not a current smoker.    Past Medical History  Diagnosis Date  . Substance abuse     Prior hx of cocaine (quit 5 years ago)/EtOH (clean for 14 months) as of 10/2011  . Atrial flutter (HCC)   . Diastolic CHF, chronic (HCC)   . Community acquired bacterial pneumonia   . SOB (shortness of breath)   . N&V (nausea and vomiting)   . ARF (acute renal failure) (HCC)   . Leukocytosis    History reviewed. No pertinent past surgical history. Family History  Problem Relation Age of Onset  . Heart disease Neg Hx    Social History  Substance Use Topics  . Smoking status: Former Games developer  . Smokeless tobacco: None     Comment: Quit April 2013  . Alcohol Use: No     Comment: Previously drank heavily, has been sober for 14 months    Review of Systems  Constitutional: Positive for fever. Negative for chills.  HENT: Positive for congestion, postnasal drip and sinus pressure. Negative for sore throat.   Eyes: Negative for visual disturbance.  Respiratory: Positive for cough.  Negative for shortness of breath and wheezing.   Cardiovascular: Positive for chest pain. Negative for palpitations and leg swelling.  Gastrointestinal: Negative for nausea, vomiting and abdominal pain.  Genitourinary: Negative for dysuria.  Musculoskeletal: Negative for back pain and neck pain.  Skin: Negative for rash.  Neurological: Negative for dizziness, weakness and headaches.    Allergies  Review of patient's allergies indicates no known allergies.  Home Medications   Prior to Admission medications   Medication Sig Start Date End Date Taking? Authorizing Provider  diltiazem (CARDIZEM) 90 MG tablet Take 1 tablet (90 mg total) by mouth every 12 (twelve) hours. 03/24/13 08/23/14  Lars Masson, MD  esomeprazole (NEXIUM) 40 MG capsule Take 1 capsule (40 mg total) by mouth daily. 09/01/15   Chase Picket Shivam Mestas, PA-C  ibuprofen (ADVIL,MOTRIN) 600 MG tablet Take 1 tablet (600 mg total) by mouth every 6 (six) hours as needed. 01/15/15   Loren Racer, MD  methocarbamol (ROBAXIN) 500 MG tablet Take 2 tablets (1,000 mg total) by mouth every 8 (eight) hours as needed for muscle spasms. 01/15/15   Loren Racer, MD  predniSONE (DELTASONE) 20 MG tablet Take 2 tablets (40 mg total) by mouth daily. 02/08/15   Roxy Horseman, PA-C   BP 116/76 mmHg  Pulse 102  Temp(Src) 99.1 F (37.3 C) (Oral)  Resp 18  SpO2 99%    Physical Exam  Constitutional: He is oriented to person, place, and  time. He appears well-developed and well-nourished.  HENT:  Head: Normocephalic and atraumatic.  OP with erythema, no exudates. +PND  Eyes: EOM are normal.  Neck: Normal range of motion.  Cardiovascular: Normal rate, regular rhythm and normal heart sounds.  Exam reveals no gallop and no friction rub.   No murmur heard. Pulmonary/Chest: Effort normal and breath sounds normal. No respiratory distress. He has no wheezes. He has no rales. He exhibits no tenderness.  Abdominal: Soft. He exhibits no distension.  There is no tenderness.  Musculoskeletal: Normal range of motion.  Lymphadenopathy:    He has no cervical adenopathy.  Neurological: He is alert and oriented to person, place, and time.  Skin: Skin is warm and dry.  Nursing note and vitals reviewed.   ED Course  Procedures  DIAGNOSTIC STUDIES: Oxygen Saturation is 96% on RA, normal by my interpretation.    COORDINATION OF CARE: 3:55 PM Discussed next steps with pt. He verbalized understanding and is agreeable with the plan.   Labs Review Labs Reviewed - No data to display  Imaging Review Dg Chest 2 View  09/01/2015  CLINICAL DATA:  Cough and congestion for 3 weeks. Initial encounter. EXAM: CHEST  2 VIEW COMPARISON:  PA and lateral chest 10/21/2011 and 09/05/2004. FINDINGS: The lungs are clear. Heart size is normal. There is no pneumothorax or pleural effusion. No bony abnormality is identified. IMPRESSION: Negative exam. Electronically Signed   By: Drusilla Kanner M.D.   On: 09/01/2015 14:09   I have personally reviewed and evaluated these images and lab results as part of my medical decision-making.   EKG Interpretation None      MDM   Final diagnoses:  URI (upper respiratory infection)  Gastroesophageal reflux disease, esophagitis presence not specified   [name] [name] is afebrile (99.1), non-toxic appearing with a clear lung exam. Mild rhinorrhea and OP with erythema, no exudates. Likely viral URI. Patient complaining of chest pain but states it occurs only with coughing and is diffuse across chest wall. Unlikely of cardiac etiology. CXR with no acute cardiopulmonary disease. Patient is agreeable to symptomatic treatment with close follow up with PCP as needed but spoke at length about emergent, changing, or worsening of symptoms that should prompt return to ER. Patient voices understanding and is agreeable to plan.   Patient also requesting medication refill for his nexium. States he is out and when he doesn't take it,  he has burning epigastric pain after eating, but with compliance symptoms improve. Rx written and patient encouraged to follow up with PCP.   I personally performed the services described in this documentation, which was scribed in my presence. The recorded information has been reviewed and is accurate.   [facility] Antonique Langford, PA-C 09/01/15 [id number]  Nelva Nay, MD 09/02/15 1435

## 2015-09-01 NOTE — ED Notes (Addendum)
Pt with cough/congestion, chest pain x 3 weeks.  Denies fever checked at home.  Nausea with emesis x 1 this morning

## 2015-09-01 NOTE — Discharge Instructions (Signed)
1. Medications: mucinex, continue usual home medications 2. Treatment: rest, drink plenty of fluids, take tylenol or ibuprofen for fever control 3. Follow Up: Please follow up with your primary doctor in 3 days for discussion of your diagnoses and further evaluation after today's visit; if you do not have a primary care doctor call the number of your insurance card for help finding one; Return to the ER for high fevers, difficulty breathing or other concerning symptoms

## 2015-09-25 ENCOUNTER — Emergency Department (HOSPITAL_COMMUNITY)
Admission: EM | Admit: 2015-09-25 | Discharge: 2015-09-25 | Disposition: A | Discharge status: Home / Self Care | Payer: 59 | Attending: Emergency Medicine | Admitting: Emergency Medicine

## 2015-09-25 ENCOUNTER — Encounter (HOSPITAL_COMMUNITY): Payer: Self-pay | Admitting: Emergency Medicine

## 2015-09-25 DIAGNOSIS — I5032 Chronic diastolic (congestive) heart failure: Secondary | ICD-10-CM | POA: Insufficient documentation

## 2015-09-25 DIAGNOSIS — Z8679 Personal history of other diseases of the circulatory system: Secondary | ICD-10-CM | POA: Diagnosis not present

## 2015-09-25 DIAGNOSIS — J029 Acute pharyngitis, unspecified: Secondary | ICD-10-CM | POA: Diagnosis not present

## 2015-09-25 DIAGNOSIS — Z7952 Long term (current) use of systemic steroids: Secondary | ICD-10-CM | POA: Diagnosis not present

## 2015-09-25 DIAGNOSIS — Z87891 Personal history of nicotine dependence: Secondary | ICD-10-CM | POA: Diagnosis not present

## 2015-09-25 DIAGNOSIS — Z87448 Personal history of other diseases of urinary system: Secondary | ICD-10-CM | POA: Insufficient documentation

## 2015-09-25 DIAGNOSIS — Z79899 Other long term (current) drug therapy: Secondary | ICD-10-CM | POA: Diagnosis not present

## 2015-09-25 DIAGNOSIS — Z862 Personal history of diseases of the blood and blood-forming organs and certain disorders involving the immune mechanism: Secondary | ICD-10-CM | POA: Insufficient documentation

## 2015-09-25 DIAGNOSIS — Z8701 Personal history of pneumonia (recurrent): Secondary | ICD-10-CM | POA: Insufficient documentation

## 2015-09-25 LAB — RAPID STREP SCREEN (MED CTR MEBANE ONLY): Streptococcus, Group A Screen (Direct): NEGATIVE

## 2015-09-25 MED ORDER — KETOROLAC TROMETHAMINE 60 MG/2ML IM SOLN
60.0000 mg | Freq: Once | INTRAMUSCULAR | Status: AC
  Administered 2015-09-25: 60 mg via INTRAMUSCULAR
  Filled 2015-09-25: qty 2

## 2015-09-25 MED ORDER — AMOXICILLIN 500 MG PO CAPS
500.0000 mg | ORAL_CAPSULE | Freq: Once | ORAL | Status: AC
  Administered 2015-09-25: 500 mg via ORAL
  Filled 2015-09-25: qty 1

## 2015-09-25 MED ORDER — DEXAMETHASONE SODIUM PHOSPHATE 10 MG/ML IJ SOLN
10.0000 mg | Freq: Once | INTRAMUSCULAR | Status: AC
  Administered 2015-09-25: 10 mg via INTRAMUSCULAR
  Filled 2015-09-25: qty 1

## 2015-09-25 MED ORDER — AMOXICILLIN 500 MG PO CAPS: 500.0000 mg | ORAL_CAPSULE | Freq: Two times a day (BID) | ORAL | Status: AC

## 2015-09-25 NOTE — ED Provider Notes (Signed)
CSN: [id number]     Arrival date & time 09/25/15  1305 History  By signing my name below, I, Sonum Patel, attest that this documentation has been prepared under the direction and in the presence of Hawkins Seaman PA-C. Electronically Signed: Sonum Patel, Scribe. 09/25/2015. 1:51 PM.    Chief Complaint  Patient presents with  . Sore Throat    The history is provided by the patient. No language interpreter was used.    HPI Comments: [name] [name] is a 50 y.o. male who presents to the Emergency Department complaining of persistent, unchanged sore throat for the past ~3 weeks. Patient was seen on 09/01/15 for URI symptoms and states his other symptoms have resolved except the sore throat. He states he is able to eat and drink fluids well. He denies rhinorrhea, fever, chills, nausea, vomiting, abdominal pain, diarrhea.   Past Medical History  Diagnosis Date  . Substance abuse     Prior hx of cocaine (quit 5 years ago)/EtOH (clean for 14 months) as of 10/2011  . Atrial flutter (HCC)   . Diastolic CHF, chronic (HCC)   . Community acquired bacterial pneumonia   . SOB (shortness of breath)   . N&V (nausea and vomiting)   . ARF (acute renal failure) (HCC)   . Leukocytosis    History reviewed. No pertinent past surgical history. Family History  Problem Relation Age of Onset  . Heart disease Neg Hx    Social History  Substance Use Topics  . Smoking status: Former Games developer  . Smokeless tobacco: None     Comment: Quit April 2013  . Alcohol Use: No     Comment: Previously drank heavily, has been sober for 14 months    Review of Systems  Constitutional: Negative for fever and chills.  HENT: Positive for sore throat. Negative for rhinorrhea and trouble swallowing.   Gastrointestinal: Negative for nausea, vomiting, abdominal pain and diarrhea.      Allergies  Review of patient's allergies indicates no known allergies.  Home Medications   Prior to Admission medications   Medication Sig  Start Date End Date Taking? Authorizing Provider  diltiazem (CARDIZEM) 90 MG tablet Take 1 tablet (90 mg total) by mouth every 12 (twelve) hours. 03/24/13 08/23/14  Lars Masson, MD  esomeprazole (NEXIUM) 40 MG capsule Take 1 capsule (40 mg total) by mouth daily. 09/01/15   Chase Picket Ward, PA-C  ibuprofen (ADVIL,MOTRIN) 600 MG tablet Take 1 tablet (600 mg total) by mouth every 6 (six) hours as needed. 01/15/15   Loren Racer, MD  methocarbamol (ROBAXIN) 500 MG tablet Take 2 tablets (1,000 mg total) by mouth every 8 (eight) hours as needed for muscle spasms. 01/15/15   Loren Racer, MD  predniSONE (DELTASONE) 20 MG tablet Take 2 tablets (40 mg total) by mouth daily. 02/08/15   Roxy Horseman, PA-C   BP 113/79 mmHg  Pulse 78  Temp(Src) 98.2 F (36.8 C) (Oral)  Resp 16  SpO2 95% Physical Exam  Constitutional: He is oriented to person, place, and time. He appears well-developed and well-nourished. No distress.  HENT:  Head: Normocephalic and atraumatic.  Right Ear: External ear normal.  Left Ear: External ear normal.  Nose: Nose normal.  Mouth/Throat: Uvula is midline and mucous membranes are normal. Mucous membranes are not pale, not dry and not cyanotic. No trismus in the jaw. No uvula swelling. Posterior oropharyngeal erythema present. No oropharyngeal exudate or tonsillar abscesses.  Eyes: Conjunctivae and EOM are normal. Pupils are equal, round,  and reactive to light. Right eye exhibits no discharge. Left eye exhibits no discharge. No scleral icterus.  Neck: Normal range of motion. No JVD present. No tracheal deviation present. No thyromegaly present.  Cardiovascular: Normal rate, regular rhythm, normal heart sounds and intact distal pulses.  Exam reveals no gallop and no friction rub.   No murmur heard. Pulmonary/Chest: Effort normal and breath sounds normal. No respiratory distress. He has no wheezes. He has no rales. He exhibits no tenderness.  Abdominal: Soft. Bowel sounds are  normal. He exhibits no distension and no mass. There is no tenderness. There is no rebound and no guarding.  Musculoskeletal: Normal range of motion. He exhibits no edema or tenderness.  Lymphadenopathy:    He has no cervical adenopathy.  Neurological: He is alert and oriented to person, place, and time. He has normal reflexes. No cranial nerve deficit. He exhibits normal muscle tone. Coordination normal.  Skin: Skin is warm and dry. No rash noted. He is not diaphoretic. No erythema. No pallor.  Psychiatric: He has a normal mood and affect. His behavior is normal. Judgment and thought content normal.  Nursing note and vitals reviewed.   ED Course  Procedures (including critical care time)  DIAGNOSTIC STUDIES: Oxygen Saturation is 95% on RA, adequate by my interpretation.    COORDINATION OF CARE: 1:58 PM Will treat for strep throat. Discussed treatment plan with pt at bedside and pt agreed to plan. Return precautions discussed.    Labs Review Labs Reviewed  [facility]APID [facility]RE[facility]NOT [facility])  CULTURE, GROUP A STREP [facility])    Imaging Review No results found. I have personally reviewed and evaluated these lab results as part of my medical decision-making.   EKG Interpretation None      MDM   Pt with 3 weeks of sore throat, non-toxic appearing, afebrile.  Rapid strep obtained, negative.   Feel pt may benefit from Abx tx given prolonged illness and no improvement with supportive tx.  Given Decadron and Toradol injection in the ER. He was able to swallow amoxicillin antibiotic without difficulty.  No concern for peritonsillar abscess. Return precautions given. Patient was discharged in good condition with a course of amoxicillin.    Final diagnoses:  Pharyngitis   I personally performed the services described in this documentation, which was scribed in my presence. The recorded information has been reviewed and is accurate.      Danelle BerryLeisa Shirel Mallis, PA-C 10/04/15 0121  Tomasita CrumbleAdeleke Oni,  MD 10/04/15 [phone]

## 2015-09-25 NOTE — Discharge Instructions (Signed)
Pharyngitis °Pharyngitis is redness, pain, and swelling (inflammation) of your pharynx.  °CAUSES  °Pharyngitis is usually caused by infection. Most of the time, these infections are from viruses (viral) and are part of a cold. However, sometimes pharyngitis is caused by bacteria (bacterial). Pharyngitis can also be caused by allergies. Viral pharyngitis may be spread from person to person by coughing, sneezing, and personal items or utensils (cups, forks, spoons, toothbrushes). Bacterial pharyngitis may be spread from person to person by more intimate contact, such as kissing.  °SIGNS AND SYMPTOMS  °Symptoms of pharyngitis include:   °· Sore throat.   °· Tiredness (fatigue).   °· Low-grade fever.   °· Headache. °· Joint pain and muscle aches. °· Skin rashes. °· Swollen lymph nodes. °· Plaque-like film on throat or tonsils (often seen with bacterial pharyngitis). °DIAGNOSIS  °Your health care provider will ask you questions about your illness and your symptoms. Your medical history, along with a physical exam, is often all that is needed to diagnose pharyngitis. Sometimes, a rapid strep test is done. Other lab tests may also be done, depending on the suspected cause.  °TREATMENT  °Viral pharyngitis will usually get better in 3-4 days without the use of medicine. Bacterial pharyngitis is treated with medicines that kill germs (antibiotics).  °HOME CARE INSTRUCTIONS  °· Drink enough water and fluids to keep your urine clear or pale yellow.   °· Only take over-the-counter or prescription medicines as directed by your health care provider:   °· If you are prescribed antibiotics, make sure you finish them even if you start to feel better.   °· Do not take aspirin.   °· Get lots of rest.   °· Gargle with 8 oz of salt water (½ tsp of salt per 1 qt of water) as often as every 1-2 hours to soothe your throat.   °· Throat lozenges (if you are not at risk for choking) or sprays may be used to soothe your throat. °SEEK MEDICAL  CARE IF:  °· You have large, tender lumps in your neck. °· You have a rash. °· You cough up green, yellow-brown, or bloody spit. °SEEK IMMEDIATE MEDICAL CARE IF:  °· Your neck becomes stiff. °· You drool or are unable to swallow liquids. °· You vomit or are unable to keep medicines or liquids down. °· You have severe pain that does not go away with the use of recommended medicines. °· You have trouble breathing (not caused by a stuffy nose). °MAKE SURE YOU:  °· Understand these instructions. °· Will watch your condition. °· Will get help right away if you are not doing well or get worse. °  °This information is not intended to replace advice given to you by your health care provider. Make sure you discuss any questions you have with your health care provider. °  °Document Released: 06/26/2005 Document Revised: 04/16/2013 Document Reviewed: 03/03/2013 °Elsevier Interactive Patient Education ©2016 Elsevier Inc. °Upper Respiratory Infection, Adult °Most upper respiratory infections (URIs) are a viral infection of the air passages leading to the lungs. A URI affects the nose, throat, and upper air passages. The most common type of URI is nasopharyngitis and is typically referred to as "the common cold." °URIs run their course and usually go away on their own. Most of the time, a URI does not require medical attention, but sometimes a bacterial infection in the upper airways can follow a viral infection. This is called a secondary infection. Sinus and middle ear infections are common types of secondary upper respiratory infections. °Bacterial pneumonia   can also complicate a URI. A URI can worsen asthma and chronic obstructive pulmonary disease (COPD). Sometimes, these complications can require emergency medical care and may be life threatening.  CAUSES Almost all URIs are caused by viruses. A virus is a type of germ and can spread from one person to another.  RISKS FACTORS You may be at risk for a URI if:   You  smoke.   You have chronic heart or lung disease.  You have a weakened defense (immune) system.   You are very young or very old.   You have nasal allergies or asthma.  You work in crowded or poorly ventilated areas.  You work in health care facilities or schools. SIGNS AND SYMPTOMS  Symptoms typically develop 2-3 days after you come in contact with a cold virus. Most viral URIs last 7-10 days. However, viral URIs from the influenza virus (flu virus) can last 14-18 days and are typically more severe. Symptoms may include:   Runny or stuffy (congested) nose.   Sneezing.   Cough.   Sore throat.   Headache.   Fatigue.   Fever.   Loss of appetite.   Pain in your forehead, behind your eyes, and over your cheekbones (sinus pain).  Muscle aches.  DIAGNOSIS  Your health care provider may diagnose a URI by:  Physical exam.  Tests to check that your symptoms are not due to another condition such as:  Strep throat.  Sinusitis.  Pneumonia.  Asthma. TREATMENT  A URI goes away on its own with time. It cannot be cured with medicines, but medicines may be prescribed or recommended to relieve symptoms. Medicines may help:  Reduce your fever.  Reduce your cough.  Relieve nasal congestion. HOME CARE INSTRUCTIONS   Take medicines only as directed by your health care provider.   Gargle warm saltwater or take cough drops to comfort your throat as directed by your health care provider.  Use a warm mist humidifier or inhale steam from a shower to increase air moisture. This may make it easier to breathe.  Drink enough fluid to keep your urine clear or pale yellow.   Eat soups and other clear broths and maintain good nutrition.   Rest as needed.   Return to work when your temperature has returned to normal or as your health care provider advises. You may need to stay home longer to avoid infecting others. You can also use a face mask and careful hand  washing to prevent spread of the virus.  Increase the usage of your inhaler if you have asthma.   Do not use any tobacco products, including cigarettes, chewing tobacco, or electronic cigarettes. If you need help quitting, ask your health care provider. PREVENTION  The best way to protect yourself from getting a cold is to practice good hygiene.   Avoid oral or hand contact with people with cold symptoms.   Wash your hands often if contact occurs.  There is no clear evidence that vitamin C, vitamin E, echinacea, or exercise reduces the chance of developing a cold. However, it is always recommended to get plenty of rest, exercise, and practice good nutrition.  SEEK MEDICAL CARE IF:   You are getting worse rather than better.   Your symptoms are not controlled by medicine.   You have chills.  You have worsening shortness of breath.  You have brown or red mucus.  You have yellow or brown nasal discharge.  You have pain in your face, especially  when you bend forward.  You have a fever.  You have swollen neck glands.  You have pain while swallowing.  You have white areas in the back of your throat. SEEK IMMEDIATE MEDICAL CARE IF:   You have severe or persistent:  Headache.  Ear pain.  Sinus pain.  Chest pain.  You have chronic lung disease and any of the following:  Wheezing.  Prolonged cough.  Coughing up blood.  A change in your usual mucus.  You have a stiff neck.  You have changes in your:  Vision.  Hearing.  Thinking.  Mood. MAKE SURE YOU:   Understand these instructions.  Will watch your condition.  Will get help right away if you are not doing well or get worse.   This information is not intended to replace advice given to you by your health care provider. Make sure you discuss any questions you have with your health care provider.   Document Released: 12/20/2000 Document Revised: 11/10/2014 Document Reviewed: 10/01/2013 Elsevier  Interactive Patient Education 2016 Elsevier Inc.  Acute Bronchitis Bronchitis is inflammation of the airways that extend from the windpipe into the lungs (bronchi). The inflammation often causes mucus to develop. This leads to a cough, which is the most common symptom of bronchitis.  In acute bronchitis, the condition usually develops suddenly and goes away over time, usually in a couple weeks. Smoking, allergies, and asthma can make bronchitis worse. Repeated episodes of bronchitis may cause further lung problems.  CAUSES Acute bronchitis is most often caused by the same virus that causes a cold. The virus can spread from person to person (contagious) through coughing, sneezing, and touching contaminated objects. SIGNS AND SYMPTOMS   Cough.   Fever.   Coughing up mucus.   Body aches.   Chest congestion.   Chills.   Shortness of breath.   Sore throat.  DIAGNOSIS  Acute bronchitis is usually diagnosed through a physical exam. Your health care provider will also ask you questions about your medical history. Tests, such as chest X-rays, are sometimes done to rule out other conditions.  TREATMENT  Acute bronchitis usually goes away in a couple weeks. Oftentimes, no medical treatment is necessary. Medicines are sometimes given for relief of fever or cough. Antibiotic medicines are usually not needed but may be prescribed in certain situations. In some cases, an inhaler may be recommended to help reduce shortness of breath and control the cough. A cool mist vaporizer may also be used to help thin bronchial secretions and make it easier to clear the chest.  HOME CARE INSTRUCTIONS  Get plenty of rest.   Drink enough fluids to keep your urine clear or pale yellow (unless you have a medical condition that requires fluid restriction). Increasing fluids may help thin your respiratory secretions (sputum) and reduce chest congestion, and it will prevent dehydration.   Take medicines  only as directed by your health care provider.  If you were prescribed an antibiotic medicine, finish it all even if you start to feel better.  Avoid smoking and secondhand smoke. Exposure to cigarette smoke or irritating chemicals will make bronchitis worse. If you are a smoker, consider using nicotine gum or skin patches to help control withdrawal symptoms. Quitting smoking will help your lungs heal faster.   Reduce the chances of another bout of acute bronchitis by washing your hands frequently, avoiding people with cold symptoms, and trying not to touch your hands to your mouth, nose, or eyes.   Keep all follow-up  visits as directed by your health care provider.  SEEK MEDICAL CARE IF: Your symptoms do not improve after 1 week of treatment.  SEEK IMMEDIATE MEDICAL CARE IF:  You develop an increased fever or chills.   You have chest pain.   You have severe shortness of breath.  You have bloody sputum.   You develop dehydration.  You faint or repeatedly feel like you are going to pass out.  You develop repeated vomiting.  You develop a severe headache. MAKE SURE YOU:   Understand these instructions.  Will watch your condition.  Will get help right away if you are not doing well or get worse.   This information is not intended to replace advice given to you by your health care provider. Make sure you discuss any questions you have with your health care provider.   Document Released: 08/03/2004 Document Revised: 07/17/2014 Document Reviewed: 12/17/2012 Elsevier Interactive Patient Education [employer]

## 2015-09-25 NOTE — ED Notes (Signed)
Patient here from home with complaints of sore throat. Mucinex and cough drops with no relief.

## 2015-09-28 LAB — CULTURE, GROUP A STREP (THRC)

## 2015-12-18 ENCOUNTER — Encounter (HOSPITAL_COMMUNITY): Payer: Self-pay | Admitting: Emergency Medicine

## 2015-12-18 ENCOUNTER — Emergency Department (HOSPITAL_COMMUNITY)
Admission: EM | Admit: 2015-12-18 | Discharge: 2015-12-18 | Disposition: A | Discharge status: Home / Self Care | Payer: Managed Care, Other (non HMO) | Attending: Emergency Medicine | Admitting: Emergency Medicine

## 2015-12-18 DIAGNOSIS — Z87891 Personal history of nicotine dependence: Secondary | ICD-10-CM | POA: Insufficient documentation

## 2015-12-18 DIAGNOSIS — Z79899 Other long term (current) drug therapy: Secondary | ICD-10-CM | POA: Insufficient documentation

## 2015-12-18 DIAGNOSIS — J029 Acute pharyngitis, unspecified: Secondary | ICD-10-CM | POA: Insufficient documentation

## 2015-12-18 LAB — RAPID STREP SCREEN (MED CTR MEBANE ONLY): STREPTOCOCCUS, GROUP A SCREEN (DIRECT): NEGATIVE

## 2015-12-18 MED ORDER — PREDNISONE 50 MG PO TABS: ORAL_TABLET | ORAL | Status: DC

## 2015-12-18 MED ORDER — PREDNISONE 20 MG PO TABS
60.0000 mg | ORAL_TABLET | Freq: Once | ORAL | Status: AC
  Administered 2015-12-18: 60 mg via ORAL
  Filled 2015-12-18: qty 3

## 2015-12-18 NOTE — ED Notes (Signed)
Pt states he's been seen for a sore throat multiple times without improvement. States he's declined antibiotics in the past and believes he needs one now. States all of his other URI symptoms have cleared

## 2015-12-18 NOTE — Discharge Instructions (Signed)
Please follow with your primary care doctor in the next 2 days for a check-up. They must obtain records for further management.   Do not hesitate to return to the Emergency Department for any new, worsening or concerning symptoms.   For pain control please take Ibuprofen (also known as Motrin or Advil) 400mg  (this is normally 2 over the counter pills) every 6 hours. Take with food to minimize stomach irritation.  Pharyngitis Pharyngitis is redness, pain, and swelling (inflammation) of your pharynx.  CAUSES  Pharyngitis is usually caused by infection. Most of the time, these infections are from viruses (viral) and are part of a cold. However, sometimes pharyngitis is caused by bacteria (bacterial). Pharyngitis can also be caused by allergies. Viral pharyngitis may be spread from person to person by coughing, sneezing, and personal items or utensils (cups, forks, spoons, toothbrushes). Bacterial pharyngitis may be spread from person to person by more intimate contact, such as kissing.  SIGNS AND SYMPTOMS  Symptoms of pharyngitis include:   Sore throat.   Tiredness (fatigue).   Low-grade fever.   Headache.  Joint pain and muscle aches.  Skin rashes.  Swollen lymph nodes.  Plaque-like film on throat or tonsils (often seen with bacterial pharyngitis). DIAGNOSIS  Your health care provider will ask you questions about your illness and your symptoms. Your medical history, along with a physical exam, is often all that is needed to diagnose pharyngitis. Sometimes, a rapid strep test is done. Other lab tests may also be done, depending on the suspected cause.  TREATMENT  Viral pharyngitis will usually get better in 3-4 days without the use of medicine. Bacterial pharyngitis is treated with medicines that kill germs (antibiotics).  HOME CARE INSTRUCTIONS   Drink enough water and fluids to keep your urine clear or pale yellow.   Only take over-the-counter or prescription medicines as  directed by your health care provider:   If you are prescribed antibiotics, make sure you finish them even if you start to feel better.   Do not take aspirin.   Get lots of rest.   Gargle with 8 oz of salt water ( tsp of salt per 1 qt of water) as often as every 1-2 hours to soothe your throat.   Throat lozenges (if you are not at risk for choking) or sprays may be used to soothe your throat. SEEK MEDICAL CARE IF:   You have large, tender lumps in your neck.  You have a rash.  You cough up green, yellow-brown, or bloody spit. SEEK IMMEDIATE MEDICAL CARE IF:   Your neck becomes stiff.  You drool or are unable to swallow liquids.  You vomit or are unable to keep medicines or liquids down.  You have severe pain that does not go away with the use of recommended medicines.  You have trouble breathing (not caused by a stuffy nose). MAKE SURE YOU:   Understand these instructions.  Will watch your condition.  Will get help right away if you are not doing well or get worse.   This information is not intended to replace advice given to you by your health care provider. Make sure you discuss any questions you have with your health care provider.   Document Released: 06/26/2005 Document Revised: 04/16/2013 Document Reviewed: 03/03/2013 Elsevier Interactive Patient Education [employer]2016 [employer] Inc[employer]

## 2015-12-18 NOTE — ED Provider Notes (Signed)
CSN: [id number]     Arrival date & time 12/18/15  1416 History  By signing my name below, I, Placido Sou, attest that this documentation has been prepared under the direction and in the presence of [employer], PA-C. Electronically Signed: Placido Sou, ED Scribe. 12/18/2015. 2:59 PM.   Chief Complaint  Patient presents with  . Sore Throat   The history is provided by the patient. No language interpreter was used.    HPI Comments: [name] [name] is a 50 y.o. male who presents to the Emergency Department complaining of constant, moderate, sore throat x 1 week. Pt states he was evaluated a few months ago for similar symptoms further noting he received an injection and was d/c with abx which provided relief. He reports associated subjective fevers (98.1 F in triage), fatigue, decreased appetite, 1x vomiting yesterday and cough. He has taken OTC cold medications without relief. He denies a PMHx of DM. Pt denies any known drug allergies. Pt denies ear pain, CP, SOB, abd pain and rhinorrhea.   Past Medical History  Diagnosis Date  . Substance abuse     Prior hx of cocaine (quit 5 years ago)/EtOH (clean for 14 months) as of 10/2011  . Atrial flutter (HCC)   . Diastolic CHF, chronic (HCC)   . Community acquired bacterial pneumonia   . SOB (shortness of breath)   . N&V (nausea and vomiting)   . ARF (acute renal failure) (HCC)   . Leukocytosis    History reviewed. No pertinent past surgical history. Family History  Problem Relation Age of Onset  . Heart disease Neg Hx    Social History  Substance Use Topics  . Smoking status: Former Games developer  . Smokeless tobacco: None     Comment: Quit April 2013  . Alcohol Use: No     Comment: Previously drank heavily, has been sober for 14 months    Review of Systems A complete 10 system review of systems was obtained and all systems are negative except as noted in the HPI and PMH.   Allergies  Review of patient's allergies indicates no  known allergies.  Home Medications   Prior to Admission medications   Medication Sig Start Date End Date Taking? Authorizing Provider  diltiazem (CARDIZEM) 90 MG tablet Take 1 tablet (90 mg total) by mouth every 12 (twelve) hours. 03/24/13 08/23/14  Lars Masson, MD  esomeprazole (NEXIUM) 40 MG capsule Take 1 capsule (40 mg total) by mouth daily. 09/01/15   Chase Picket Ward, PA-C  ibuprofen (ADVIL,MOTRIN) 600 MG tablet Take 1 tablet (600 mg total) by mouth every 6 (six) hours as needed. 01/15/15   Loren Racer, MD  methocarbamol (ROBAXIN) 500 MG tablet Take 2 tablets (1,000 mg total) by mouth every 8 (eight) hours as needed for muscle spasms. 01/15/15   Loren Racer, MD  predniSONE (DELTASONE) 20 MG tablet Take 2 tablets (40 mg total) by mouth daily. 02/08/15   Roxy Horseman, PA-C   BP 127/72 mmHg  Pulse 88  Temp(Src) 98.1 F (36.7 C) (Oral)  Resp 20  SpO2 97%    Physical Exam  Constitutional: He is oriented to person, place, and time. He appears well-developed and well-nourished.  HENT:  Head: Normocephalic and atraumatic.  Right Ear: External ear normal.  Left Ear: External ear normal.  Nose: Nose normal.  Mouth/Throat: Oropharynx is clear and moist. No oropharyngeal exudate.  Posterior pharynx with mild injection, no exudate, mild tonsillar hypertrophy 2+ bilaterally  Eyes: Conjunctivae and EOM are  normal. Pupils are equal, round, and reactive to light.  Neck: Normal range of motion. Neck supple.  Shotty nontender anterior cervical lymphadenopathy bilaterally  Cardiovascular: Normal rate.   Pulmonary/Chest: Effort normal and breath sounds normal. No respiratory distress. He has no wheezes. He has no rales. He exhibits no tenderness.  Abdominal: Soft.  Musculoskeletal: Normal range of motion.  Lymphadenopathy:    He has cervical adenopathy.  Neurological: He is alert and oriented to person, place, and time.  Skin: Skin is warm and dry.  Psychiatric: He has a normal  mood and affect.  Nursing note and vitals reviewed.   ED Course  Procedures  DIAGNOSTIC STUDIES: Oxygen Saturation is 97% on RA, normal by my interpretation.    COORDINATION OF CARE: 2:58 PM Discussed next steps with pt including a rapid strep screening and reevaluation based on the results. Pt verbalized understanding and is agreeable with the plan.   Labs Review Labs Reviewed - No data to display  Imaging Review No results found. I have personally reviewed and evaluated these lab results as part of my medical decision-making.   EKG Interpretation None      MDM   Final diagnoses:  Pharyngitis    Filed Vitals:   12/18/15 1423 12/18/15 1615  BP: 127/72 116/67  Pulse: 88 69  Temp: 98.1 F (36.7 C) 97.8 F (36.6 C)  TempSrc: Oral Oral  Resp: 20 18  SpO2: 97% 98%    Medications  predniSONE (DELTASONE) tablet 60 mg (60 mg Oral Given 12/18/15 1516)    [name] [name] is 50 y.o. male presenting with Sore throat onset approximately 1 week ago. Patient afebrile, overall well appearing, rapid strep negative, patient given prednisone burst for comfort. Strep culture pending.  Evaluation does not show pathology that would require ongoing emergent intervention or inpatient treatment. Pt is hemodynamically stable and mentating appropriately. Discussed findings and plan with patient/guardian, who agrees with care plan. All questions answered. Return precautions discussed and outpatient follow up given.   I personally performed the services described in this documentation, which was scribed in my presence. The recorded information has been reviewed and is accurate.      Wynetta Emeryicole Dariela Stoker, PA-C 12/18/15 1704  Bethann BerkshireJoseph Zammit, MD 12/18/15 2149

## 2015-12-21 LAB — CULTURE, GROUP A STREP (THRC)

## 2016-07-28 ENCOUNTER — Emergency Department (HOSPITAL_COMMUNITY): Payer: Managed Care, Other (non HMO)

## 2016-07-28 ENCOUNTER — Encounter (HOSPITAL_COMMUNITY): Payer: Self-pay

## 2016-07-28 ENCOUNTER — Emergency Department (HOSPITAL_COMMUNITY)
Admission: EM | Admit: 2016-07-28 | Discharge: 2016-07-28 | Disposition: A | Discharge status: Home / Self Care | Payer: Managed Care, Other (non HMO) | Attending: Emergency Medicine | Admitting: Emergency Medicine

## 2016-07-28 DIAGNOSIS — Y999 Unspecified external cause status: Secondary | ICD-10-CM | POA: Diagnosis not present

## 2016-07-28 DIAGNOSIS — Y939 Activity, unspecified: Secondary | ICD-10-CM | POA: Insufficient documentation

## 2016-07-28 DIAGNOSIS — Z79899 Other long term (current) drug therapy: Secondary | ICD-10-CM | POA: Diagnosis not present

## 2016-07-28 DIAGNOSIS — Z87891 Personal history of nicotine dependence: Secondary | ICD-10-CM | POA: Insufficient documentation

## 2016-07-28 DIAGNOSIS — R51 Headache: Secondary | ICD-10-CM | POA: Diagnosis not present

## 2016-07-28 DIAGNOSIS — I5032 Chronic diastolic (congestive) heart failure: Secondary | ICD-10-CM | POA: Diagnosis not present

## 2016-07-28 DIAGNOSIS — M79601 Pain in right arm: Secondary | ICD-10-CM | POA: Diagnosis present

## 2016-07-28 DIAGNOSIS — Y92410 Unspecified street and highway as the place of occurrence of the external cause: Secondary | ICD-10-CM | POA: Insufficient documentation

## 2016-07-28 DIAGNOSIS — M7918 Myalgia, other site: Secondary | ICD-10-CM

## 2016-07-28 MED ORDER — METHOCARBAMOL 500 MG PO TABS: ORAL_TABLET | ORAL | 0 refills | Status: DC

## 2016-07-28 MED ORDER — IBUPROFEN 200 MG PO TABS
400.0000 mg | ORAL_TABLET | Freq: Once | ORAL | Status: AC
  Administered 2016-07-28: 400 mg via ORAL
  Filled 2016-07-28: qty 2
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
## 2016-07-28 NOTE — Discharge Instructions (Signed)

## 2017-01-23 ENCOUNTER — Encounter (HOSPITAL_COMMUNITY): Payer: Self-pay | Admitting: Emergency Medicine

## 2017-01-23 ENCOUNTER — Emergency Department (HOSPITAL_COMMUNITY)
Admission: EM | Admit: 2017-01-23 | Discharge: 2017-01-23 | Disposition: A | Discharge status: Home / Self Care | Payer: No Typology Code available for payment source | Attending: Emergency Medicine | Admitting: Emergency Medicine

## 2017-01-23 DIAGNOSIS — Z79899 Other long term (current) drug therapy: Secondary | ICD-10-CM | POA: Insufficient documentation

## 2017-01-23 DIAGNOSIS — Z87891 Personal history of nicotine dependence: Secondary | ICD-10-CM | POA: Insufficient documentation

## 2017-01-23 DIAGNOSIS — I509 Heart failure, unspecified: Secondary | ICD-10-CM | POA: Insufficient documentation

## 2017-01-23 DIAGNOSIS — H109 Unspecified conjunctivitis: Secondary | ICD-10-CM

## 2017-01-23 DIAGNOSIS — I4892 Unspecified atrial flutter: Secondary | ICD-10-CM | POA: Insufficient documentation

## 2017-01-23 DIAGNOSIS — N179 Acute kidney failure, unspecified: Secondary | ICD-10-CM | POA: Insufficient documentation

## 2017-01-23 DIAGNOSIS — H10021 Other mucopurulent conjunctivitis, right eye: Secondary | ICD-10-CM | POA: Insufficient documentation

## 2017-01-23 MED ORDER — FLUORESCEIN SODIUM 0.6 MG OP STRP
1.0000 | ORAL_STRIP | Freq: Once | OPHTHALMIC | Status: AC
  Administered 2017-01-23: 1 via OPHTHALMIC

## 2017-01-23 MED ORDER — TOBRAMYCIN 0.3 % OP SOLN: 2.0000 [drp] | OPHTHALMIC | 0 refills | Status: DC

## 2017-01-23 MED ORDER — ESOMEPRAZOLE MAGNESIUM 40 MG PO CPDR: 40.0000 mg | DELAYED_RELEASE_CAPSULE | Freq: Every day | ORAL | 0 refills | Status: DC

## 2017-01-23 NOTE — Discharge Instructions (Signed)
Return if any problems.  Cool compresses 20 minutes 4 times a day

## 2017-01-23 NOTE — ED Provider Notes (Signed)
[facility]-EMERGENCY DEPT Provider Note   CSN: [id number] Arrival date & time: 01/23/17  1636     History   Chief Complaint Chief Complaint  Patient presents with  . Eye Pain  . Medication Refill    HPI [name] [name] is a 51 y.o. male.  The history is provided by the patient. No language interpreter was used.  Eye Pain  This is a new problem. The current episode started yesterday. The problem occurs constantly. The problem has been gradually worsening. Pertinent negatives include no shortness of breath. Nothing aggravates the symptoms. Nothing relieves the symptoms. He has tried nothing for the symptoms.  Medication Refill   Pt complains of redness to his left eye since yesterday.  Pt reports drainage. No known injury  Past Medical History:  Diagnosis Date  . ARF (acute renal failure) (HCC)   . Atrial flutter (HCC)   . Community acquired bacterial pneumonia   . Diastolic CHF, chronic (HCC)   . Leukocytosis   . N&V (nausea and vomiting)   . SOB (shortness of breath)   . Substance abuse    Prior hx of cocaine (quit 5 years ago)/EtOH (clean for 14 months) as of 10/2011    Patient Active Problem List   Diagnosis Date Noted  . Substance abuse   . Diastolic CHF, chronic   . Diastolic CHF, chronic (HCC) 10/24/2011  . Atrial flutter (HCC) 10/22/2011  . Community acquired bacterial pneumonia 10/21/2011  . SOB (shortness of breath) 10/21/2011  . N&V (nausea and vomiting) 10/21/2011  . ARF (acute renal failure) (HCC) 10/21/2011  . Tobacco abuse 10/21/2011  . Leukocytosis 10/21/2011  . Fever 10/21/2011    History reviewed. No pertinent surgical history.     Home Medications    Prior to Admission medications   Medication Sig Start Date End Date Taking? Authorizing Provider  diltiazem (CARDIZEM) 90 MG tablet Take 1 tablet (90 mg total) by mouth every 12 (twelve) hours. 03/24/13 08/23/14  Lars MassonNelson, Katarina H, MD  esomeprazole (NEXIUM) 40 MG capsule Take 1 capsule (40 mg total)  by mouth daily. 09/01/15   Ward, Chase PicketJaime Pilcher, PA-C  ibuprofen (ADVIL,MOTRIN) 600 MG tablet Take 1 tablet (600 mg total) by mouth every 6 (six) hours as needed. 01/15/15   Loren RacerYelverton, David, MD  methocarbamol (ROBAXIN) 500 MG tablet Can take up to 1-2 tabs every 6 hours PRN PAIN 07/28/16   Pisciotta, Joni ReiningNicole, PA-C  predniSONE (DELTASONE) 50 MG tablet Take 1 tablet daily with breakfast 12/18/15   Pisciotta, Joni ReiningNicole, PA-C  tobramycin (TOBREX) 0.3 % ophthalmic solution Place 2 drops into the right eye every 4 (four) hours. 01/23/17   Elson AreasSofia, Vahe Pienta K, PA-C    Family History Family History  Problem Relation Age of Onset  . Heart disease Neg Hx     Social History Social History  Substance Use Topics  . Smoking status: Former Games developermoker  . Smokeless tobacco: Never Used     Comment: Quit April 2013  . Alcohol use No     Comment: Previously drank heavily, has been sober for 14 months     Allergies   Patient has no known allergies.   Review of Systems Review of Systems  Eyes: Positive for pain.  Respiratory: Negative for shortness of breath.   All other systems reviewed and are negative.    Physical Exam Updated Vital Signs BP 126/78 (BP Location: Right Arm)   Pulse 76   Temp 98.1 F (36.7 C) (Oral)   Resp 17   Ht  5\' 10"  (1.778 m)   Wt 101.6 kg (224 lb)   SpO2 95%   BMI 32.14 kg/m   Physical Exam  Constitutional: He appears well-developed and well-nourished.  HENT:  Head: Normocephalic.  Right Ear: External ear normal.  Eyes: Pupils are equal, round, and reactive to light.  Erythema right eye,  No foreign body, no fluro uptake   Musculoskeletal: Normal range of motion.  Neurological: He is alert.  Skin: Skin is warm.  Psychiatric: He has a normal mood and affect.  Nursing note and vitals reviewed.    ED Treatments / Results  Labs (all labs ordered are listed, but only abnormal results are displayed) Labs Reviewed - No data to display  EKG  EKG Interpretation None        Radiology No results found.  Procedures Procedures (including critical care time)  Medications Ordered in ED Medications  fluorescein ophthalmic strip 1 strip (1 strip Right Eye Given 01/23/17 1939)     Initial Impression / Assessment and Plan / ED Course  I have reviewed the triage vital signs and the nursing notes.  Pertinent labs & imaging results that were available during my care of the patient were reviewed by me and considered in my medical decision making (see chart for details).       Final Clinical Impressions(s) / ED Diagnoses   Final diagnoses:  Conjunctivitis of right eye, unspecified conjunctivitis type    New Prescriptions New Prescriptions   TOBRAMYCIN (TOBREX) 0.3 % OPHTHALMIC SOLUTION    Place 2 drops into the right eye every 4 (four) hours.     Elson Areas, [place] 01/23/17 1943    Nira Conn, MD 01/24/17 [phone]

## 2017-01-23 NOTE — ED Triage Notes (Signed)
Patient c/o right eye pain and watery since last night.  Patient states that when he closes eye it burns.

## 2017-01-23 NOTE — ED Notes (Signed)
Patient adds that he needs a prescription for Nexium because only medication that helps with he heart burn. If he gets a prescription then gets medications cheaper than OTC.

## 2017-04-11 ENCOUNTER — Emergency Department (HOSPITAL_COMMUNITY)
Admission: EM | Admit: 2017-04-11 | Discharge: 2017-04-12 | Disposition: A | Discharge status: Home / Self Care | Payer: No Typology Code available for payment source | Attending: Emergency Medicine | Admitting: Emergency Medicine

## 2017-04-11 ENCOUNTER — Encounter (HOSPITAL_COMMUNITY): Payer: Self-pay

## 2017-04-11 DIAGNOSIS — L0291 Cutaneous abscess, unspecified: Secondary | ICD-10-CM

## 2017-04-11 DIAGNOSIS — Z79899 Other long term (current) drug therapy: Secondary | ICD-10-CM | POA: Insufficient documentation

## 2017-04-11 DIAGNOSIS — I5032 Chronic diastolic (congestive) heart failure: Secondary | ICD-10-CM | POA: Insufficient documentation

## 2017-04-11 DIAGNOSIS — Z87891 Personal history of nicotine dependence: Secondary | ICD-10-CM | POA: Insufficient documentation

## 2017-04-11 DIAGNOSIS — L0211 Cutaneous abscess of neck: Secondary | ICD-10-CM | POA: Insufficient documentation

## 2017-04-11 MED ORDER — SULFAMETHOXAZOLE-TRIMETHOPRIM 800-160 MG PO TABS
1.0000 | ORAL_TABLET | Freq: Once | ORAL | Status: AC
  Administered 2017-04-11: 1 via ORAL
  Filled 2017-04-11: qty 1

## 2017-04-11 MED ORDER — SULFAMETHOXAZOLE-TRIMETHOPRIM 800-160 MG PO TABS: 1.0000 | ORAL_TABLET | Freq: Two times a day (BID) | ORAL | 0 refills | Status: AC

## 2017-04-11 NOTE — ED Triage Notes (Signed)
Pt has a boil on his throat for two days

## 2017-04-11 NOTE — ED Provider Notes (Signed)
[facility]-EMERGENCY DEPT Provider Note   CSN: [id number] Arrival date & time: 04/11/17  2119     History   Chief Complaint Chief Complaint  Patient presents with  . Abscess    HPI [name] [name] is a 51 y.o. male.  The history is provided by the patient and medical records.  Abscess     51 year old male with history of atrial flutter, CHF, polysubstance abuse, presenting to the ED for abscess of his neck. He first noticed this 2 days ago. States today area has become somewhat painful but has not necessarily increased in size. He denies any difficult swallowing. No fever or chills. Did not shave recently, but does report he gets a close shav hair cut every Thursday.  Past Medical History:  Diagnosis Date  . ARF (acute renal failure) (HCC)   . Atrial flutter (HCC)   . Community acquired bacterial pneumonia   . Diastolic CHF, chronic (HCC)   . Leukocytosis   . N&V (nausea and vomiting)   . SOB (shortness of breath)   . Substance abuse (HCC)    Prior hx of cocaine (quit 5 years ago)/EtOH (clean for 14 months) as of 10/2011    Patient Active Problem List   Diagnosis Date Noted  . Substance abuse (HCC)   . Diastolic CHF, chronic   . Diastolic CHF, chronic (HCC) 10/24/2011  . Atrial flutter (HCC) 10/22/2011  . Community acquired bacterial pneumonia 10/21/2011  . SOB (shortness of breath) 10/21/2011  . N&V (nausea and vomiting) 10/21/2011  . ARF (acute renal failure) (HCC) 10/21/2011  . Tobacco abuse 10/21/2011  . Leukocytosis 10/21/2011  . Fever 10/21/2011    History reviewed. No pertinent surgical history.     Home Medications    Prior to Admission medications   Medication Sig Start Date End Date Taking? Authorizing Provider  diltiazem (CARDIZEM) 90 MG tablet Take 1 tablet (90 mg total) by mouth every 12 (twelve) hours. 03/24/13 08/23/14  Lars Masson, MD  esomeprazole (NEXIUM) 40 MG capsule Take 1 capsule (40 mg total) by mouth daily. 01/23/17   Elson Areas,  PA-C  ibuprofen (ADVIL,MOTRIN) 600 MG tablet Take 1 tablet (600 mg total) by mouth every 6 (six) hours as needed. 01/15/15   Loren Racer, MD  methocarbamol (ROBAXIN) 500 MG tablet Can take up to 1-2 tabs every 6 hours PRN PAIN 07/28/16   Pisciotta, Joni Reining, PA-C  predniSONE (DELTASONE) 50 MG tablet Take 1 tablet daily with breakfast 12/18/15   Pisciotta, Joni Reining, PA-C  tobramycin (TOBREX) 0.3 % ophthalmic solution Place 2 drops into the right eye every 4 (four) hours. 01/23/17   Elson Areas, PA-C    Family History Family History  Problem Relation Age of Onset  . Heart disease Neg Hx     Social History Social History  Substance Use Topics  . Smoking status: Former Games developer  . Smokeless tobacco: Never Used     Comment: Quit April 2013  . Alcohol use No     Comment: Previously drank heavily, has been sober for 14 months     Allergies   Patient has no known allergies.   Review of Systems Review of Systems  Skin:       Abscess  All other systems reviewed and are negative.    Physical Exam Updated Vital Signs BP 111/80 (BP Location: Left Arm)   Pulse 82   Temp 98.4 F (36.9 C) (Oral)   Resp 18   Ht  (1.778 m)  Wt 89.7 kg (197 lb 11.2 oz)   SpO2 95%   BMI 28.37 kg/m   Physical Exam  Constitutional: He is oriented to person, place, and time. He appears well-developed and well-nourished.  HENT:  Head: Normocephalic and atraumatic.  Mouth/Throat: Oropharynx is clear and moist.  Eyes: Pupils are equal, round, and reactive to light. Conjunctivae and EOM are normal.  Neck: Normal range of motion.    Cardiovascular: Normal rate, regular rhythm and normal heart sounds.   Pulmonary/Chest: Effort normal and breath sounds normal. No respiratory distress. He has no wheezes.  Abdominal: Soft. Bowel sounds are normal. There is no tenderness. There is no rebound.  Musculoskeletal: Normal range of motion.  Neurological: He is alert and oriented to person, place, and  time.  Skin: Skin is warm and dry.  Psychiatric: He has a normal mood and affect.  Nursing note and vitals reviewed.    ED Treatments / Results  Labs (all labs ordered are listed, but only abnormal results are displayed) Labs Reviewed - No data to display  EKG  EKG Interpretation None       Radiology No results found.  Procedures Procedures (including critical care time)  Medications Ordered in ED Medications - No data to display   Initial Impression / Assessment and Plan / ED Course  I have reviewed the triage vital signs and the nursing notes.  Pertinent labs & imaging results that were available during my care of the patient were reviewed by me and considered in my medical decision making (see chart for details).  51 year old male here with what appears to be small boil of the lower anterior neck. There is no fluctuance or active drainage. No erythema or cellulitic changes. Given location and very small appearance, I&D deferred. We'll start antibiotics, encouraged warm compresses at home. Close follow-up with PCP if not improving.  Discussed plan with patient, he acknowledged understanding and agreed with plan of care.  Return precautions given for new or worsening symptoms.  Final Clinical Impressions(s) / ED Diagnoses   Final diagnoses:  Abscess    New Prescriptions Discharge Medication List as of 04/11/2017 11:53 PM    START taking these medications   Details  sulfamethoxazole-trimethoprim (BACTRIM DS,SEPTRA DS) 800-160 MG tablet Take 1 tablet by mouth 2 (two) times daily., Starting Wed 04/11/2017, Until Wed 04/18/2017, Print         Garlon Hatchet, PA-C 04/12/17 7829    Derwood Kaplan, MD 04/13/17 [phone]

## 2017-04-11 NOTE — Discharge Instructions (Signed)
Take the prescribed medication as directed.  Warm compresses to the area at least twice a day. Follow-up with your primary care doctor if any ongoing issues. Return to the ED for new or worsening symptoms.

## 2017-08-13 ENCOUNTER — Emergency Department (HOSPITAL_COMMUNITY)
Admission: EM | Admit: 2017-08-13 | Discharge: 2017-08-13 | Disposition: A | Discharge status: Home / Self Care | Payer: No Typology Code available for payment source | Attending: Emergency Medicine | Admitting: Emergency Medicine

## 2017-08-13 ENCOUNTER — Encounter (HOSPITAL_COMMUNITY): Payer: Self-pay | Admitting: Emergency Medicine

## 2017-08-13 ENCOUNTER — Emergency Department (HOSPITAL_COMMUNITY): Payer: No Typology Code available for payment source

## 2017-08-13 DIAGNOSIS — M545 Low back pain: Secondary | ICD-10-CM | POA: Insufficient documentation

## 2017-08-13 DIAGNOSIS — R05 Cough: Secondary | ICD-10-CM | POA: Insufficient documentation

## 2017-08-13 DIAGNOSIS — Z87891 Personal history of nicotine dependence: Secondary | ICD-10-CM | POA: Insufficient documentation

## 2017-08-13 DIAGNOSIS — I5031 Acute diastolic (congestive) heart failure: Secondary | ICD-10-CM | POA: Insufficient documentation

## 2017-08-13 DIAGNOSIS — Z79899 Other long term (current) drug therapy: Secondary | ICD-10-CM | POA: Insufficient documentation

## 2017-08-13 DIAGNOSIS — N189 Chronic kidney disease, unspecified: Secondary | ICD-10-CM | POA: Insufficient documentation

## 2017-08-13 DIAGNOSIS — I4891 Unspecified atrial fibrillation: Secondary | ICD-10-CM | POA: Insufficient documentation

## 2017-08-13 DIAGNOSIS — J0100 Acute maxillary sinusitis, unspecified: Secondary | ICD-10-CM | POA: Insufficient documentation

## 2017-08-13 MED ORDER — AMOXICILLIN 500 MG PO CAPS: 500.0000 mg | ORAL_CAPSULE | Freq: Three times a day (TID) | ORAL | 0 refills | Status: DC

## 2017-08-13 NOTE — Discharge Instructions (Signed)
It was our pleasure to provide your ER care today - we hope that you feel better.  Take antibiotic as prescribed.  Try claritind or zyrtecd as need for congestion.  Take acetaminophen and/or ibuprofen as need for pain.    Follow up with primary care doctor in 1 week if symptoms fail to improve/resolve.

## 2017-08-13 NOTE — ED Triage Notes (Signed)
Patient c/o sciatic nerve pain, cough and right side sinus pressure/pain since Saturday. Patient reports last time he felt like this is when he had PNA.

## 2017-08-13 NOTE — ED Provider Notes (Signed)
[facility]-EMERGENCY DEPT Provider Note   CSN: [id number] Arrival date & time: 08/13/17  0910     History   Chief Complaint Chief Complaint  Patient presents with  . Sciatica  . Cough  . Nasal Congestion    HPI [name] [name] is a 52 y.o. male.  Patient c/o sinus congestion, drainage and sinus pressure feeling in the past few days. Symptoms moderate. Persistent. Tried otc meds but not helping. Also w non prod cough. No sore throat. No trouble breathing or swallowing. No chest pain or sob. Also notes low back pain. States hx back pain in past. No radicular pain or leg pain. No numbness/weakness. Pain worse w bending and certain movements. Requests work note.    The history is provided by the patient.  Cough  Pertinent negatives include no chest pain, no sore throat, no shortness of breath and no eye redness.    Past Medical History:  Diagnosis Date  . ARF (acute renal failure) (HCC)   . Atrial flutter (HCC)   . Community acquired bacterial pneumonia   . Diastolic CHF, chronic (HCC)   . Leukocytosis   . N&V (nausea and vomiting)   . SOB (shortness of breath)   . Substance abuse (HCC)    Prior hx of cocaine (quit 5 years ago)/EtOH (clean for 14 months) as of 10/2011    Patient Active Problem List   Diagnosis Date Noted  . Substance abuse (HCC)   . Diastolic CHF, chronic   . Diastolic CHF, chronic (HCC) 10/24/2011  . Atrial flutter (HCC) 10/22/2011  . Community acquired bacterial pneumonia 10/21/2011  . SOB (shortness of breath) 10/21/2011  . N&V (nausea and vomiting) 10/21/2011  . ARF (acute renal failure) (HCC) 10/21/2011  . Tobacco abuse 10/21/2011  . Leukocytosis 10/21/2011  . Fever 10/21/2011    History reviewed. No pertinent surgical history.     Home Medications    Prior to Admission medications   Medication Sig Start Date End Date Taking? Authorizing Provider  esomeprazole (NEXIUM) 40 MG capsule Take 1 capsule (40 mg total) by  mouth daily. 01/23/17  Yes Cheron Schaumann K, PA-C  naproxen (NAPROSYN) 250 MG tablet Take 250 mg by mouth every 6 (six) hours as needed for mild pain.   Yes [provider]  diltiazem (CARDIZEM) 90 MG tablet Take 1 tablet (90 mg total) by mouth every 12 (twelve) hours. Patient not taking: Reported on 08/13/2017 03/24/13 08/23/14  Lars Masson, MD  ibuprofen (ADVIL,MOTRIN) 600 MG tablet Take 1 tablet (600 mg total) by mouth every 6 (six) hours as needed. Patient not taking: Reported on 08/13/2017 01/15/15   Loren Racer, MD  methocarbamol (ROBAXIN) 500 MG tablet Can take up to 1-2 tabs every 6 hours PRN PAIN Patient not taking: Reported on 08/13/2017 07/28/16   Pisciotta, Joni Reining, PA-C  predniSONE (DELTASONE) 50 MG tablet Take 1 tablet daily with breakfast Patient not taking: Reported on 08/13/2017 12/18/15   Pisciotta, Joni Reining, PA-C  tobramycin (TOBREX) 0.3 % ophthalmic solution Place 2 drops into the right eye every 4 (four) hours. Patient not taking: Reported on 08/13/2017 01/23/17   Osie Cheeks    Family History Family History  Problem Relation Age of Onset  . Heart disease Neg Hx     Social History Social History   Tobacco Use  . Smoking status: Former Games developer  . Smokeless tobacco: Never Used  . Tobacco comment: Quit April 2013  Substance Use Topics  . Alcohol use: No  Comment: Previously drank heavily, has been sober for 14 months  . Drug use: No    Comment: As of 10/2011, has been clean from crack cocaine for 5 years. Hx of marijuana.     Allergies   Patient has no known allergies.   Review of Systems Review of Systems  Constitutional: Negative for fever.  HENT: Positive for congestion, sinus pressure and sinus pain. Negative for sore throat.   Eyes: Negative for redness.  Respiratory: Positive for cough. Negative for shortness of breath.   Cardiovascular: Negative for chest pain.  Gastrointestinal: Negative for abdominal pain.  Genitourinary: Negative for  flank pain.  Musculoskeletal: Positive for back pain.  Skin: Negative for rash.  Neurological: Negative for weakness and numbness.  Hematological: Does not bruise/bleed easily.  Psychiatric/Behavioral: Negative for confusion.     Physical Exam Updated Vital Signs BP (!) 114/101 (BP Location: Left Arm)   Pulse 84   Temp 97.8 F (36.6 C) (Oral)   Resp 18   SpO2 98%   Physical Exam  Constitutional: He appears well-developed and well-nourished. No distress.  HENT:  Mouth/Throat: Oropharynx is clear and moist.  +nasal congestion, and sinus pain/tenderness.  No facial swelling.   Eyes: Conjunctivae are normal.  Neck: Neck supple. No tracheal deviation present.  No stiffness/rigidity.   Cardiovascular: Normal rate, regular rhythm, normal heart sounds and intact distal pulses.  Pulmonary/Chest: Effort normal and breath sounds normal. No accessory muscle usage. No respiratory distress.  Abdominal: Soft. He exhibits no distension. There is no tenderness.  Musculoskeletal: He exhibits no edema.  TLS spine non tender, aligned. Lumbar muscular tenderness.   Neurological: He is alert.  Speech clear. Steady gait.   Skin: Skin is warm and dry. No rash noted. He is not diaphoretic.  Psychiatric: He has a normal mood and affect.  Nursing note and vitals reviewed.    ED Treatments / Results  Labs (all labs ordered are listed, but only abnormal results are displayed) Labs Reviewed - No data to display  EKG  EKG Interpretation None       Radiology Dg Chest 2 View  Result Date: 08/13/2017 CLINICAL DATA:  Cough for 2 days EXAM: CHEST  2 VIEW COMPARISON:  09/01/2015 FINDINGS: Cardiac shadow is within normal limits. The lungs are well aerated bilaterally. No focal infiltrate or sizable effusion is seen. No acute bony abnormality is noted. IMPRESSION: No active cardiopulmonary disease. Electronically Signed   By: Alcide CleverMark  Lukens M.D.   On: 08/13/2017 09:28    Procedures Procedures  (including critical care time)  Medications Ordered in ED Medications - No data to display   Initial Impression / Assessment and Plan / ED Course  I have reviewed the triage vital signs and the nursing notes.  Pertinent labs & imaging results that were available during my care of the patient were reviewed by me and considered in my medical decision making (see chart for details).  Sinus pressure/tenderness, suspect sinusitis.   Confirmed nkda w pt.   rx amoxicillin.   Reviewed nursing notes and prior charts for additional history.     Final Clinical Impressions(s) / ED Diagnoses   Final diagnoses:  None    ED Discharge Orders    None       Cathren LaineSteinl, Baylea Milburn, MD 08/13/17 1021

## 2017-09-12 IMAGING — CR DG CHEST 2V
2 series · 2 of 2 positions shown · non-contrast
Comparison: PA and lateral chest 10/21/2011 and 09/05/2004.

CLINICAL DATA: Cough and congestion for 3 weeks. Initial encounter.

EXAM:
CHEST  2 VIEW

[w chest pa]
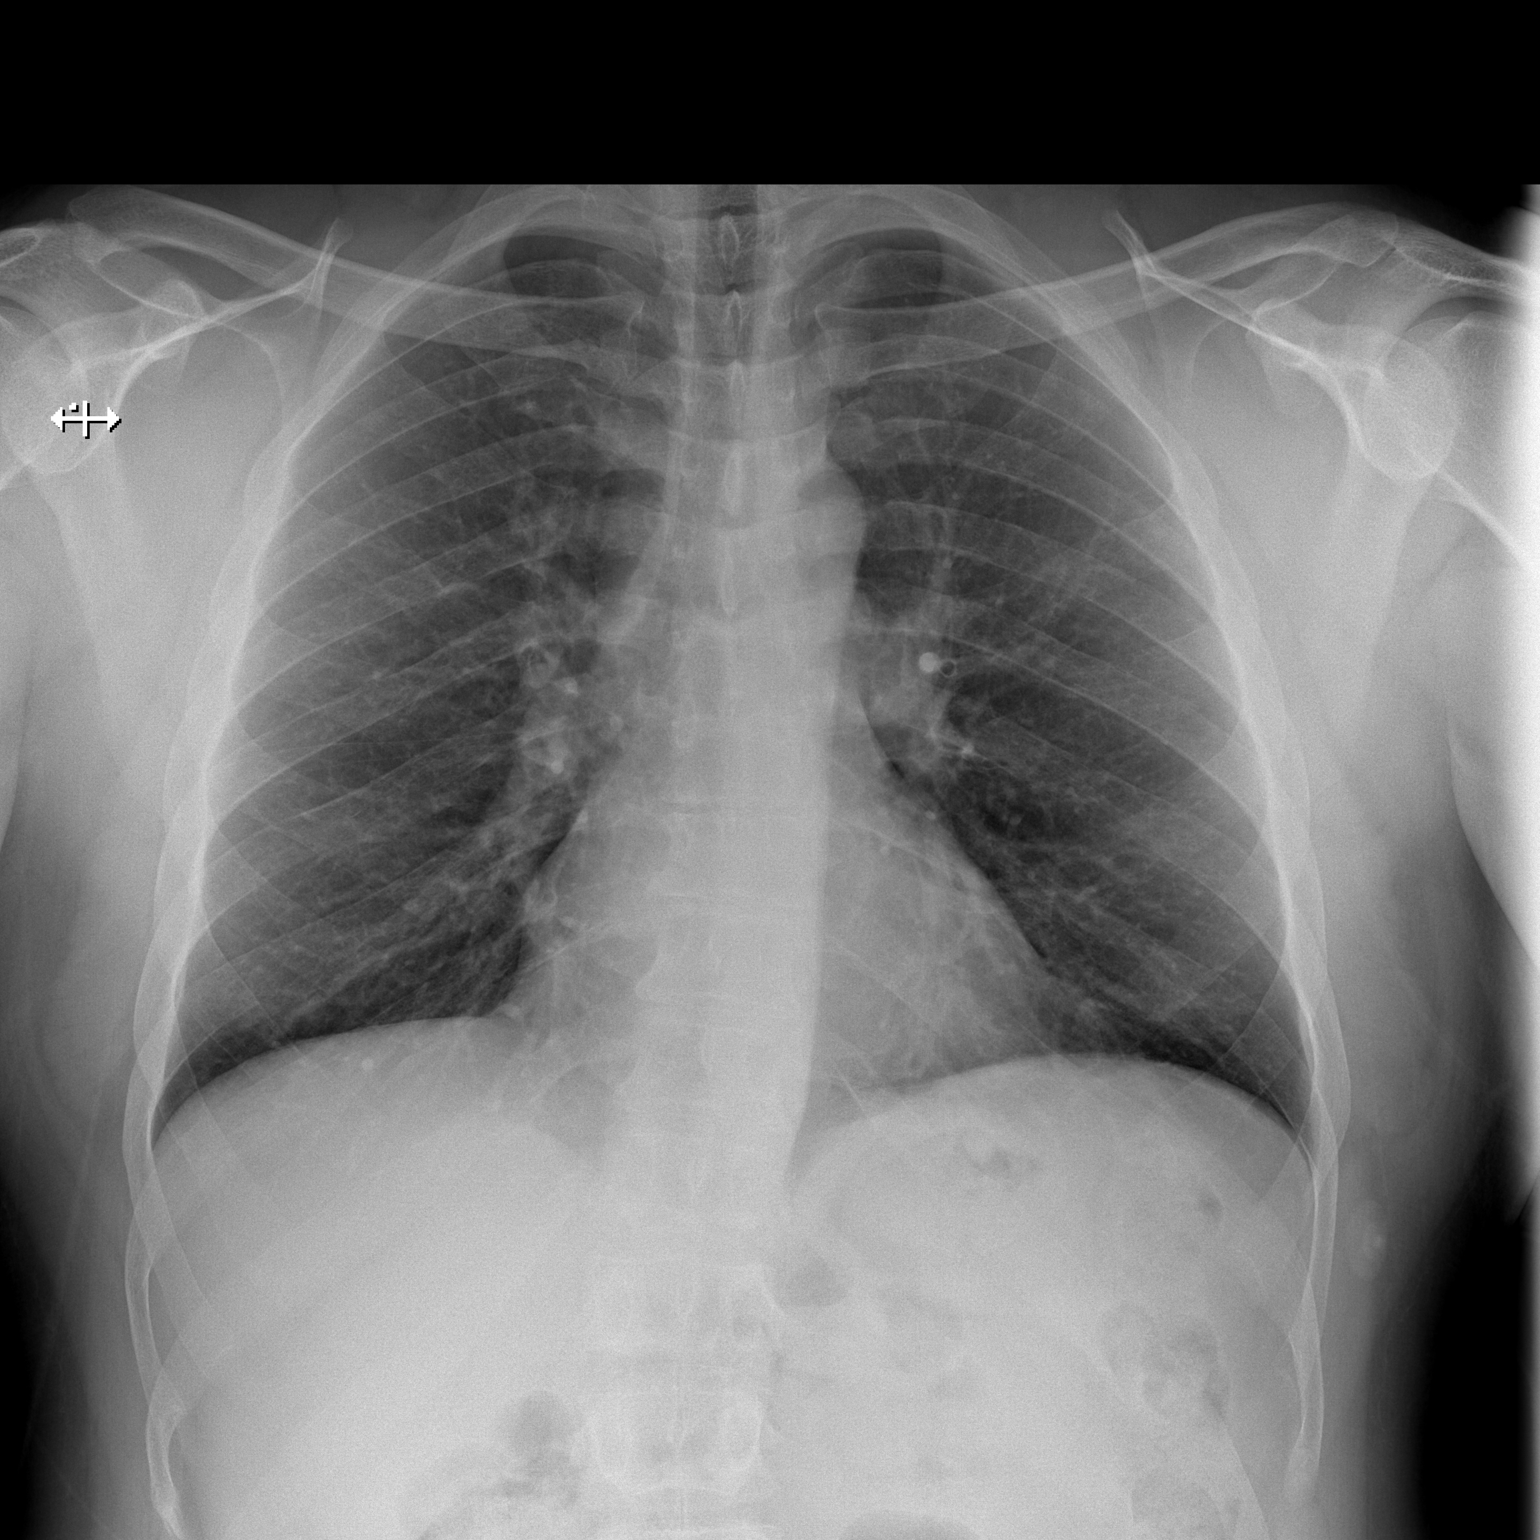

[w chest lat]
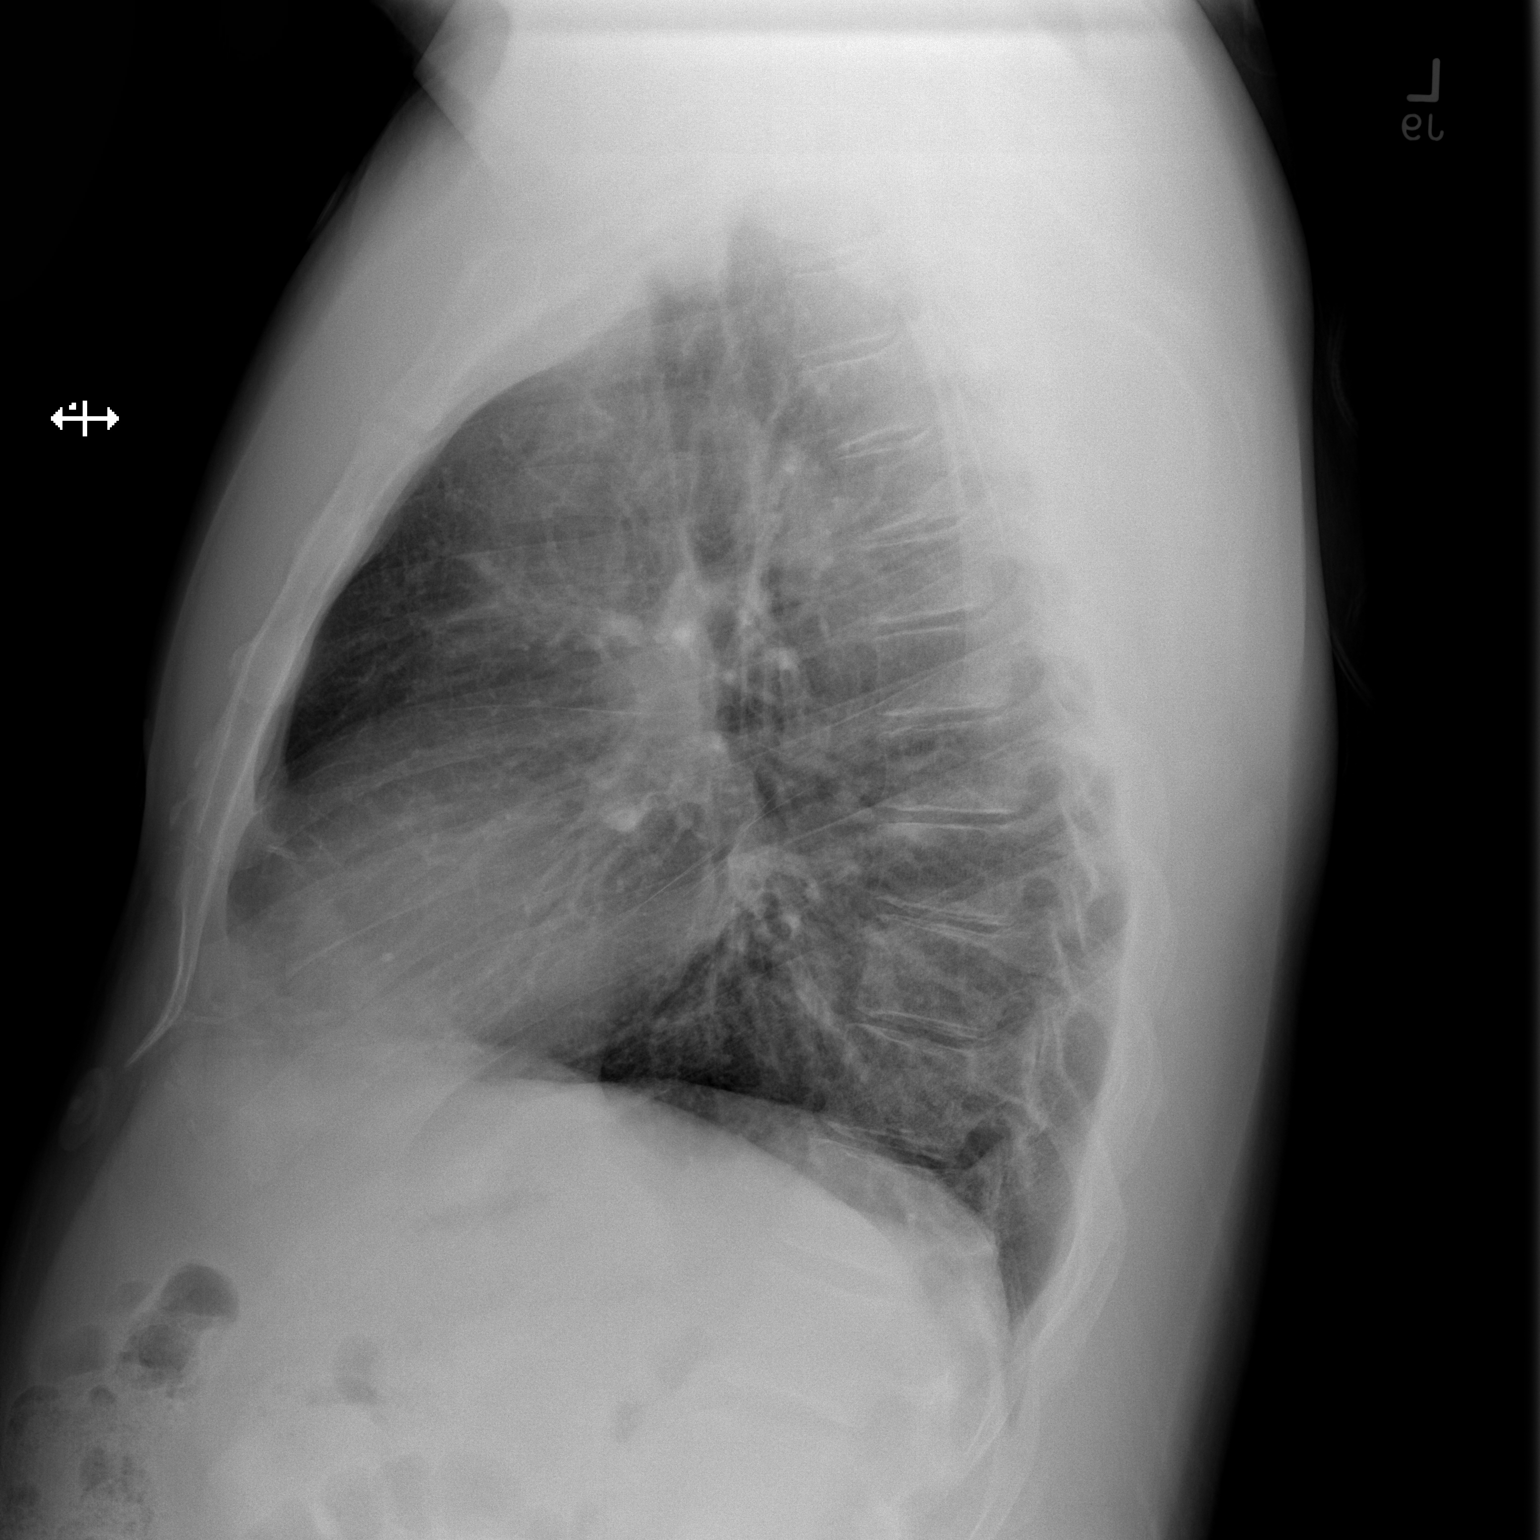

[2 of 2 positions shown; findings below may reference images not displayed]

FINDINGS: The lungs are clear. Heart size is normal. There is no pneumothorax
or pleural effusion. No bony abnormality is identified.
IMPRESSION: Negative exam.

## 2018-02-27 ENCOUNTER — Encounter (HOSPITAL_COMMUNITY): Payer: Self-pay | Admitting: Emergency Medicine

## 2018-02-27 ENCOUNTER — Emergency Department (HOSPITAL_COMMUNITY)
Admission: EM | Admit: 2018-02-27 | Discharge: 2018-02-27 | Disposition: A | Discharge status: Home / Self Care | Payer: No Typology Code available for payment source | Attending: Emergency Medicine | Admitting: Emergency Medicine

## 2018-02-27 DIAGNOSIS — K644 Residual hemorrhoidal skin tags: Secondary | ICD-10-CM | POA: Insufficient documentation

## 2018-02-27 DIAGNOSIS — I4892 Unspecified atrial flutter: Secondary | ICD-10-CM | POA: Insufficient documentation

## 2018-02-27 DIAGNOSIS — I5032 Chronic diastolic (congestive) heart failure: Secondary | ICD-10-CM | POA: Insufficient documentation

## 2018-02-27 DIAGNOSIS — Z87891 Personal history of nicotine dependence: Secondary | ICD-10-CM | POA: Insufficient documentation

## 2018-02-27 MED ORDER — HYDROCORTISONE 2.5 % RE CREA: TOPICAL_CREAM | RECTAL | 1 refills | Status: DC

## 2018-02-27 NOTE — ED Triage Notes (Signed)
Pt reports has hemorrhoids for 3 weeks. Reports will have some pain and bleeding with BMs.

## 2018-02-27 NOTE — ED Provider Notes (Signed)
Emergency Department Provider Note   I have reviewed the triage vital signs and the nursing notes.   HISTORY  Chief Complaint Hemorrhoids   HPI Gunnar BullaCharles Erman is a 52 y.o. male with PMH of dCHF, polysubstance abuse, and hemorrhoids presents to the emergency department for evaluation of worsening rectal pain over the past 3 weeks.  He is noticed some blood in the bowel movements.  He states he noticed hemorrhoids developing over the past 3 weeks that have become progressively more painful.  He is noticed a small amount of bright red blood in the bowel movements.  He is tried over-the-counter medication with no relief in symptoms.  He continues to have bowel movements daily and denies constipation is a dominant symptom.  No nausea or vomiting.  No fevers or chills.    Past Medical History:  Diagnosis Date  . ARF (acute renal failure) (HCC)   . Atrial flutter (HCC)   . Community acquired bacterial pneumonia   . Diastolic CHF, chronic (HCC)   . Leukocytosis   . N&V (nausea and vomiting)   . SOB (shortness of breath)   . Substance abuse (HCC)    Prior hx of cocaine (quit 5 years ago)/EtOH (clean for 14 months) as of 10/2011    Patient Active Problem List   Diagnosis Date Noted  . Substance abuse (HCC)   . Diastolic CHF, chronic   . Diastolic CHF, chronic (HCC) 10/24/2011  . Atrial flutter (HCC) 10/22/2011  . Community acquired bacterial pneumonia 10/21/2011  . SOB (shortness of breath) 10/21/2011  . N&V (nausea and vomiting) 10/21/2011  . ARF (acute renal failure) (HCC) 10/21/2011  . Tobacco abuse 10/21/2011  . Leukocytosis 10/21/2011  . Fever 10/21/2011    History reviewed. No pertinent surgical history.  Allergies Patient has no known allergies.  Family History  Problem Relation Age of Onset  . Heart disease Neg Hx     Social History Social History   Tobacco Use  . Smoking status: Former Games developermoker  . Smokeless tobacco: Never Used  . Tobacco comment: Quit  April 2013  Substance Use Topics  . Alcohol use: No    Comment: Previously drank heavily, has been sober for 14 months  . Drug use: No    Comment: As of 10/2011, has been clean from crack cocaine for 5 years. Hx of marijuana.    Review of Systems  Constitutional: No fever/chills Eyes: No visual changes. ENT: No sore throat. Cardiovascular: Denies chest pain. Respiratory: Denies shortness of breath. Gastrointestinal: No abdominal pain.  No nausea, no vomiting.  No diarrhea.  No constipation. Positive rectal pain.  Genitourinary: Negative for dysuria. Musculoskeletal: Negative for back pain. Skin: Negative for rash. Neurological: Negative for headaches, focal weakness or numbness.  10-point ROS otherwise negative.  ____________________________________________   PHYSICAL EXAM:  VITAL SIGNS: ED Triage Vitals  Enc Vitals Group     BP 02/27/18 1034 124/83     Pulse Rate 02/27/18 1034 78     Resp 02/27/18 1034 17     Temp 02/27/18 1034 98.5 F (36.9 C)     Temp Source 02/27/18 1034 Oral     SpO2 02/27/18 1034 97 %     Weight 02/27/18 1034 234 lb (106.1 kg)     Height 02/27/18 1034 5\' 10"  (1.778 m)     Pain Score 02/27/18 1033 10   Constitutional: Alert and oriented. Well appearing and in no acute distress. Eyes: Conjunctivae are normal.  Head: Atraumatic. Nose: No congestion/rhinnorhea.  Mouth/Throat: Mucous membranes are moist.   Neck: No stridor.  Cardiovascular: Normal rate, regular rhythm. Good peripheral circulation. Grossly normal heart sounds.   Respiratory: Normal respiratory effort.  No retractions. Lungs CTAB. Gastrointestinal: Soft and nontender. No distention. Rectal exam with two external hemorrhoids. No thrombosed hemorrhoids. No active bleeding.  Musculoskeletal: No lower extremity tenderness nor edema. No gross deformities of extremities. Neurologic:  Normal speech and language. No gross focal neurologic deficits are appreciated.  Skin:  Skin is warm, dry  and intact. No rash noted. ____________________________________________  RADIOLOGY  None ____________________________________________   PROCEDURES  Procedure(s) performed:   Procedures  None ____________________________________________   INITIAL IMPRESSION / ASSESSMENT AND PLAN / ED COURSE  Pertinent labs & imaging results that were available during my care of the patient were reviewed by me and considered in my medical decision making (see chart for details).  Patient here with hemorrhoids for the past 3 weeks.  He has 2 external hemorrhoids visible on exam.  These are not thrombosed or actively bleeding.  Not consistent with a perirectal abscess.  Plan for Anusol cream and PCP follow-up.  Will provide contact information for for general surgery for consideration of hemorrhoidectomy.   At this time, I do not feel there is any life-threatening condition present. I have reviewed and discussed all results (EKG, imaging, lab, urine as appropriate), exam findings with patient. I have reviewed nursing notes and appropriate previous records.  I feel the patient is safe to be discharged home without further emergent workup. Discussed usual and customary return precautions. Patient and family (if present) verbalize understanding and are comfortable with this plan.  Patient will follow-up with their primary care provider. If they do not have a primary care provider, information for follow-up has been provided to them. All questions have been answered.  ____________________________________________  FINAL CLINICAL IMPRESSION(S) / ED DIAGNOSES  Final diagnoses:  External hemorrhoid    NEW OUTPATIENT MEDICATIONS STARTED DURING THIS VISIT:  New Prescriptions   HYDROCORTISONE (ANUSOL-HC) 2.5 % RECTAL CREAM    Apply rectally 2 times daily    Note:  This document was prepared using Dragon voice recognition software and may include unintentional dictation errors.  Alona BeneJoshua Long, MD Emergency  Medicine    Long, Arlyss RepressJoshua G, MD 02/27/18 [id number]

## 2018-02-27 NOTE — Discharge Instructions (Signed)
You were seen in the ED with hemorrhoids. We are starting some hemorrhoid cream. Call your PCP today and make the appointment with general surgery listed. Return to the ED with any new or worsening symptoms.

## 2018-03-04 ENCOUNTER — Emergency Department (HOSPITAL_COMMUNITY)
Admission: EM | Admit: 2018-03-04 | Discharge: 2018-03-04 | Disposition: A | Discharge status: Home / Self Care | Payer: No Typology Code available for payment source | Attending: Emergency Medicine | Admitting: Emergency Medicine

## 2018-03-04 ENCOUNTER — Encounter (HOSPITAL_COMMUNITY): Payer: Self-pay | Admitting: Emergency Medicine

## 2018-03-04 DIAGNOSIS — Z79899 Other long term (current) drug therapy: Secondary | ICD-10-CM | POA: Insufficient documentation

## 2018-03-04 DIAGNOSIS — Z87891 Personal history of nicotine dependence: Secondary | ICD-10-CM | POA: Insufficient documentation

## 2018-03-04 DIAGNOSIS — I5032 Chronic diastolic (congestive) heart failure: Secondary | ICD-10-CM | POA: Insufficient documentation

## 2018-03-04 DIAGNOSIS — K645 Perianal venous thrombosis: Secondary | ICD-10-CM | POA: Insufficient documentation

## 2018-03-04 DIAGNOSIS — I110 Hypertensive heart disease with heart failure: Secondary | ICD-10-CM | POA: Insufficient documentation

## 2018-03-04 MED ORDER — OXYCODONE-ACETAMINOPHEN 5-325 MG PO TABS
1.0000 | ORAL_TABLET | Freq: Once | ORAL | Status: AC
  Administered 2018-03-04: 1 via ORAL
  Filled 2018-03-04: qty 1

## 2018-03-04 MED ORDER — ONDANSETRON 4 MG PO TBDP
4.0000 mg | ORAL_TABLET | Freq: Once | ORAL | Status: AC
  Administered 2018-03-04: 4 mg via ORAL
  Filled 2018-03-04: qty 1

## 2018-03-04 MED ORDER — LIDOCAINE HCL (PF) 1 % IJ SOLN
30.0000 mL | Freq: Once | INTRAMUSCULAR | Status: AC
  Administered 2018-03-04: 30 mL
  Filled 2018-03-04: qty 30

## 2018-03-04 MED ORDER — HYDROCORTISONE ACETATE 25 MG RE SUPP
25.0000 mg | Freq: Two times a day (BID) | RECTAL | Status: DC
  Administered 2018-03-04: 25 mg via RECTAL
  Filled 2018-03-04: qty 1

## 2018-03-04 NOTE — Discharge Instructions (Addendum)
I was able to remove 2 blood clots from your hemorrhoid today and drain a lot of blood. You should begin to feel better from that.   I recommend that you call [facility] and schedule an appointment to follow-up with them and talk about other treatment options (such as removal).  Be sure to keep your bottom very clean not that there is an open area there. Continue to do the warm soaks for relief. I also recommend that you take a stool softener to prevent constipation which can worsen hemorrhoids. For pain, you can do Tylenol and/or Ibuprofen.  Follow-up with a medical provider if you have one or more of the following symptoms: fever; increased redness, warmth or tenderness at the wound site; pain beyond where the initial wound was; unusual discharge; abdominal pain; worsening rectal pain.  Thank you for allowing me to take care of you today!

## 2018-03-04 NOTE — ED Triage Notes (Signed)
PT has had hemorrhoids since 1990. This flare up has been going on for 3 weeks. [facility] and [facility] prescription. PT reports med cost $70 and he could not fill it. PT has been taking tylenol, ibuprofen, and using an OTC cream. PT reports some bleeding on tissue paper when he wipes area. No copious bleeding or bleeding that has soaked through his clothing.

## 2018-03-04 NOTE — ED Provider Notes (Signed)
[facility] Provider Note  CSN: [id number] Arrival date & time: 03/04/18  1110    History   Chief Complaint Chief Complaint  Patient presents with  . Hemorrhoids    HPI [name] [name] is a 52 y.o. male with a medical history of atrial flutter and CHF who presented to the ED for hemorrhoids x3 weeks. Patient describes rectal pain and bleeding that continues to worsen. Associated symptoms: blood in stool. He reports using OTC creams and wipes daily since this began with minimal relief. Patient states that he went to the ED on 02/27/18 for the same and was discharged with a Rx for hemorrhoid cream which he could not afford and a referral [facility]. Denies fever, chills, abdominal pain, change in bowel movements or urinary complaints.   Past Medical History:  Diagnosis Date  . ARF (acute renal failure) (HCC)   . Atrial flutter (HCC)   . Community acquired bacterial pneumonia   . Diastolic CHF, chronic (HCC)   . Leukocytosis   . N&V (nausea and vomiting)   . SOB (shortness of breath)   . Substance abuse (HCC)    Prior hx of cocaine (quit 5 years ago)/EtOH (clean for 14 months) as of 10/2011    Patient Active Problem List   Diagnosis Date Noted  . Substance abuse (HCC)   . Diastolic CHF, chronic   . Diastolic CHF, chronic (HCC) 10/24/2011  . Atrial flutter (HCC) 10/22/2011  . Community acquired bacterial pneumonia 10/21/2011  . SOB (shortness of breath) 10/21/2011  . N&V (nausea and vomiting) 10/21/2011  . ARF (acute renal failure) (HCC) 10/21/2011  . Tobacco abuse 10/21/2011  . Leukocytosis 10/21/2011  . Fever 10/21/2011    History reviewed. No pertinent surgical history.      Home Medications    Prior to Admission medications   Medication Sig Start Date End Date Taking? Authorizing Provider  amoxicillin (AMOXIL) 500 MG capsule Take 1 capsule (500 mg total) by mouth 3 (three) times daily. Patient not taking:  Reported on 02/27/2018 08/13/17   Cathren LaineSteinl, Kevin, MD  diltiazem (CARDIZEM) 90 MG tablet Take 1 tablet (90 mg total) by mouth every 12 (twelve) hours. Patient not taking: Reported on 08/13/2017 03/24/13 08/23/14  Lars MassonNelson, Katarina H, MD  esomeprazole (NEXIUM) 40 MG capsule Take 1 capsule (40 mg total) by mouth daily. Patient not taking: Reported on 02/27/2018 01/23/17   Elson AreasSofia, Leslie K, PA-C  hydrocortisone (ANUSOL-HC) 2.5 % rectal cream Apply rectally 2 times daily 02/27/18   Long, Arlyss RepressJoshua G, MD  ibuprofen (ADVIL,MOTRIN) 600 MG tablet Take 1 tablet (600 mg total) by mouth every 6 (six) hours as needed. Patient not taking: Reported on 08/13/2017 01/15/15   Loren RacerYelverton, David, MD  methocarbamol (ROBAXIN) 500 MG tablet Can take up to 1-2 tabs every 6 hours PRN PAIN Patient not taking: Reported on 08/13/2017 07/28/16   Pisciotta, Joni ReiningNicole, PA-C  naproxen (NAPROSYN) 250 MG tablet Take 250 mg by mouth every 6 (six) hours as needed for mild pain.    [provider]  predniSONE (DELTASONE) 50 MG tablet Take 1 tablet daily with breakfast Patient not taking: Reported on 08/13/2017 12/18/15   Pisciotta, Joni ReiningNicole, PA-C  tobramycin (TOBREX) 0.3 % ophthalmic solution Place 2 drops into the right eye every 4 (four) hours. Patient not taking: Reported on 08/13/2017 01/23/17   Osie CheeksSofia, Leslie K, PA-C    Family History Family History  Problem Relation Age of Onset  . Heart disease Neg Hx  Social History Social History   Tobacco Use  . Smoking status: Former Games developer  . Smokeless tobacco: Never Used  . Tobacco comment: Quit April 2013  Substance Use Topics  . Alcohol use: No    Comment: Previously drank heavily, has been sober for 14 months  . Drug use: No    Comment: As of 10/2011, has been clean from crack cocaine for 5 years. Hx of marijuana.     Allergies   Patient has no known allergies.   Review of Systems Review of Systems  Constitutional: Negative for chills and fever.  Gastrointestinal: Positive for blood  in stool and rectal pain. Negative for abdominal pain, constipation, diarrhea, nausea and vomiting.       Hemorrhoids  Genitourinary: Negative.   Musculoskeletal: Negative.   Skin: Negative.   Hematological: Does not bruise/bleed easily.     Physical Exam Updated Vital Signs BP (!) 129/93   Pulse 78   Temp 97.7 F (36.5 C) (Oral)   Resp 16   Wt 104.3 kg   SpO2 99%   BMI 33.00 kg/m   Physical Exam  Constitutional:  Non-toxic appearance. He does not have a sickly appearance.  Obese.  Abdominal: Soft. Normal appearance and bowel sounds are normal. There is no tenderness.  Genitourinary: Rectal exam shows external hemorrhoid.     Skin: Skin is warm and intact. Capillary refill takes less than 2 seconds. No erythema. No pallor.  Nursing note and vitals reviewed.    ED Treatments / Results  Labs (all labs ordered are listed, but only abnormal results are displayed) Labs Reviewed - No data to display  EKG None  Radiology No results found.  Procedures .Marland KitchenIncision and Drainage Date/Time: 03/04/2018 12:51 PM Performed by: Windy Carina, PA-C Authorized by: Windy Carina, PA-C   Consent:    Consent obtained:  Verbal   Consent given by:  Patient   Risks discussed:  Bleeding, incomplete drainage and infection   Alternatives discussed:  Delayed treatment and referral Location:    Type:  External thrombosed hemorrhoid   Size:  5cm   Location:  Anogenital   Anogenital location:  Perirectal Pre-procedure details:    Skin preparation:  Betadine Anesthesia (see MAR for exact dosages):    Anesthesia method:  Local infiltration   Local anesthetic:  Lidocaine 1% w/o epi Procedure type:    Complexity:  Simple Procedure details:    Incision types:  Single straight   Incision depth:  Submucosal   Scalpel blade:  11   Wound management:  Probed and deloculated and irrigated with saline   Drainage:  Bloody   Drainage amount:  Copious   Wound treatment:  Wound  left open   Packing materials:  None Post-procedure details:    Patient tolerance of procedure:  Tolerated well, no immediate complications Comments:     Two ~1cm blood clots removed from hemorrhoid.   (including critical care time)  Medications Ordered in ED Medications  lidocaine (PF) (XYLOCAINE) 1 % injection 30 mL (30 mLs Infiltration Given by Other 03/04/18 1204)  oxyCODONE-acetaminophen (PERCOCET/ROXICET) 5-325 MG per tablet 1 tablet (1 tablet Oral Given 03/04/18 1204)  ondansetron (ZOFRAN-ODT) disintegrating tablet 4 mg (4 mg Oral Given 03/04/18 1204)     Initial Impression / Assessment and Plan / ED Course  Triage vital signs and the nursing notes have been reviewed.  Pertinent labs & imaging results that were available during care of the patient were reviewed and considered in medical decision making (  see chart for details).   Patient presents with external hemorrhoids. He is afebrile and not sickly appearing. Patient states he has tried conservative measures including Sitz baths and OTC creams and wipes for 3 weeks no significant relief in rectal pain. He states that the pain is so severe that he has had to miss work. Patient seen in the ED on 02/27/18 for complaint. At that time, the provider did not seen any thrombosed hemorrhoids. However, on exam today a 5 cm thrombosed external hemorrhoid was seen on exam. I&D was performed and 2 clots were evacuated and patient endorse signifcant relief with blood evacuated. Given the size of the hemorrhoid, it is recommended patient follow-up with general surgery for hemorrhoidectomy.  Final Clinical Impressions(s) / ED Diagnoses  1. Thrombosed External Hemorrhoid. I&D performed with 2 blood clots evacuated along with copious amount of blood. Advised to follow-up with [facility]. Education provided on OTC and supportive treatment. Education provided on wound care, follow-up and s/s of infection that warrant sooner medical  follow-up.  Dispo: Home. After thorough clinical evaluation, this patient is determined to be medically stable and can be safely discharged with the previously mentioned treatment and/or outpatient follow-up/referral(s). At this time, there are no other apparent medical conditions that require further screening, evaluation or treatment.   Final diagnoses:  Thrombosed external hemorrhoid    ED Discharge Orders    None        Windy Carina, [place] 03/04/18 1313    Pricilla Loveless, MD 03/05/18 1148

## 2018-12-29 ENCOUNTER — Emergency Department (HOSPITAL_COMMUNITY)
Admission: EM | Admit: 2018-12-29 | Discharge: 2018-12-29 | Disposition: A | Discharge status: Home / Self Care | Payer: No Typology Code available for payment source | Attending: Emergency Medicine | Admitting: Emergency Medicine

## 2018-12-29 ENCOUNTER — Other Ambulatory Visit: Payer: Self-pay

## 2018-12-29 DIAGNOSIS — Z20822 Contact with and (suspected) exposure to covid-19: Secondary | ICD-10-CM

## 2018-12-29 DIAGNOSIS — Z87891 Personal history of nicotine dependence: Secondary | ICD-10-CM | POA: Diagnosis not present

## 2018-12-29 DIAGNOSIS — I5032 Chronic diastolic (congestive) heart failure: Secondary | ICD-10-CM | POA: Insufficient documentation

## 2018-12-29 DIAGNOSIS — Z20828 Contact with and (suspected) exposure to other viral communicable diseases: Secondary | ICD-10-CM

## 2018-12-29 DIAGNOSIS — R070 Pain in throat: Secondary | ICD-10-CM | POA: Diagnosis present

## 2018-12-29 DIAGNOSIS — U071 COVID-19: Secondary | ICD-10-CM | POA: Insufficient documentation

## 2018-12-29 NOTE — ED Triage Notes (Signed)
Pt had recent contact with coworker who tested + for covid. Pt endorses feeling unwell with headache, sore throat, and dry cough since yesterday. Manager is making him get tested.

## 2018-12-29 NOTE — ED Provider Notes (Signed)
[place] EMERGENCY DEPARTMENT Provider Note   CSN: [id number] Arrival date & time: 12/29/18  1221     History   Chief Complaint No chief complaint on file.   HPI [name] [name] is a 53 y.o. male.     The history is provided by the patient and medical records. No language interpreter was used.     53 year old male with history of tobacco abuse, CHF, presenting with recent exposure to someone that has positive COVID-19.  Patient states since yesterday developed headache, dry cough, sore throat.  He noticed the symptoms after he was notified that his coworker has tested positive for COVID-19 several days ago.  Patient states he was working in close contact with a coworker 4 days ago.  He denies any significant shortness of breath or fever.  He states his workplace would like for him to be tested for COVID-19.  No report of loss of taste or smell.  His wife is also here to get tested as they stay in close contact.  Past Medical History:  Diagnosis Date  . ARF (acute renal failure) ([place])   . Atrial flutter ([place])   . Community acquired bacterial pneumonia   . Diastolic CHF, chronic ([place])   . Leukocytosis   . N&V (nausea and vomiting)   . SOB (shortness of breath)   . Substance abuse ([place])    Prior hx of cocaine (quit 5 years ago)/EtOH (clean for 14 months) as of 10/2011    Patient Active Problem List   Diagnosis Date Noted  . Substance abuse ([place])   . Diastolic CHF, chronic   . Diastolic CHF, chronic ([place]) 10/24/2011  . Atrial flutter ([place]) 10/22/2011  . Community acquired bacterial pneumonia 10/21/2011  . SOB (shortness of breath) 10/21/2011  . N&V (nausea and vomiting) 10/21/2011  . ARF (acute renal failure) ([place]) 10/21/2011  . Tobacco abuse 10/21/2011  . Leukocytosis 10/21/2011  . Fever 10/21/2011    No past surgical history on file.      Home Medications    Prior to Admission medications   Medication Sig Start Date End Date Taking?  Authorizing Provider  amoxicillin (AMOXIL) 500 MG capsule Take 1 capsule (500 mg total) by mouth 3 (three) times daily. Patient not taking: Reported on 02/27/2018 08/13/17   Lajean Saver, MD  diltiazem (CARDIZEM) 90 MG tablet Take 1 tablet (90 mg total) by mouth every 12 (twelve) hours. Patient not taking: Reported on 08/13/2017 03/24/13 08/23/14  Dorothy Spark, MD  esomeprazole (NEXIUM) 40 MG capsule Take 1 capsule (40 mg total) by mouth daily. Patient not taking: Reported on 02/27/2018 01/23/17   Fransico Meadow, PA-C  hydrocortisone (ANUSOL-HC) 2.5 % rectal cream Apply rectally 2 times daily 02/27/18   Long, Wonda Olds, MD  ibuprofen (ADVIL,MOTRIN) 600 MG tablet Take 1 tablet (600 mg total) by mouth every 6 (six) hours as needed. Patient not taking: Reported on 08/13/2017 01/15/15   Julianne Rice, MD  methocarbamol (ROBAXIN) 500 MG tablet Can take up to 1-2 tabs every 6 hours PRN PAIN Patient not taking: Reported on 08/13/2017 07/28/16   Pisciotta, Elmyra Ricks, PA-C  naproxen (NAPROSYN) 250 MG tablet Take 250 mg by mouth every 6 (six) hours as needed for mild pain.    [provider]  predniSONE (DELTASONE) 50 MG tablet Take 1 tablet daily with breakfast Patient not taking: Reported on 08/13/2017 12/18/15   Pisciotta, Elmyra Ricks, PA-C  tobramycin (TOBREX) 0.3 % ophthalmic solution Place 2 drops into the right  eye every 4 (four) hours. Patient not taking: Reported on 08/13/2017 01/23/17   Osie CheeksSofia, Leslie K, PA-C    Family History Family History  Problem Relation Age of Onset  . Heart disease Neg Hx     Social History Social History   Tobacco Use  . Smoking status: Former Games developermoker  . Smokeless tobacco: Never Used  . Tobacco comment: Quit April 2013  Substance Use Topics  . Alcohol use: No    Comment: Previously drank heavily, has been sober for 14 months  . Drug use: No    Comment: As of 10/2011, has been clean from crack cocaine for 5 years. Hx of marijuana.     Allergies   Patient has no known  allergies.   Review of Systems Review of Systems  All other systems reviewed and are negative.    Physical Exam Updated Vital Signs BP (!) 155/90 (BP Location: Right Arm)   Pulse 85   Temp 98 F (36.7 C) (Oral)   Ht 5\' 10"  (1.778 m)   Wt 107.5 kg   SpO2 98%   BMI 34.01 kg/m   Physical Exam Vitals signs and nursing note reviewed.  Constitutional:      General: He is not in acute distress.    Appearance: He is well-developed.  HENT:     Head: Atraumatic.     Nose: Nose normal.  Eyes:     Conjunctiva/sclera: Conjunctivae normal.  Neck:     Musculoskeletal: Neck supple.  Cardiovascular:     Rate and Rhythm: Normal rate and regular rhythm.  Pulmonary:     Effort: Pulmonary effort is normal.     Breath sounds: Normal breath sounds.  Abdominal:     Palpations: Abdomen is soft.  Skin:    Findings: No rash.  Neurological:     Mental Status: He is alert and oriented to person, place, and time.      ED Treatments / Results  Labs (all labs ordered are listed, but only abnormal results are displayed) Labs Reviewed  NOVEL CORONAVIRUS, NAA (HOSPITAL ORDER, SEND-OUT TO REF LAB)    EKG    Radiology No results found.  Procedures Procedures (including critical care time)  Medications Ordered in ED Medications - No data to display   Initial Impression / Assessment and Plan / ED Course  I have reviewed the triage vital signs and the nursing notes.  Pertinent labs & imaging results that were available during my care of the patient were reviewed by me and considered in my medical decision making (see chart for details).        BP (!) 155/90 (BP Location: Right Arm)   Pulse 85   Temp 98 F (36.7 C) (Oral)   Ht 5\' 10"  (1.778 m)   Wt 107.5 kg   SpO2 98%   BMI 34.01 kg/m    Final Clinical Impressions(s) / ED Diagnoses   Final diagnoses:  Close Exposure to Covid-19 Virus    ED Discharge Orders    None     1:02 PM Patient report he was recently in  close contact with a coworker that test positive for COVID-19.  He does report some mild viral-like symptoms however he is well-appearing no hypoxia and no fever.  No significant cough.  Will obtain COVID-19 testing, encourage patient to self quarantine for 14 days.  Work note provided.  Return precaution discussed.  [name] [name] was evaluated in Emergency Department on 12/29/2018 for the symptoms described in the history of  present illness. He was evaluated in the context of the global COVID-19 pandemic, which necessitated consideration that the patient might be at risk for infection with the SARS-CoV-2 virus that causes COVID-19. Institutional protocols and algorithms that pertain to the evaluation of patients at risk for COVID-19 are in a state of rapid change based on information released by regulatory bodies including the CDC and federal and state organizations. These policies and algorithms were followed during the patient's care in the ED.    Fayrene Helperran, Dyan Creelman, PA-C 12/29/18 1311    Arby BarrettePfeiffer, Marcy, MD 12/30/18 [phone]

## 2018-12-29 NOTE — ED Notes (Signed)
Patient verbalizes understanding of discharge instructions. Opportunity for questioning and answers were provided. Armband removed by staff, pt discharged from ED. Ambulated out to lobby  

## 2018-12-31 ENCOUNTER — Telehealth: Payer: Self-pay

## 2018-12-31 NOTE — Telephone Encounter (Signed)
Pt called stating that he was called today about his COVID-19 test. He states that he wanted to heare it again because he was driving and his wife was talking to the nurse. Pt was told that his COVID-19 test result was positive meaning he had the novel coronavirus and could spread it to others. Pt states that he still has ongoing headache some chills but no fever and a cough. He denies Chest pain and SOB.  Quarantine requirements reviewed with pt and his wife. They both verbalized understanding of all instructions. Wife state that they have multiple family members in the house. She was instructed to social distance as much as possible and report any symptoms to there heath care provider. She verbalized understanding and states that she will clean surfaces frequently.

## 2019-01-01 LAB — NOVEL CORONAVIRUS, NAA (HOSP ORDER, SEND-OUT TO REF LAB; TAT 18-24 HRS): SARS-CoV-2, NAA: DETECTED — AB

## 2019-01-01 NOTE — Telephone Encounter (Signed)
Faxed COVID results to [facility] @ [phone].

## 2019-01-14 ENCOUNTER — Emergency Department (HOSPITAL_COMMUNITY): Payer: No Typology Code available for payment source

## 2019-01-14 ENCOUNTER — Other Ambulatory Visit: Payer: Self-pay

## 2019-01-14 ENCOUNTER — Encounter (HOSPITAL_COMMUNITY): Payer: Self-pay | Admitting: Student

## 2019-01-14 ENCOUNTER — Emergency Department (HOSPITAL_COMMUNITY)
Admission: EM | Admit: 2019-01-14 | Discharge: 2019-01-14 | Disposition: A | Discharge status: Home / Self Care | Payer: No Typology Code available for payment source | Attending: Emergency Medicine | Admitting: Emergency Medicine

## 2019-01-14 DIAGNOSIS — Z87891 Personal history of nicotine dependence: Secondary | ICD-10-CM | POA: Diagnosis not present

## 2019-01-14 DIAGNOSIS — R509 Fever, unspecified: Secondary | ICD-10-CM | POA: Insufficient documentation

## 2019-01-14 DIAGNOSIS — Z0489 Encounter for examination and observation for other specified reasons: Secondary | ICD-10-CM | POA: Diagnosis present

## 2019-01-14 DIAGNOSIS — I4891 Unspecified atrial fibrillation: Secondary | ICD-10-CM | POA: Diagnosis not present

## 2019-01-14 DIAGNOSIS — I5033 Acute on chronic diastolic (congestive) heart failure: Secondary | ICD-10-CM | POA: Diagnosis not present

## 2019-01-14 DIAGNOSIS — U071 COVID-19: Secondary | ICD-10-CM | POA: Diagnosis not present

## 2019-01-14 LAB — BASIC METABOLIC PANEL
Anion gap: 8 (ref 5–15)
BUN: 9 mg/dL (ref 6–20)
CO2: 22 mmol/L (ref 22–32)
Calcium: 9 mg/dL (ref 8.9–10.3)
Chloride: 109 mmol/L (ref 98–111)
Creatinine, Ser: 1.28 mg/dL — ABNORMAL HIGH (ref 0.61–1.24)
GFR calc Af Amer: >60 mL/min (ref >60)
GFR calc non Af Amer: >60 mL/min (ref >60)
Glucose, Bld: 98 mg/dL (ref 70–99)
Potassium: 4.3 mmol/L (ref 3.5–5.1)
Sodium: 139 mmol/L (ref 135–145)

## 2019-01-14 LAB — CBC
HCT: 46.9 % (ref 39.0–52.0)
Hemoglobin: 14.7 g/dL (ref 13.0–17.0)
MCH: 21.4 pg — ABNORMAL LOW (ref 26.0–34.0)
MCHC: 31.3 g/dL (ref 30.0–36.0)
MCV: 68.4 fL — ABNORMAL LOW (ref 80.0–100.0)
Platelets: 348 10*3/uL (ref 150–400)
RBC: 6.86 MIL/uL — ABNORMAL HIGH (ref 4.22–5.81)
RDW: 17.1 % — ABNORMAL HIGH (ref 11.5–15.5)
WBC: 3.6 10*3/uL — ABNORMAL LOW (ref 4.0–10.5)
nRBC: 0 % (ref 0.0–0.2)

## 2019-01-14 LAB — MAGNESIUM: Magnesium: 2.1 mg/dL (ref 1.7–2.4)

## 2019-01-14 MED ORDER — DILTIAZEM HCL 90 MG PO TABS
90.0000 mg | ORAL_TABLET | Freq: Once | ORAL | Status: DC
  Filled 2019-01-14: qty 1

## 2019-01-14 MED ORDER — APIXABAN 5 MG PO TABS: 5.0000 mg | ORAL_TABLET | Freq: Two times a day (BID) | ORAL | 0 refills | Status: DC

## 2019-01-14 MED ORDER — ESOMEPRAZOLE MAGNESIUM 40 MG PO CPDR: 40.0000 mg | DELAYED_RELEASE_CAPSULE | Freq: Every day | ORAL | 0 refills | Status: DC

## 2019-01-14 MED ORDER — DILTIAZEM LOAD VIA INFUSION
15.0000 mg | Freq: Once | INTRAVENOUS | Status: AC
  Administered 2019-01-14: 15 mg via INTRAVENOUS
  Filled 2019-01-14: qty 15

## 2019-01-14 MED ORDER — DILTIAZEM HCL 90 MG PO TABS: 90.0000 mg | ORAL_TABLET | Freq: Two times a day (BID) | ORAL | 0 refills | Status: DC

## 2019-01-14 MED ORDER — DILTIAZEM HCL 90 MG PO TABS
90.0000 mg | ORAL_TABLET | Freq: Once | ORAL | Status: AC
  Administered 2019-01-14: 14:00:00 90 mg via ORAL

## 2019-01-14 MED ORDER — DILTIAZEM LOAD VIA INFUSION
20.0000 mg | Freq: Once | INTRAVENOUS | Status: AC
  Administered 2019-01-14: 13:00:00 20 mg via INTRAVENOUS
  Filled 2019-01-14: qty 20

## 2019-01-14 MED ORDER — DILTIAZEM HCL-DEXTROSE 100-5 MG/100ML-% IV SOLN (PREMIX)
5.0000 mg/h | INTRAVENOUS | Status: DC
  Administered 2019-01-14: 13:00:00 5 mg/h via INTRAVENOUS
  Filled 2019-01-14: qty 100

## 2019-01-14 NOTE — Discharge Instructions (Signed)
You were seen in the emergency department today for COVID testing.  We have tested you for coronavirus, we will call you with results.   While in the emergency department you were noted to be in atrial fibrillation, and abnormal heart rhythm, your heart was going very fast when you first got here, this improved with IV and oral medicines.   We are sending you home with Cardizem and with Eliquis for atrial fibrillation. -Cardizem is a medicine to help slow your heart rate, please take this twice per day, this is the dose you were previously taking. - Eliquis-Eliquis is a blood thinning medication, this helps to prevent clot formation which is something you are at risk for with atrial fibrillation.  Please see the attached  handout for details of this medicine.   WE have also refilled your nexium.   We have prescribed you new medication(s) today. Discuss the medications prescribed today with your pharmacist as they can have adverse effects and interactions with your other medicines including over the counter and prescribed medications. Seek medical evaluation if you start to experience new or abnormal symptoms after taking one of these medicines, seek care immediately if you start to experience difficulty breathing, feeling of your throat closing, facial swelling, or rash as these could be indications of a more serious allergic reaction   Please take all medications as prescribed. He will need to follow-up with the cardiology atrial fibrillation clinic, they will call you to schedule an appointment, you do not hear from them within the next few days please call to schedule yourself.  Return to the emergency department immediately for new or worsening symptoms including but limited to palpitations, chest pain, trouble breathing, lightheadedness, dizziness, passing out, generalized weakness, she has hit her head, blood in your urine, blood in your stool, coughing up blood, any abnormal bruising/bleeding,  or any other concerns.     Information on my medicine - ELIQUIS (apixaban)  Why was Eliquis prescribed for you? Eliquis was prescribed for you to reduce the risk of a blood clot forming that can cause a stroke if you have a medical condition called atrial fibrillation (a type of irregular heartbeat).  What do You need to know about Eliquis ? Take your Eliquis TWICE DAILY - one tablet in the morning and one tablet in the evening with or without food. If you have difficulty swallowing the tablet whole please discuss with your pharmacist how to take the medication safely.  Take Eliquis exactly as prescribed by your doctor and DO NOT stop taking Eliquis without talking to the doctor who prescribed the medication.  Stopping may increase your risk of developing a stroke.  Refill your prescription before you run out.  After discharge, you should have regular check-up appointments with your healthcare provider that is prescribing your Eliquis.  In the future your dose may need to be changed if your kidney function or weight changes by a significant amount or as you get older.  What do you do if you miss a dose? If you miss a dose, take it as soon as you remember on the same day and resume taking twice daily.  Do not take more than one dose of ELIQUIS at the same time to make up a missed dose.  Important Safety Information A possible side effect of Eliquis is bleeding. You should call your healthcare provider right away if you experience any of the following: ? Bleeding from an injury or your nose that does not stop. ?  Unusual colored urine (red or dark brown) or unusual colored stools (red or black). ? Unusual bruising for unknown reasons. ? A serious fall or if you hit your head (even if there is no bleeding).  Some medicines may interact with Eliquis and might increase your risk of bleeding or clotting while on Eliquis. To help avoid this, consult your healthcare provider or  pharmacist prior to using any new prescription or non-prescription medications, including herbals, vitamins, non-steroidal anti-inflammatory drugs (NSAIDs) and supplements.  This website has more information on Eliquis (apixaban): http://www.eliquis.com/eliquis/home

## 2019-01-14 NOTE — ED Triage Notes (Signed)
Pt was positive covid 2 weeks ago-- still feels bad, tachycardic,

## 2019-01-14 NOTE — ED Notes (Signed)
Patient verbalizes understanding of discharge instructions. Opportunity for questioning and answering were provided, patient discharged from ED. 

## 2019-01-14 NOTE — ED Provider Notes (Signed)
[facility] EMERGENCY DEPARTMENT Provider Note   CSN: [id number] Arrival date & time: 01/14/19  1037     History   Chief Complaint Chief Complaint  Patient presents with  . covid sx    HPI [name] [name] is a 53 y.o. male with a hx of prior atrial flutter, diastolic CHF, & prior tobacco use who presents to the ED requesting covid testing today.   Patient seen in the ED 12/29/18 after a covid exposure w/ resultant testing which returned positive. He states he had a short course of sxs including mild cough, diarrhea & headaches which completely resolved within a few days. He has been asymptomatic for over 1 week. He quarantined for 14 days, however now his work will not let him return without a negative covid swab. He presented to the ED today for testing. He states he is completely asymptomatic without complaints/concerns. Denies fever, chills, cough, dyspnea, abdominal pain, or N/V/D. Patient found to be tachycardic in the 150s in triage- he denies dyspnea, chest pain, palpitations, lightheadedness, dizziness, weakness, or syncope, again states he feels fine. States he used to take a heart pill but hasnt in a few years, just stopped taking this without consulting medical provider.   Per chart review: Hospitalization 10/2011 for CAP which was suspected to have triggered afib/flutter, converted back to NSR, per cardiology @ that time no need for anticoagulation, continue diltiazem. Appears he was taking 90mg  BID of Cardizem. Also was found to have diastolic CHF that was compensated, thought to be EtOH related, low sodium diet recommended.       HPI  Past Medical History:  Diagnosis Date  . ARF (acute renal failure) ([place])   . Atrial flutter ([place])   . Community acquired bacterial pneumonia   . Diastolic CHF, chronic ([place])   . Leukocytosis   . N&V (nausea and vomiting)   . SOB (shortness of breath)   . Substance abuse ([place])    Prior hx of cocaine (quit 5 years  ago)/EtOH (clean for 14 months) as of 10/2011    Patient Active Problem List   Diagnosis Date Noted  . Substance abuse ([place])   . Diastolic CHF, chronic   . Diastolic CHF, chronic ([place]) 10/24/2011  . Atrial flutter ([place]) 10/22/2011  . Community acquired bacterial pneumonia 10/21/2011  . SOB (shortness of breath) 10/21/2011  . N&V (nausea and vomiting) 10/21/2011  . ARF (acute renal failure) ([place]) 10/21/2011  . Tobacco abuse 10/21/2011  . Leukocytosis 10/21/2011  . Fever 10/21/2011    History reviewed. No pertinent surgical history.      Home Medications    Prior to Admission medications   Medication Sig Start Date End Date Taking? Authorizing Provider  amoxicillin (AMOXIL) 500 MG capsule Take 1 capsule (500 mg total) by mouth 3 (three) times daily. Patient not taking: Reported on 02/27/2018 08/13/17   Lajean Saver, MD  diltiazem (CARDIZEM) 90 MG tablet Take 1 tablet (90 mg total) by mouth every 12 (twelve) hours. Patient not taking: Reported on 08/13/2017 03/24/13 08/23/14  Dorothy Spark, MD  esomeprazole (NEXIUM) 40 MG capsule Take 1 capsule (40 mg total) by mouth daily. Patient not taking: Reported on 02/27/2018 01/23/17   Fransico Meadow, PA-C  hydrocortisone (ANUSOL-HC) 2.5 % rectal cream Apply rectally 2 times daily 02/27/18   Long, Wonda Olds, MD  ibuprofen (ADVIL,MOTRIN) 600 MG tablet Take 1 tablet (600 mg total) by mouth every 6 (six) hours as needed. Patient not taking: Reported on 08/13/2017  01/15/15   Loren RacerYelverton, David, MD  methocarbamol (ROBAXIN) 500 MG tablet Can take up to 1-2 tabs every 6 hours PRN PAIN Patient not taking: Reported on 08/13/2017 07/28/16   Pisciotta, Joni ReiningNicole, PA-C  naproxen (NAPROSYN) 250 MG tablet Take 250 mg by mouth every 6 (six) hours as needed for mild pain.    [provider]  predniSONE (DELTASONE) 50 MG tablet Take 1 tablet daily with breakfast Patient not taking: Reported on 08/13/2017 12/18/15   Pisciotta, Joni ReiningNicole, PA-C  tobramycin (TOBREX) 0.3 %  ophthalmic solution Place 2 drops into the right eye every 4 (four) hours. Patient not taking: Reported on 08/13/2017 01/23/17   Osie CheeksSofia, Leslie K, PA-C    Family History Family History  Problem Relation Age of Onset  . Heart disease Neg Hx     Social History Social History   Tobacco Use  . Smoking status: Former Games developermoker  . Smokeless tobacco: Never Used  . Tobacco comment: Quit April 2013  Substance Use Topics  . Alcohol use: No    Comment: Previously drank heavily, has been sober for 14 months  . Drug use: No    Comment: As of 10/2011, has been clean from crack cocaine for 5 years. Hx of marijuana.     Allergies   Patient has no known allergies.   Review of Systems Review of Systems  Constitutional: Negative for chills and fever.  HENT: Negative for congestion, ear pain and sore throat.   Respiratory: Negative for cough and shortness of breath.   Cardiovascular: Negative for chest pain.  Gastrointestinal: Negative for abdominal pain, blood in stool, constipation, diarrhea, nausea and vomiting.  Neurological: Negative for dizziness, seizures, syncope, facial asymmetry, weakness, light-headedness, numbness and headaches.  All other systems reviewed and are negative.    Physical Exam Updated Vital Signs BP (!) 129/96   Pulse (!) 157   Temp (S) 99.5 F (37.5 C) (Oral)   Resp 14   SpO2 98%   Physical Exam Vitals signs and nursing note reviewed.  Constitutional:      General: He is not in acute distress.    Appearance: He is well-developed. He is not toxic-appearing.  HENT:     Head: Normocephalic and atraumatic.  Eyes:     General:        Right eye: No discharge.        Left eye: No discharge.     Conjunctiva/sclera: Conjunctivae normal.  Neck:     Musculoskeletal: Neck supple.  Cardiovascular:     Rate and Rhythm: Regular rhythm. Tachycardia present.     Pulses:          Radial pulses are 2+ on the right side and 2+ on the left side.       Dorsalis pedis  pulses are 2+ on the right side and 2+ on the left side.  Pulmonary:     Effort: Pulmonary effort is normal. No respiratory distress.     Breath sounds: Normal breath sounds. No wheezing, rhonchi or rales.  Abdominal:     General: There is no distension.     Palpations: Abdomen is soft.     Tenderness: There is no abdominal tenderness.  Musculoskeletal:        General: No swelling or tenderness.     Right lower leg: No edema.     Left lower leg: No edema.  Skin:    General: Skin is warm and dry.     Findings: No rash.  Neurological:  Mental Status: He is alert.     Comments: Clear speech.   Psychiatric:        Behavior: Behavior normal.      ED Treatments / Results  Labs (all labs ordered are listed, but only abnormal results are displayed) Labs Reviewed  BASIC METABOLIC PANEL - Abnormal; Notable for the following components:      Result Value   Creatinine, Ser 1.28 (*)    All other components within normal limits  CBC - Abnormal; Notable for the following components:   WBC 3.6 (*)    RBC 6.86 (*)    MCV 68.4 (*)    MCH 21.4 (*)    RDW 17.1 (*)    All other components within normal limits  MAGNESIUM    EKG EKG Interpretation  Date/Time:  Tuesday January 14 2019 11:35:08 EDT Ventricular Rate:  159 PR Interval:    QRS Duration: 81 QT Interval:  291 QTC Calculation: 474 R Axis:   33 Text Interpretation:  Atrial flutter with predominant 2:1 AV block Borderline repolarization abnormality Minimal ST elevation, lateral leads Since prior ECG, rhythm appears to be more consistent with atrial flutter Confirmed by Alvira MondaySchlossman, Erin ([id number]) on 01/14/2019 11:46:21 AM   Radiology Dg Chest Port 1 View  Result Date: 01/14/2019 CLINICAL DATA:  Tachycardia. EXAM: PORTABLE CHEST 1 VIEW COMPARISON:  Radiographs of August 13, 2017. FINDINGS: The heart size and mediastinal contours are within normal limits. Both lungs are clear. The visualized skeletal structures are unremarkable.  IMPRESSION: No active disease. Electronically Signed   By: Lupita RaiderJames  Green Jr M.D.   On: 01/14/2019 12:22    Procedures Procedures (including critical care time)  CRITICAL CARE Performed by: Harvie HeckSamantha Donna Snooks   Total critical care time: 45 minutes  Critical care time was exclusive of separately billable procedures and treating other patients.  Critical care was necessary to treat or prevent imminent or life-threatening deterioration.  Critical care was time spent personally by me on the following activities: development of treatment plan with patient and/or surrogate as well as nursing, discussions with consultants, evaluation of patient's response to treatment, examination of patient, obtaining history from patient or surrogate, ordering and performing treatments and interventions, ordering and review of laboratory studies, ordering and review of radiographic studies, pulse oximetry and re-evaluation of patient's condition.  Medications Ordered in ED Medications  diltiazem (CARDIZEM) 1 mg/mL load via infusion 20 mg (has no administration in time range)    And  diltiazem (CARDIZEM) 100 mg in dextrose 5% 100mL (1 mg/mL) infusion (has no administration in time range)    Initial Impression / Assessment and Plan / ED Course  I have reviewed the triage vital signs and the nursing notes.  Pertinent labs & imaging results that were available during my care of the patient were reviewed by me and considered in my medical decision making (see chart for details).   Patient presents to the ED for asymptomatic repeat covid 19 testing at request of his workplace. Had positive swab 12/29/18 with mild sxs which have completely resolved, quarantined appropriately & now here for repeat test. Upon arrival patient noted to be tachycardic in the 150s in triage & placed in acute care bed. Initial EKG appears to be more sinus tachycardia, however repeat EKG seems consistent w/ atrial flutter with 2:1 AV  block--patient has hx of aflutter/fib during hospitalization in 2014 during which he did convert to NSR per chart review was started on diltiazem, did not need anticoagulation per cardiology. States  he stopped taking his "heart pill" a few years ago without consultation to his cardiologist, has not had sxs of afib/flutter since. Currently appears to be in asymptomatic aflutter in the ER- no known onset, stable, not candidate for cardioversion @ this time. Will check basic labs, CXR, & start on cardizem.    CBC: Leukopenia @ 3.6- likely related to recent covid 19. No significant anemia. No thrombocytopenia.  BMP/Mg: Creatinine mildly elevated- similar to last labs on record, no significant electrolyte derangement.  CXR: No active disease. NO signs of CHF.   We discussed option of admission given HR remains 120s, patient adamant that he would like to go home and does not want to be admitted therefore will attempt to control HR in the ED with second bolus.   Following cardizem bolus x 2 w/ drip in place HR improved to 85-105, appears to be in afib w/ improving rate, will give dose of PO cardizem (old dose on record 90 mg BID) & attempt to titrate off drip.   Patient HR remains in the 85-105 off cardizem drip, BP stable, will discuss w/ cardiology.   Discussed w/ Micah FlesherAngela Duke PA-C w/ cardiology who has discussed w/ Dr. Anne FuSkains- It is possible that this was triggered by his recent covid as it was in the hospital in 2014 w/ pneumonia, they are in agreement w/ re-starting prior dose of cardizem and they have recommended we start patient on 5 mg of Eliquis BID with afib clinic follow up which they will arrange, anticoagulation in case need for cardioversion.   CHA2DS2/VAS score: 1 for HF, however patient has prior hx of HTN which he denies score of 1 vs. 2.   Pharmacy consulted for eliquis education & to provide w/ coupon.   Prescriptions for cardizem & eliquis provided, patient also requesting refill of his  nexium which was provided as well.   Afib clinic follow up.  Outpatient covid swab sent at his company's request- asymptomatic, has quarantined appropriately from prior covid positive result.   I discussed results, treatment plan, need for follow-up, and return precautions with the patient. Provided opportunity for questions, patient confirmed understanding and is in agreement with plan.   Findings and plan of care discussed with supervising physician Dr. Sheral FlowSchloss who has provided guidance in care & is in agreement.    Final Clinical Impressions(s) / ED Diagnoses   Final diagnoses:  Atrial fibrillation, unspecified type [facility](HCC)    ED Discharge Orders         Ordered    Amb referral to AFIB Clinic     01/14/19 [address]1145           Kiptyn Rafuse, [place] LakeSamantha R, PA-C 01/14/19 1626    Alvira MondaySchlossman, Erin, MD 01/17/19 1950

## 2019-01-14 NOTE — ED Notes (Signed)
Pt denies feeling worse , pt states " I have no symptoms at all anymore , I feel fine . Im just here to get a repeat covid test so I can go back to work "

## 2019-01-15 ENCOUNTER — Telehealth (HOSPITAL_COMMUNITY): Payer: Self-pay

## 2019-01-16 LAB — NOVEL CORONAVIRUS, NAA (HOSP ORDER, SEND-OUT TO REF LAB; TAT 18-24 HRS): SARS-CoV-2, NAA: DETECTED — AB

## 2019-01-24 ENCOUNTER — Emergency Department (HOSPITAL_COMMUNITY)
Admission: EM | Admit: 2019-01-24 | Discharge: 2019-01-24 | Disposition: A | Discharge status: Home / Self Care | Payer: No Typology Code available for payment source | Attending: Emergency Medicine | Admitting: Emergency Medicine

## 2019-01-24 ENCOUNTER — Encounter (HOSPITAL_COMMUNITY): Payer: Self-pay

## 2019-01-24 ENCOUNTER — Other Ambulatory Visit: Payer: Self-pay

## 2019-01-24 DIAGNOSIS — U071 COVID-19: Secondary | ICD-10-CM | POA: Diagnosis not present

## 2019-01-24 DIAGNOSIS — I5032 Chronic diastolic (congestive) heart failure: Secondary | ICD-10-CM | POA: Diagnosis not present

## 2019-01-24 DIAGNOSIS — Z7901 Long term (current) use of anticoagulants: Secondary | ICD-10-CM | POA: Diagnosis not present

## 2019-01-24 DIAGNOSIS — Z87891 Personal history of nicotine dependence: Secondary | ICD-10-CM | POA: Insufficient documentation

## 2019-01-24 DIAGNOSIS — Z20822 Contact with and (suspected) exposure to covid-19: Secondary | ICD-10-CM

## 2019-01-24 DIAGNOSIS — Z20828 Contact with and (suspected) exposure to other viral communicable diseases: Secondary | ICD-10-CM | POA: Diagnosis present

## 2019-01-24 NOTE — ED Triage Notes (Signed)
covid recheck , can't return to work until negative results.  Was told to return today for retest.

## 2019-01-24 NOTE — ED Provider Notes (Signed)
[facility] EMERGENCY DEPARTMENT Provider Note   CSN: [id number] Arrival date & time: 01/24/19  3825     History   Chief Complaint Chief Complaint  Patient presents with   Follow-up    HPI [name] [name] is a 53 y.o. male with past medical history of atrial flutter, diastolic CHF and tobacco use presenting to emergency department today with chief complaint of covid retest.  Patient tested positive for COVID on 12/29/18.  Since testing positive he has self quarantined.  He said he has been asymptomatic.  He denies fever, cough, chest pain, shortness of breath, palpitations, abdominal pain, nausea, vomiting, diarrhea, headache.  He is hoping to return to work however must have a negative COVID test before being able to return.  Chart review shows pt was seen in ED on 01/14/19 for retest and found to be still positive. At that visit he was found to be tachycardic and in a fib. HR improved with Cardizem drip and cardiology was consulted. He was discharged home with prescriptions for eliquis and Cardizem. He reports taking them as prescribed and has not missed any doses. He has an appointment scheduled next week with Afib clinic. Past Medical History:  Diagnosis Date   ARF (acute renal failure) ([facility])    Atrial flutter ([place])    Community acquired bacterial pneumonia    Diastolic CHF, chronic ([facility])    Leukocytosis    N&V (nausea and vomiting)    SOB (shortness of breath)    Substance abuse ([place])    Prior hx of cocaine (quit 5 years ago)/EtOH (clean for 14 months) as of 10/2011    Patient Active Problem List   Diagnosis Date Noted   Substance abuse ([place])    Diastolic CHF, chronic    Diastolic CHF, chronic ([place]) 10/24/2011   Atrial flutter ([place]) 10/22/2011   Community acquired bacterial pneumonia 10/21/2011   SOB (shortness of breath) 10/21/2011   N&V (nausea and vomiting) 10/21/2011   ARF (acute renal failure) ([place]) 10/21/2011   Tobacco abuse  10/21/2011   Leukocytosis 10/21/2011   Fever 10/21/2011    History reviewed. No pertinent surgical history.      Home Medications    Prior to Admission medications   Medication Sig Start Date End Date Taking? Authorizing Provider  apixaban (ELIQUIS) 5 MG TABS tablet Take 1 tablet (5 mg total) by mouth 2 (two) times daily. 01/14/19 02/13/19  Petrucelli, Samantha R, PA-C  diltiazem (CARDIZEM) 90 MG tablet Take 1 tablet (90 mg total) by mouth every 12 (twelve) hours. 01/14/19 02/13/19  Petrucelli, Glynda Jaeger, PA-C  esomeprazole (NEXIUM) 40 MG capsule Take 1 capsule (40 mg total) by mouth daily. 01/14/19   Petrucelli, Glynda Jaeger, PA-C    Family History Family History  Problem Relation Age of Onset   Heart disease Neg Hx     Social History Social History   Tobacco Use   Smoking status: Former Smoker   Smokeless tobacco: Never Used   Tobacco comment: Quit April 2013  Substance Use Topics   Alcohol use: No    Comment: Previously drank heavily, has been sober for 14 months   Drug use: No    Comment: As of 10/2011, has been clean from crack cocaine for 5 years. Hx of marijuana.     Allergies   Patient has no known allergies.   Review of Systems Review of Systems  All other systems reviewed and are negative.    Physical Exam Updated Vital Signs BP 124/72 (  BP Location: Right Arm)    Pulse 69    Temp 98.4 F (36.9 C) (Oral)    Resp 16    Ht 5\' 10"  (1.778 m)    Wt 107.5 kg    SpO2 99%    BMI 34.01 kg/m   Physical Exam Vitals signs and nursing note reviewed.  Constitutional:      General: He is not in acute distress.    Appearance: He is not ill-appearing.  HENT:     Head: Normocephalic and atraumatic.     Right Ear: Tympanic membrane and external ear normal.     Left Ear: Tympanic membrane and external ear normal.     Nose: Nose normal.     Mouth/Throat:     Mouth: Mucous membranes are moist.     Pharynx: Oropharynx is clear.  Eyes:     General: No scleral  icterus.       Right eye: No discharge.        Left eye: No discharge.     Extraocular Movements: Extraocular movements intact.     Conjunctiva/sclera: Conjunctivae normal.     Pupils: Pupils are equal, round, and reactive to light.  Neck:     Musculoskeletal: Normal range of motion.     Vascular: No JVD.  Cardiovascular:     Rate and Rhythm: Normal rate and regular rhythm.     Pulses: Normal pulses.          Radial pulses are 2+ on the right side and 2+ on the left side.     Heart sounds: Normal heart sounds.  Pulmonary:     Comments: Lungs clear to auscultation in all fields. Symmetric chest rise. No wheezing, rales, or rhonchi. Abdominal:     Comments: Abdomen is soft, non-distended, and non-tender in all quadrants. No rigidity, no guarding. No peritoneal signs.  Musculoskeletal: Normal range of motion.  Skin:    General: Skin is warm and dry.     Capillary Refill: Capillary refill takes less than 2 seconds.  Neurological:     Mental Status: He is oriented to person, place, and time.     GCS: GCS eye subscore is 4. GCS verbal subscore is 5. GCS motor subscore is 6.     Comments: Fluent speech, no facial droop.  Psychiatric:        Behavior: Behavior normal.      ED Treatments / Results  Labs (all labs ordered are listed, but only abnormal results are displayed) Labs Reviewed  NOVEL CORONAVIRUS, NAA (HOSPITAL ORDER, SEND-OUT TO REF LAB)    EKG None  Radiology No results found.  Procedures Procedures (including critical care time)  Medications Ordered in ED Medications - No data to display   Initial Impression / Assessment and Plan / ED Course  I have reviewed the triage vital signs and the nursing notes.  Pertinent labs & imaging results that were available during my care of the patient were reviewed by me and considered in my medical decision making (see chart for details).  Pt is very well appearing, in no acute distress. Vitals are within normal range. He  is on cardiac monitor showing sinus rhythm. I reviewed monitor multiple times while pt in ED and there was no afib. His lungs are clear to auscultation in all fields.  Send out covid test performed and pt instructed to continue to self quarantine until he has results. He has follow up scheduled with afib clinic next week . His exam  and vitals are very reassuring, pt is stable to discharge.  Patient is hemodynamically stable, in NAD, and able to ambulate in the ED. Evaluation does not show pathology that would require ongoing emergent intervention or inpatient treatment. I explained the diagnosis to the patient.  Patient is comfortable with above plan.  All questions were answered prior to disposition.  Strict return precautions for returning to the ED were discussed. Encouraged follow up with PCP as well.  [name] [name] was evaluated in Emergency Department on 01/25/2019 for the symptoms described in the history of present illness. He was evaluated in the context of the global COVID-19 pandemic, which necessitated consideration that the patient might be at risk for infection with the SARS-CoV-2 virus that causes COVID-19. Institutional protocols and algorithms that pertain to the evaluation of patients at risk for COVID-19 are in a state of rapid change based on information released by regulatory bodies including the CDC and federal and state organizations. These policies and algorithms were followed during the patient's care in the ED.  This note was prepared using Dragon voice recognition software and may include unintentional dictation errors due to the inherent limitations of voice recognition software.    Final Clinical Impressions(s) / ED Diagnoses   Final diagnoses:  Suspected Covid-19 Virus Infection    ED Discharge Orders    None       Sherene Sireslbrizze, [place] Leggitt E, PA-C 01/25/19 0900    Sabas SousBero, Michael M, MD 01/26/19 [phone]

## 2019-01-24 NOTE — Discharge Instructions (Signed)
You have been seen today for COVID retest. Please read and follow all provided instructions. Return to the emergency room for worsening condition or new concerning symptoms.    You were tested today for COVID.  The results should be back in 2 to 5 days, please continue to self quarantine until the results are back.   1. Medications:  Continue usual home medications.  2. Treatment: rest, drink plenty of fluids 3. Follow Up: Please follow up with your primary doctor in 2-5 days for discussion of your diagnoses and further evaluation after today's visit; Call today to arrange your follow up.  -Also recommend you keep your follow-up cardiology appointment as scheduled  It is also a possibility that you have an allergic reaction to any of the medicines that you have been prescribed - Everybody reacts differently to medications and while MOST people have no trouble with most medicines, you may have a reaction such as nausea, vomiting, rash, swelling, shortness of breath. If this is the case, please stop taking the medicine immediately and contact your physician.  ?

## 2019-01-24 NOTE — ED Triage Notes (Signed)
Pt states he is trying to go back to work, wanting a negative covid test result to go back. Denies any symptoms of covid at present, states he has had 2 positives so far.

## 2019-01-26 LAB — NOVEL CORONAVIRUS, NAA (HOSP ORDER, SEND-OUT TO REF LAB; TAT 18-24 HRS): SARS-CoV-2, NAA: DETECTED — AB

## 2019-01-28 ENCOUNTER — Ambulatory Visit: Payer: Self-pay

## 2019-01-28 NOTE — Telephone Encounter (Signed)
Pt stated he has been tested positive for Covid-19 three different times most recently on 01/24/19. He would like to speak with a nurse about anything he can do while at home. He wants to know if he should retest in 10 days or 14 days. Please advise. No PCP. [phone]   Advised pt to get retested 02/04/19 and discussed things to do at home to manage symptoms at home. Assisted pt with signing up to  [facility].   Reason for Disposition . General information question, no triage required and triager able to answer question  Answer Assessment - Initial Assessment Questions 1. REASON FOR CALL or QUESTION: "What is your reason for calling today?" or "How can I best help you?" or "What question do you have that I can help answer?"  Pt stated he has been tested positive for Covid-19 three different times most recently on 01/24/19. He would like to speak with a nurse about anything he can do while at home. He wants to know if he should retest in 10 days or 14 days. Please advise. No PCP. CB#[phone]  Protocols used: INFORMATION ONLY CALL - NO TRIAGE-A-AH

## 2019-01-29 ENCOUNTER — Other Ambulatory Visit: Payer: Self-pay

## 2019-01-29 ENCOUNTER — Ambulatory Visit (HOSPITAL_COMMUNITY)
Admission: RE | Admit: 2019-01-29 | Discharge: 2019-01-29 | Disposition: A | Discharge status: Home / Self Care | Payer: PRIVATE HEALTH INSURANCE | Source: Ambulatory Visit | Attending: Physician Assistant | Admitting: Physician Assistant

## 2019-01-29 DIAGNOSIS — I480 Paroxysmal atrial fibrillation: Secondary | ICD-10-CM

## 2019-01-29 DIAGNOSIS — G4733 Obstructive sleep apnea (adult) (pediatric): Secondary | ICD-10-CM | POA: Diagnosis not present

## 2019-01-29 DIAGNOSIS — U071 COVID-19: Secondary | ICD-10-CM

## 2019-01-29 NOTE — Progress Notes (Signed)
Electrophysiology TeleHealth Note   Due to national recommendations of social distancing due to COVID 19, Audio telehealth visit is felt to be most appropriate for this patient at this time.  See consent below from today for patient consent regarding telehealth for the [facility]. Consent obtained verbally.   Date:  01/29/2019   ID:  [name] [name], DOB 01/17/1966, MRN [id number]  Location: home  Provider location: [address] [place], [place] [address] Evaluation Performed: New patient consult  PCP:  Patient, No Pcp Per  Primary Cardiologist:  Dr Delton SeeNelson Primary Electrophysiologist: none   CC: Consultation for atrial fibrillation.   History of Present Illness: [name] [name] is a 53 y.o. male who presents via audio/video conferencing for a telehealth visit today.   The patient is referred for new consultation regarding atrial fibrillation and atrial flutter [facility]. Patient has a history of paroxysmal afib, atrial flutter, diastolic CHF and tobacco use. He was initially diagnosed with afib in 2013 in the setting of CAP. He spontaneously converted back to SR. Unfortunately, patient tested positive for COVID-19 and was found to be in atrial flutter and later in afib at the ER. He converted to SR after diltiazem drip. He was asymptomatic during the episode. He reports that he feels "great" with no symptoms of heart racing or COVID-19 symptoms. He does admit that he snores loudly and has witnessed apneic episodes by his sleep partner and daytime somnolence. He denies any recent alcohol use.  Today, he denies symptoms of palpitations, chest pain, shortness of breath, orthopnea, PND, lower extremity edema, claudication, dizziness, presyncope, syncope, bleeding, or neurologic sequela. The patient is tolerating medications without difficulties and is otherwise without complaint today.     Atrial Fibrillation Risk Factors:  he does have symptoms of sleep apnea.  Will plan to arrange sleep study once he is negative for COVID. he does have a history of alcohol use. Does not drink currently.   he has a BMI of There is no height or weight on file to calculate BMI.. There were no vitals filed for this visit.  Pt does not have BP machine at home.  Past Medical History:  Diagnosis Date  . ARF (acute renal failure) (HCC)   . Atrial flutter (HCC)   . Community acquired bacterial pneumonia   . Diastolic CHF, chronic (HCC)   . Leukocytosis   . N&V (nausea and vomiting)   . SOB (shortness of breath)   . Substance abuse (HCC)    Prior hx of cocaine (quit 5 years ago)/EtOH (clean for 14 months) as of 10/2011   No past surgical history on file.   Current Outpatient Medications  Medication Sig Dispense Refill  . apixaban (ELIQUIS) 5 MG TABS tablet Take 1 tablet (5 mg total) by mouth 2 (two) times daily. 60 tablet 0  . diltiazem (CARDIZEM) 90 MG tablet Take 1 tablet (90 mg total) by mouth every 12 (twelve) hours. 60 tablet 0  . esomeprazole (NEXIUM) 40 MG capsule Take 1 capsule (40 mg total) by mouth daily. 30 capsule 0   No current facility-administered medications for this encounter.     Allergies:   Patient has no known allergies.   Social History:  The patient  reports that he has quit smoking. He has never used smokeless tobacco. He reports that he does not drink alcohol or use drugs.   Family History:  The patient's family history is not on file.    ROS:  Please see the history of present illness.   All other systems are personally reviewed and negative.    Recent Labs: 01/14/2019: BUN 9; Creatinine, Ser 1.28; Hemoglobin 14.7; Magnesium 2.1; Platelets 348; Potassium 4.3; Sodium 139  personally reviewed    Other studies personally reviewed: Additional studies/ records that were reviewed today include: Epic notes, echocardiogram   Echo 10/23/11  Left ventricle: The cavity size was normal. Wall thickness  was normal. Systolic function was  normal. The estimated  ejection fraction was in the range of 55% to 60%. Features  are consistent with a pseudonormal left ventricular filling  pattern, with concomitant abnormal relaxation and increased  filling pressure (grade 2 diastolic dysfunction).  LA normal in size.   ASSESSMENT AND PLAN:  1. Paroxysmal atrial fibrillation/atrial flutter Patient has a history of afib and atrial flutter in setting of CAP in 2013 and now in setting of COVID-19.  Patient currently asymptomatic.  Will plan to have pt come into office once he is negative for COVID-19 for ECG to confirm he is maintaining SR. Continue Eliquis 5 mg BID for now in case DCCV is needed. Will stop if he is in SR given low CHADSVASC score. Continue diltiazem 90 mg BID.   This patients CHA2DS2-VASc Score and unadjusted Ischemic Stroke Rate (% per year) is equal to 0.2 % stroke rate/year from a score of 0  Above score calculated as 1 point each if present [CHF, HTN, DM, Vascular=MI/PAD/Aortic Plaque, Age if 65-74, or Male] Above score calculated as 2 points each if present [Age > 75, or Stroke/TIA/TE]  2. COVID-19 Plans for retesting 02/04/19 per patient. He is currently asymptomatic.   3. Snoring/witnessed apnea/daytie somnolence Patient having symptoms of sleep apnea. Will arrange for sleep study once he is cleared of COVID-19.   Follow-up will be arranged once he is negative for COVID-19.   Current medicines are reviewed at length with the patient today.   The patient does not have concerns regarding his medicines.  The following changes were made today:  none  Labs/ tests ordered today include:  No orders of the defined types were placed in this encounter.   Patient Risk:  after full review of this patients clinical status, I feel that they are at moderate risk at this time.   Today, I have spent 19 minutes with the patient with telehealth technology discussing atrial fibrillation/flutter, DCCV, and COVID-19.     Gwenlyn Perking PA-C 01/29/2019 9:48 AM  Afib [facility] [place], [place] [id number] [phone]   I hereby voluntarily request, consent and authorize the [facility] and its employed or contracted physicians, physician assistants, nurse practitioners or other licensed health care professionals (the Practitioner), to provide me with telemedicine health care services (the "Services") as deemed necessary by the treating Practitioner. I acknowledge and consent to receive the Services by the Practitioner via telemedicine. I understand that the telemedicine visit will involve communicating with the Practitioner through live audiovisual communication technology and the disclosure of certain medical information by electronic transmission. I acknowledge that I have been given the opportunity to request an in-person assessment or other available alternative prior to the telemedicine visit and am voluntarily participating in the telemedicine visit.   I understand that I have the right to withhold or withdraw my consent to the use of telemedicine in the course of my care at any time, without affecting my right to future care or treatment, and that the Practitioner or  I may terminate the telemedicine visit at any time. I understand that I have the right to inspect all information obtained and/or recorded in the course of the telemedicine visit and may receive copies of available information for a reasonable fee.  I understand that some of the potential risks of receiving the Services via telemedicine include:   Delay or interruption in medical evaluation due to technological equipment failure or disruption;  Information transmitted may not be sufficient (e.g. poor resolution of images) to allow for appropriate medical decision making by the Practitioner; and/or  In rare instances, security protocols could fail, causing a breach of personal health  information.   Furthermore, I acknowledge that it is my responsibility to provide information about my medical history, conditions and care that is complete and accurate to the best of my ability. I acknowledge that Practitioner's advice, recommendations, and/or decision may be based on factors not within their control, such as incomplete or inaccurate data provided by me or distortions of diagnostic images or specimens that may result from electronic transmissions. I understand that the practice of medicine is not an exact science and that Practitioner makes no warranties or guarantees regarding treatment outcomes. I acknowledge that I will receive a copy of this consent concurrently upon execution via email to the email address I last provided but may also request a printed copy by calling the office of the Atrial Fibrillation Clinic.  I understand that my insurance will be billed for this visit.   I have read or had this consent read to me.  I understand the contents of this consent, which adequately explains the benefits and risks of the Services being provided via telemedicine.  I have been provided ample opportunity to ask questions regarding this consent and the Services and have had my questions answered to my satisfaction.  I give my informed consent for the services to be provided through the use of telemedicine in my medical care  By participating in this telemedicine visit I agree to the above.

## 2019-02-04 ENCOUNTER — Encounter (HOSPITAL_COMMUNITY): Payer: Self-pay | Admitting: Emergency Medicine

## 2019-02-04 ENCOUNTER — Emergency Department (HOSPITAL_COMMUNITY)
Admission: EM | Admit: 2019-02-04 | Discharge: 2019-02-04 | Disposition: A | Discharge status: Home / Self Care | Payer: No Typology Code available for payment source | Attending: Emergency Medicine | Admitting: Emergency Medicine

## 2019-02-04 DIAGNOSIS — Z09 Encounter for follow-up examination after completed treatment for conditions other than malignant neoplasm: Secondary | ICD-10-CM | POA: Diagnosis present

## 2019-02-04 DIAGNOSIS — Z8709 Personal history of other diseases of the respiratory system: Secondary | ICD-10-CM | POA: Insufficient documentation

## 2019-02-04 DIAGNOSIS — U071 COVID-19: Secondary | ICD-10-CM | POA: Diagnosis not present

## 2019-02-04 NOTE — ED Provider Notes (Signed)
[facility] EMERGENCY DEPARTMENT Provider Note   CSN: [id number] Arrival date & time: 02/04/19  1032    History   Chief Complaint Chief Complaint  Patient presents with  . covid test    HPI [name] [name] is a 54 y.o. male.     HPI   53 year old male presents today for Cobra testing.  Patient notes that back at the end of June he had close contact with somebody with COVID-19, he did develop very minor symptoms for approximately 3 to 4 days.  He tested COVID at that time.  He has had subsequent positive Cobra testing here on the seventh, again on the 17th.  Patient reports he has been asymptomatic since that time.  He reports he needs a negative code test to return back to work.  Past Medical History:  Diagnosis Date  . ARF (acute renal failure) ([place])   . Atrial flutter ([place])   . Community acquired bacterial pneumonia   . Diastolic CHF, chronic ([place])   . Leukocytosis   . N&V (nausea and vomiting)   . SOB (shortness of breath)   . Substance abuse ([place])    Prior hx of cocaine (quit 5 years ago)/EtOH (clean for 14 months) as of 10/2011    Patient Active Problem List   Diagnosis Date Noted  . Substance abuse ([place])   . Diastolic CHF, chronic   . Diastolic CHF, chronic ([place]) 10/24/2011  . Atrial flutter ([place]) 10/22/2011  . Community acquired bacterial pneumonia 10/21/2011  . SOB (shortness of breath) 10/21/2011  . N&V (nausea and vomiting) 10/21/2011  . ARF (acute renal failure) ([place]) 10/21/2011  . Tobacco abuse 10/21/2011  . Leukocytosis 10/21/2011  . Fever 10/21/2011    History reviewed. No pertinent surgical history.      Home Medications    Prior to Admission medications   Medication Sig Start Date End Date Taking? Authorizing Provider  apixaban (ELIQUIS) 5 MG TABS tablet Take 1 tablet (5 mg total) by mouth 2 (two) times daily. 01/14/19 02/13/19  Petrucelli, Samantha R, PA-C  diltiazem (CARDIZEM) 90 MG tablet Take 1 tablet (90 mg total) by  mouth every 12 (twelve) hours. 01/14/19 02/13/19  Petrucelli, Glynda Jaeger, PA-C  esomeprazole (NEXIUM) 40 MG capsule Take 1 capsule (40 mg total) by mouth daily. 01/14/19   Petrucelli, Glynda Jaeger, PA-C    Family History Family History  Problem Relation Age of Onset  . Heart disease Neg Hx     Social History Social History   Tobacco Use  . Smoking status: Former Research scientist (life sciences)  . Smokeless tobacco: Never Used  . Tobacco comment: Quit April 2013  Substance Use Topics  . Alcohol use: No    Comment: Previously drank heavily, has been sober for 14 months  . Drug use: No    Comment: As of 10/2011, has been clean from crack cocaine for 5 years. Hx of marijuana.     Allergies   Patient has no known allergies.   Review of Systems Review of Systems  All other systems reviewed and are negative.    Physical Exam Updated Vital Signs BP 127/70 (BP Location: Right Arm)   Pulse 77   Resp 16   SpO2 98%   Physical Exam Vitals signs and nursing note reviewed.  Constitutional:      Appearance: He is well-developed.  HENT:     Head: Normocephalic and atraumatic.  Eyes:     General: No scleral icterus.       Right  eye: No discharge.        Left eye: No discharge.     Conjunctiva/sclera: Conjunctivae normal.     Pupils: Pupils are equal, round, and reactive to light.  Neck:     Musculoskeletal: Normal range of motion.     Vascular: No JVD.     Trachea: No tracheal deviation.  Pulmonary:     Effort: Pulmonary effort is normal.     Breath sounds: No stridor.  Neurological:     Mental Status: He is alert and oriented to person, place, and time.     Coordination: Coordination normal.  Psychiatric:        Behavior: Behavior normal.        Thought Content: Thought content normal.        Judgment: Judgment normal.      ED Treatments / Results  Labs (all labs ordered are listed, but only abnormal results are displayed) Labs Reviewed - No data to display  EKG None  Radiology No  results found.  Procedures Procedures (including critical care time)  Medications Ordered in ED Medications - No data to display   Initial Impression / Assessment and Plan / ED Course  I have reviewed the triage vital signs and the nursing notes.  Pertinent labs & imaging results that were available during my care of the patient were reviewed by me and considered in my medical decision making (see chart for details).          53 year old male presents today for COVID testing.  He is asymptomatic and has been for several weeks.  He has several positive tests.  I discussed with him the need for outpatient management for Cobra testing and that we would no longer provide resources here for asymptomatic individuals.  He can follow-up as an outpatient with the health department, he can discuss returning to work with his workplace as he has been asymptomatic.  He verbalized understanding and agreement to today's plan had no further questions or concerns.  [name] [name] was evaluated in Emergency Department on 02/04/2019 for the symptoms described in the history of present illness. He was evaluated in the context of the global COVID-19 pandemic, which necessitated consideration that the patient might be at risk for infection with the SARS-CoV-2 virus that causes COVID-19. Institutional protocols and algorithms that pertain to the evaluation of patients at risk for COVID-19 are in a state of rapid change based on information released by regulatory bodies including the CDC and federal and state organizations. These policies and algorithms were followed during the patient's care in the ED.   Final Clinical Impressions(s) / ED Diagnoses   Final diagnoses:  COVID-19    ED Discharge Orders    None       Rosalio LoudHedges, Skye Plamondon, PA-C 02/04/19 1155    Geoffery Lyonselo, [place], MD 02/05/19 [phone]

## 2019-02-04 NOTE — ED Triage Notes (Signed)
Pt here for covid retest in order to go to work. Pt states his first positive test was June 21st. Pt has no sxs but work will not let him back un til he has -test.

## 2019-02-04 NOTE — Discharge Instructions (Addendum)
As we discussed you are asymptomatic and have been for prolonged period of time.  You need to discuss with your workplace about returning to work.  Please follow-up with the [facility] for further testing needs.

## 2019-02-12 ENCOUNTER — Other Ambulatory Visit: Payer: Self-pay

## 2019-02-12 DIAGNOSIS — Z20822 Contact with and (suspected) exposure to covid-19: Secondary | ICD-10-CM

## 2019-02-13 LAB — NOVEL CORONAVIRUS, NAA: SARS-CoV-2, NAA: NOT DETECTED

## 2019-02-14 ENCOUNTER — Telehealth: Payer: Self-pay

## 2019-02-14 ENCOUNTER — Other Ambulatory Visit (HOSPITAL_COMMUNITY): Payer: Self-pay | Admitting: *Deleted

## 2019-02-14 MED ORDER — APIXABAN 5 MG PO TABS: 5.0000 mg | ORAL_TABLET | Freq: Two times a day (BID) | ORAL | 0 refills | Status: DC

## 2019-02-14 MED ORDER — DILTIAZEM HCL 90 MG PO TABS: 90.0000 mg | ORAL_TABLET | Freq: Two times a day (BID) | ORAL | 0 refills | Status: DC

## 2019-02-14 NOTE — Telephone Encounter (Signed)
Per patient request, mailed copy of COVID results to home address.

## 2019-02-20 ENCOUNTER — Telehealth (HOSPITAL_COMMUNITY): Payer: Self-pay | Admitting: *Deleted

## 2019-02-20 NOTE — Telephone Encounter (Signed)
Pt cld about Eliquis, at the pharm and medication is tier 2 on ins so he wld have to pay 100% OOP.  Pt was wanting to discuss alternatives.  I left some samples at the front desk for pt to pickup until he could keep in person appt with PA to discuss.  Pt aware

## 2019-02-27 ENCOUNTER — Other Ambulatory Visit (HOSPITAL_COMMUNITY): Payer: Self-pay | Admitting: *Deleted

## 2019-02-27 ENCOUNTER — Other Ambulatory Visit: Payer: Self-pay

## 2019-02-27 ENCOUNTER — Encounter (HOSPITAL_COMMUNITY): Payer: Self-pay | Admitting: Physician Assistant

## 2019-02-27 ENCOUNTER — Ambulatory Visit (HOSPITAL_COMMUNITY)
Admission: RE | Admit: 2019-02-27 | Discharge: 2019-02-27 | Disposition: A | Discharge status: Home / Self Care | Payer: No Typology Code available for payment source | Source: Ambulatory Visit | Attending: Physician Assistant | Admitting: Physician Assistant

## 2019-02-27 VITALS — BP 132/62 | HR 78 | Ht 70.0 in | Wt 231.8 lb

## 2019-02-27 DIAGNOSIS — I5032 Chronic diastolic (congestive) heart failure: Secondary | ICD-10-CM | POA: Insufficient documentation

## 2019-02-27 DIAGNOSIS — Z5181 Encounter for therapeutic drug level monitoring: Secondary | ICD-10-CM | POA: Diagnosis not present

## 2019-02-27 DIAGNOSIS — R0602 Shortness of breath: Secondary | ICD-10-CM | POA: Insufficient documentation

## 2019-02-27 DIAGNOSIS — I480 Paroxysmal atrial fibrillation: Secondary | ICD-10-CM | POA: Diagnosis not present

## 2019-02-27 DIAGNOSIS — R0683 Snoring: Secondary | ICD-10-CM | POA: Diagnosis not present

## 2019-02-27 DIAGNOSIS — Z79899 Other long term (current) drug therapy: Secondary | ICD-10-CM | POA: Insufficient documentation

## 2019-02-27 DIAGNOSIS — I4892 Unspecified atrial flutter: Secondary | ICD-10-CM | POA: Insufficient documentation

## 2019-02-27 DIAGNOSIS — R400 Somnolence: Secondary | ICD-10-CM | POA: Insufficient documentation

## 2019-02-27 NOTE — Progress Notes (Signed)
Primary Care Physician: Patient, No Pcp Per Primary Cardiologist: Dr Meda Coffee Primary Electrophysiologist: none Referring Physician: [facility]   [name] [name] is a 53 y.o. male with a history of paroxysmal afib, atrial flutter, diastolic CHF and tobacco use. He was initially diagnosed with afib in 2013 in the setting of CAP. He spontaneously converted back to SR. Unfortunately, patient tested positive for COVID-19 and was found to be in atrial flutter and later in afib at the ER. He converted to SR after diltiazem drip. He was asymptomatic during the episode. He does admit that he snores loudly and has witnessed apneic episodes by his sleep partner and daytime somnolence. He denies any alcohol use.   On follow up today, patient reports that he has done very well. He finally tested negative for COVID-19 and is back to work. He has not had any heart racing or palpitations. He is in SR today.  Today, he denies symptoms of palpitations, chest pain, shortness of breath, orthopnea, PND, lower extremity edema, dizziness, presyncope, syncope, snoring, daytime somnolence, bleeding, or neurologic sequela. The patient is tolerating medications without difficulties and is otherwise without complaint today.    Atrial Fibrillation Risk Factors:  he does have symptoms or diagnosis of sleep apnea. Will arrange for sleep study. he does have a history of alcohol use. Does not drink currently.   he has a BMI of Body mass index is 33.26 kg/m.Marland Kitchen Filed Weights   02/27/19 1003  Weight: 105.1 kg    Family History  Problem Relation Age of Onset  . Heart disease Neg Hx      Atrial Fibrillation Management history:  Previous antiarrhythmic drugs: none Previous cardioversions: none Previous ablations: none CHADS2VASC score: 0 Anticoagulation history: Eliquis   Past Medical History:  Diagnosis Date  . ARF (acute renal failure) ([place])   . Atrial flutter ([place])   . Community acquired bacterial  pneumonia   . Diastolic CHF, chronic ([place])   . Leukocytosis   . N&V (nausea and vomiting)   . SOB (shortness of breath)   . Substance abuse ([place])    Prior hx of cocaine (quit 5 years ago)/EtOH (clean for 14 months) as of 10/2011   No past surgical history on file.  Current Outpatient Medications  Medication Sig Dispense Refill  . diltiazem (CARDIZEM) 90 MG tablet Take 1 tablet (90 mg total) by mouth every 12 (twelve) hours. 60 tablet 0  . esomeprazole (NEXIUM) 40 MG capsule Take 1 capsule (40 mg total) by mouth daily. 30 capsule 0   No current facility-administered medications for this encounter.     No Known Allergies  Social History   Socioeconomic History  . Marital status: Single    Spouse name: Not on file  . Number of children: Not on file  . Years of education: Not on file  . Highest education level: Not on file  Occupational History  . Not on file  Social Needs  . Financial resource strain: Not on file  . Food insecurity    Worry: Not on file    Inability: Not on file  . Transportation needs    Medical: Not on file    Non-medical: Not on file  Tobacco Use  . Smoking status: Former Research scientist (life sciences)  . Smokeless tobacco: Never Used  . Tobacco comment: Quit April 2013  Substance and Sexual Activity  . Alcohol use: No    Comment: Previously drank heavily, has been sober for 14 months  . Drug use: No  Comment: As of 10/2011, has been clean from crack cocaine for 5 years. Hx of marijuana.  . Sexual activity: Not on file  Lifestyle  . Physical activity    Days per week: Not on file    Minutes per session: Not on file  . Stress: Not on file  Relationships  . Social Musicianconnections    Talks on phone: Not on file    Gets together: Not on file    Attends religious service: Not on file    Active member of club or organization: Not on file    Attends meetings of clubs or organizations: Not on file    Relationship status: Not on file  . Intimate partner violence    Fear of  current or ex partner: Not on file    Emotionally abused: Not on file    Physically abused: Not on file    Forced sexual activity: Not on file  Other Topics Concern  . Not on file  Social History Narrative  . Not on file     ROS- All systems are reviewed and negative except as per the HPI above.  Physical Exam: Vitals:   02/27/19 1003  BP: 132/62  Pulse: 78  Weight: 105.1 kg  Height: 5\' 10"  (1.778 m)    GEN- The patient is well appearing obese male, alert and oriented x 3 today.   Head- normocephalic, atraumatic Eyes-  Sclera clear, conjunctiva pink Ears- hearing intact Oropharynx- clear Neck- supple  Lungs- Clear to ausculation bilaterally, normal work of breathing Heart- Regular rate and rhythm, no murmurs, rubs or gallops  GI- soft, NT, ND, + BS Extremities- no clubbing, cyanosis, or edema MS- no significant deformity or atrophy Skin- no rash or lesion Psych- euthymic mood, full affect Neuro- strength and sensation are intact  Wt Readings from Last 3 Encounters:  02/27/19 105.1 kg  01/24/19 107.5 kg  12/29/18 107.5 kg    EKG today demonstrates SR HR 78, NST, PR 134, QRS 82, QTc 344  Echo 10/23/11 demonstrated  Left ventricle: The cavity size was normal. Wall thickness  was normal. Systolic function was normal. The estimated  ejection fraction was in the range of 55% to 60%. Features  are consistent with a pseudonormal left ventricular filling  pattern, with concomitant abnormal relaxation and increased  filling pressure (grade 2 diastolic dysfunction).    Epic records are reviewed at length today  Assessment and Plan:  1. Paroxysmal atrial fibrillation Patient has a history of afib and atrial flutter in setting of CAP in 2013 and now in setting of COVID-19.  Patient appears to be maintaining SR. We discussed his stroke risk and the risks and benefits of anticoagulation. No anticoagulation indicated at this time per guidelines. Patient voices  understanding and is in agreement with plan. Will stop Eliquis Continue diltiazem 90 mg BID Lifestyle changes as below.  This patients CHA2DS2-VASc Score and unadjusted Ischemic Stroke Rate (% per year) is equal to 0.2 % stroke rate/year from a score of 0  Above score calculated as 1 point each if present [CHF, HTN, DM, Vascular=MI/PAD/Aortic Plaque, Age if 65-74, or Male] Above score calculated as 2 points each if present [Age > 75, or Stroke/TIA/TE]   2. Obesity Body mass index is 33.26 kg/m. Lifestyle modification was discussed at length including regular exercise and weight reduction.  3. Snoring/witnessed apnea/daytime somnolence The importance of adequate treatment of sleep apnea was discussed today in order to improve our ability to maintain sinus rhythm long  term. Will arrange for sleep study.   Follow up in the AF clinic in 3 months.   Jorja Loaicky Emmalise Huard PA-C [facility] [address] [place], [place] [address] [phone] 02/27/2019 10:39 AM

## 2019-04-04 ENCOUNTER — Other Ambulatory Visit (HOSPITAL_COMMUNITY): Payer: Self-pay | Admitting: *Deleted

## 2019-04-04 MED ORDER — DILTIAZEM HCL 90 MG PO TABS: 90.0000 mg | ORAL_TABLET | Freq: Two times a day (BID) | ORAL | 6 refills | Status: DC

## 2019-04-08 ENCOUNTER — Emergency Department (HOSPITAL_COMMUNITY)
Admission: EM | Admit: 2019-04-08 | Discharge: 2019-04-08 | Discharge status: Against Medical Advice | Payer: No Typology Code available for payment source

## 2019-04-08 ENCOUNTER — Other Ambulatory Visit: Payer: Self-pay

## 2019-04-08 DIAGNOSIS — Z20822 Contact with and (suspected) exposure to covid-19: Secondary | ICD-10-CM

## 2019-04-09 LAB — NOVEL CORONAVIRUS, NAA: SARS-CoV-2, NAA: NOT DETECTED

## 2019-05-16 ENCOUNTER — Other Ambulatory Visit (HOSPITAL_COMMUNITY): Payer: Self-pay | Admitting: *Deleted

## 2019-05-16 MED ORDER — DILTIAZEM HCL 90 MG PO TABS: 90.0000 mg | ORAL_TABLET | Freq: Two times a day (BID) | ORAL | 6 refills | Status: DC

## 2019-05-29 ENCOUNTER — Ambulatory Visit (HOSPITAL_COMMUNITY)
Admission: RE | Admit: 2019-05-29 | Discharge: 2019-05-29 | Disposition: A | Discharge status: Home / Self Care | Payer: No Typology Code available for payment source | Source: Ambulatory Visit | Attending: Physician Assistant | Admitting: Physician Assistant

## 2019-05-29 ENCOUNTER — Other Ambulatory Visit: Payer: Self-pay

## 2019-05-29 ENCOUNTER — Encounter (HOSPITAL_COMMUNITY): Payer: Self-pay | Admitting: Physician Assistant

## 2019-05-29 VITALS — BP 122/70 | HR 75 | Ht 70.0 in | Wt 236.6 lb

## 2019-05-29 DIAGNOSIS — I5032 Chronic diastolic (congestive) heart failure: Secondary | ICD-10-CM | POA: Diagnosis not present

## 2019-05-29 DIAGNOSIS — Z79899 Other long term (current) drug therapy: Secondary | ICD-10-CM | POA: Insufficient documentation

## 2019-05-29 DIAGNOSIS — Z6833 Body mass index (BMI) 33.0-33.9, adult: Secondary | ICD-10-CM | POA: Insufficient documentation

## 2019-05-29 DIAGNOSIS — G4710 Hypersomnia, unspecified: Secondary | ICD-10-CM | POA: Insufficient documentation

## 2019-05-29 DIAGNOSIS — I4891 Unspecified atrial fibrillation: Secondary | ICD-10-CM | POA: Diagnosis present

## 2019-05-29 DIAGNOSIS — R0683 Snoring: Secondary | ICD-10-CM | POA: Insufficient documentation

## 2019-05-29 DIAGNOSIS — E669 Obesity, unspecified: Secondary | ICD-10-CM | POA: Diagnosis not present

## 2019-05-29 DIAGNOSIS — I480 Paroxysmal atrial fibrillation: Secondary | ICD-10-CM | POA: Diagnosis not present

## 2019-05-29 DIAGNOSIS — Z87891 Personal history of nicotine dependence: Secondary | ICD-10-CM | POA: Diagnosis not present

## 2019-05-29 NOTE — Progress Notes (Signed)
Primary Care Physician: Patient, No Pcp Per Primary Cardiologist: Dr Meda Coffee Primary Electrophysiologist: none Referring Physician: Ian Malkin [facility]   [name] [name] is a 53 y.o. male with a history of paroxysmal afib, atrial flutter, diastolic CHF and tobacco use. He was initially diagnosed with afib in 2013 in the setting of CAP. He spontaneously converted back to SR. Unfortunately, patient tested positive for COVID-19 and was found to be in atrial flutter and later in afib at the ER. He converted to SR after diltiazem drip. He was asymptomatic during the episode. He does admit that he snores loudly and has witnessed apneic episodes by his sleep partner and daytime somnolence. He denies any alcohol use.   On follow up today, patient reports that he has done very well since his last visit. He has not had any sustained heart racing episodes. He is tolerating the medication without difficulty. He continues to have symptoms of severe snoring, witnessed apnea, and daytime somnolence.   Today, he denies symptoms of palpitations, chest pain, shortness of breath, orthopnea, PND, lower extremity edema, dizziness, presyncope, syncope, bleeding, or neurologic sequela. The patient is tolerating medications without difficulties and is otherwise without complaint today.    Atrial Fibrillation Risk Factors:  he does have symptoms or diagnosis of sleep apnea. Will arrange for sleep study. he does have a history of alcohol use. Does not drink currently.   he has a BMI of Body mass index is 33.95 kg/m.Marland Kitchen Filed Weights   05/29/19 0918  Weight: 107.3 kg    Family History  Problem Relation Age of Onset  . Heart disease Neg Hx      Atrial Fibrillation Management history:  Previous antiarrhythmic drugs: none Previous cardioversions: none Previous ablations: none CHADS2VASC score: 0 Anticoagulation history: Eliquis   Past Medical History:  Diagnosis Date  . ARF (acute renal failure) ([place])   .  Atrial flutter ([place])   . Community acquired bacterial pneumonia   . Diastolic CHF, chronic ([place])   . Leukocytosis   . N&V (nausea and vomiting)   . SOB (shortness of breath)   . Substance abuse ([place])    Prior hx of cocaine (quit 5 years ago)/EtOH (clean for 14 months) as of 10/2011   No past surgical history on file.  Current Outpatient Medications  Medication Sig Dispense Refill  . diltiazem (CARDIZEM) 90 MG tablet Take 1 tablet (90 mg total) by mouth every 12 (twelve) hours. 60 tablet 6   No current facility-administered medications for this encounter.     No Known Allergies  Social History   Socioeconomic History  . Marital status: Single    Spouse name: Not on file  . Number of children: Not on file  . Years of education: Not on file  . Highest education level: Not on file  Occupational History  . Not on file  Social Needs  . Financial resource strain: Not on file  . Food insecurity    Worry: Not on file    Inability: Not on file  . Transportation needs    Medical: Not on file    Non-medical: Not on file  Tobacco Use  . Smoking status: Former Research scientist (life sciences)  . Smokeless tobacco: Never Used  . Tobacco comment: Quit April 2013  Substance and Sexual Activity  . Alcohol use: No    Comment: Previously drank heavily, has been sober for 14 months  . Drug use: No    Comment: As of 10/2011, has been clean from crack cocaine  for 5 years. Hx of marijuana.  . Sexual activity: Not on file  Lifestyle  . Physical activity    Days per week: Not on file    Minutes per session: Not on file  . Stress: Not on file  Relationships  . Social Musician on phone: Not on file    Gets together: Not on file    Attends religious service: Not on file    Active member of club or organization: Not on file    Attends meetings of clubs or organizations: Not on file    Relationship status: Not on file  . Intimate partner violence    Fear of current or ex partner: Not on file     Emotionally abused: Not on file    Physically abused: Not on file    Forced sexual activity: Not on file  Other Topics Concern  . Not on file  Social History Narrative  . Not on file     ROS- All systems are reviewed and negative except as per the HPI above.  Physical Exam: Vitals:   05/29/19 0918  BP: 122/70  Pulse: 75  Weight: 107.3 kg  Height: 5\' 10"  (1.778 m)    GEN- The patient is well appearing obese male, alert and oriented x 3 today.   HEENT-head normocephalic, atraumatic, sclera clear, conjunctiva pink, hearing intact, trachea midline. Lungs- Clear to ausculation bilaterally, normal work of breathing Heart- Regular rate and rhythm, no murmurs, rubs or gallops  GI- soft, NT, ND, + BS Extremities- no clubbing, cyanosis, or edema MS- no significant deformity or atrophy Skin- no rash or lesion Psych- euthymic mood, full affect Neuro- strength and sensation are intact   Wt Readings from Last 3 Encounters:  05/29/19 107.3 kg  02/27/19 105.1 kg  01/24/19 107.5 kg    EKG today demonstrates SR HR 75, NST, PR 124, QRS 88, QTc 390  Echo 10/23/11 demonstrated  Left ventricle: The cavity size was normal. Wall thickness  was normal. Systolic function was normal. The estimated  ejection fraction was in the range of 55% to 60%. Features  are consistent with a pseudonormal left ventricular filling  pattern, with concomitant abnormal relaxation and increased  filling pressure (grade 2 diastolic dysfunction).    Epic records are reviewed at length today  Assessment and Plan:  1. Paroxysmal atrial fibrillation Patient has a history of afib and atrial flutter in setting of CAP in 2013 and now in setting of COVID-19.  Patient appears to be maintaining SR. Continue diltiazem 90 mg BID Lifestyle changes as below.  This patients CHA2DS2-VASc Score and unadjusted Ischemic Stroke Rate (% per year) is equal to 0.2 % stroke rate/year from a score of 0  Above score calculated  as 1 point each if present [CHF, HTN, DM, Vascular=MI/PAD/Aortic Plaque, Age if 65-74, or Male] Above score calculated as 2 points each if present [Age > 75, or Stroke/TIA/TE]   2. Obesity Body mass index is 33.95 kg/m. Lifestyle modification was discussed and encouraged including regular physical activity and weight reduction.  3. Snoring/witnessed apnea/daytime somnolence The importance of adequate treatment of sleep apnea was discussed today in order to improve our ability to maintain sinus rhythm long term. Sleep study ordered and is pending.   Follow up in the AF clinic in 6 months.   09-20-2000 PA-C Afib Clinic [facility] [address] [place], [place] [phone] 05/29/2019 9:53 AM

## 2019-06-09 ENCOUNTER — Telehealth: Payer: Self-pay | Admitting: *Deleted

## 2019-06-09 NOTE — Telephone Encounter (Signed)
S/W patient today to ask him for 800 # and ID # from his insurance card so PA can be submitted for sleep study. He states that he will call back after work and leave the information on my phone. He is currently at work and cannot give it to me right now.

## 2019-06-13 ENCOUNTER — Telehealth: Payer: Self-pay | Admitting: *Deleted

## 2019-06-13 NOTE — Telephone Encounter (Signed)
Patient notified of sleep study and COVID appointment details. He voiced understanding of quarantine instructions given to him.

## 2019-06-21 ENCOUNTER — Other Ambulatory Visit (HOSPITAL_COMMUNITY): Payer: PRIVATE HEALTH INSURANCE

## 2019-06-24 ENCOUNTER — Encounter (HOSPITAL_BASED_OUTPATIENT_CLINIC_OR_DEPARTMENT_OTHER): Payer: PRIVATE HEALTH INSURANCE | Admitting: Cardiovascular Disease

## 2019-07-02 ENCOUNTER — Other Ambulatory Visit (HOSPITAL_COMMUNITY): Payer: Self-pay

## 2019-07-02 MED ORDER — DILTIAZEM HCL 90 MG PO TABS: 90.0000 mg | ORAL_TABLET | Freq: Two times a day (BID) | ORAL | 6 refills | Status: DC

## 2019-07-05 ENCOUNTER — Other Ambulatory Visit (HOSPITAL_COMMUNITY)
Admission: RE | Admit: 2019-07-05 | Discharge: 2019-07-05 | Disposition: A | Discharge status: Home / Self Care | Payer: No Typology Code available for payment source | Source: Ambulatory Visit | Attending: Cardiovascular Disease | Admitting: Cardiovascular Disease

## 2019-07-05 DIAGNOSIS — Z01812 Encounter for preprocedural laboratory examination: Secondary | ICD-10-CM | POA: Diagnosis present

## 2019-07-05 DIAGNOSIS — Z20828 Contact with and (suspected) exposure to other viral communicable diseases: Secondary | ICD-10-CM | POA: Insufficient documentation

## 2019-07-05 LAB — SARS CORONAVIRUS 2 (TAT 6-24 HRS): SARS Coronavirus 2: NEGATIVE

## 2019-07-08 ENCOUNTER — Ambulatory Visit (HOSPITAL_BASED_OUTPATIENT_CLINIC_OR_DEPARTMENT_OTHER): Payer: No Typology Code available for payment source | Admitting: Cardiovascular Disease

## 2019-07-25 ENCOUNTER — Other Ambulatory Visit (HOSPITAL_COMMUNITY)
Admission: RE | Admit: 2019-07-25 | Discharge: 2019-07-25 | Disposition: A | Discharge status: Home / Self Care | Payer: No Typology Code available for payment source | Source: Ambulatory Visit | Attending: Cardiovascular Disease | Admitting: Cardiovascular Disease

## 2019-07-25 DIAGNOSIS — Z01812 Encounter for preprocedural laboratory examination: Secondary | ICD-10-CM | POA: Diagnosis not present

## 2019-07-25 DIAGNOSIS — Z20822 Contact with and (suspected) exposure to covid-19: Secondary | ICD-10-CM | POA: Insufficient documentation

## 2019-07-25 LAB — SARS CORONAVIRUS 2 (TAT 6-24 HRS): SARS Coronavirus 2: NEGATIVE

## 2019-07-27 ENCOUNTER — Other Ambulatory Visit: Payer: Self-pay

## 2019-07-27 ENCOUNTER — Ambulatory Visit (HOSPITAL_BASED_OUTPATIENT_CLINIC_OR_DEPARTMENT_OTHER)
Payer: No Typology Code available for payment source | Attending: Physician Assistant | Admitting: Cardiovascular Disease

## 2019-07-27 DIAGNOSIS — I480 Paroxysmal atrial fibrillation: Secondary | ICD-10-CM

## 2019-07-27 DIAGNOSIS — I4891 Unspecified atrial fibrillation: Secondary | ICD-10-CM | POA: Diagnosis not present

## 2019-07-27 DIAGNOSIS — G4733 Obstructive sleep apnea (adult) (pediatric): Secondary | ICD-10-CM

## 2019-07-27 DIAGNOSIS — R0683 Snoring: Secondary | ICD-10-CM | POA: Diagnosis present

## 2019-08-05 NOTE — Procedures (Signed)
Patient Name: [name] [name], [name] [name] Date: 07/27/2019 Gender: Male D.O.B: [birth year] Age (years): 38 Referring Provider: Malka So PA Height (inches): 70 Interpreting Physician: Shelva Majestic MD, ABSM Weight (lbs): 240 RPSGT: Gwenyth Allegra BMI: 34 MRN: [id number] Neck Size: 18.00  CLINICAL INFORMATION Sleep Study Type: Split Night CPAP  Indication for sleep study: Snoring, Atrial fibrillation  Epworth Sleepiness Score: 6  SLEEP STUDY TECHNIQUE As per the AASM Manual for the Scoring of Sleep and Associated Events v2.3 (April 2016) with a hypopnea requiring 4% desaturations.  The channels recorded and monitored were frontal, central and occipital EEG, electrooculogram (EOG), submentalis EMG (chin), nasal and oral airflow, thoracic and abdominal wall motion, anterior tibialis EMG, snore microphone, electrocardiogram, and pulse oximetry. Continuous positive airway pressure (CPAP) was initiated when the patient met split night criteria and was titrated according to treat sleep-disordered breathing.  MEDICATIONS diltiazem (CARDIZEM) 90 MG tablet(Expired)   Medications self-administered by patient taken the night of the study : N/A  RESPIRATORY PARAMETERS Diagnostic Total AHI (/hr): 33.9 RDI (/hr): 44.7 OA Index (/hr): 21.2 CA Index (/hr): 0.0 REM AHI (/hr): 61.9 NREM AHI (/hr): 30.0 Supine AHI (/hr): 89.0 Non-supine AHI (/hr): 17.2 Min O2 Sat (%): 86.0 Mean O2 (%): 93.2 Time below 88% (min): 2.5   Titration Optimal Pressure (cm): 12 AHI at Optimal Pressure (/hr): 3.6 Min O2 at Optimal Pressure (%): 91.0 Supine % at Optimal (%): 0 Sleep % at Optimal (%): 82   SLEEP ARCHITECTURE The recording time for the entire night was 361.2 minutes.  During a baseline period of 145.1 minutes, the patient slept for 127.5 minutes in REM and nonREM, yielding a sleep efficiency of 87.9%%. Sleep onset after lights out was 11.4 minutes with a REM latency of 92.0 minutes. The patient  spent 4.3%% of the night in stage N1 sleep, 83.5%% in stage N2 sleep, 0.0%% in stage N3 and 12.2% in REM.  During the titration period of 215.1 minutes, the patient slept for 135.8 minutes in REM and nonREM, yielding a sleep efficiency of 63.1%%. Sleep onset after CPAP initiation was 5.3 minutes with a REM latency of 64.5 minutes. The patient spent 15.3%% of the night in stage N1 sleep, 40.5%% in stage N2 sleep, 0.0%% in stage N3 and 44.2% in REM.  CARDIAC DATA The 2 lead EKG demonstrated sinus rhythm. The mean heart rate was 100.0 beats per minute. Other EKG findings include: None.  LEG MOVEMENT DATA The total Periodic Limb Movements of Sleep (PLMS) were 0. The PLMS index was 0.0 .  IMPRESSIONS - Severe obstructive sleep apnea occurred during the diagnostic portion of the study (AHI 33.9/h; RDI 44.7/h). Events were more severe during REM sleep (AHI 61.9/h).  CPAP was initiated at 7 cm and was titrated to 13 cm of water. Several central events were present at 12 and 13 cm. An optimal PAP pressure was felt to be 11 cm of water with AHI 2.4 and O2 nadir at 91%. - No significant central sleep apnea occurred during the diagnostic portion of the study (CAI = 0.0/hour). - The patient had mild oxygen desaturation during the diagnostic portion of the study (Min O2 = 86.0%) - The patient snored with loud snoring volume during the diagnostic portion of the study. - No cardiac abnormalities were noted during this study. - Clinically significant periodic limb movements did not occur during sleep.  DIAGNOSIS - Obstructive Sleep Apnea (327.23 [G47.33 ICD-10])  RECOMMENDATIONS - Recommend an initiat trial of CPAP therapy with  EPR at 11 cm H2O with heated humidification. A Medium size Fisher&Paykel Full Face Mask Simplus mask was used for the titration. - Effort should be made to optimize nasal and oropharyngeal patency. - Avoid alcohol, sedatives and other CNS depressants that may worsen sleep apnea and  disrupt normal sleep architecture. - Sleep hygiene should be reviewed to assess factors that may improve sleep quality. - Weight management and regular exercise should be initiated or continued. - Recommend a download in 30 days and sleep clinic evaluation after 4 weeks of therapy.  [Electronically signed] 08/05/2019 06:10 PM  Shelva Majestic MD, [facility], [place], American Board of Sleep Medicine   NPI: [id number] [place] PH: [phone]   FX: [phone] [place]

## 2019-08-07 ENCOUNTER — Other Ambulatory Visit (HOSPITAL_COMMUNITY): Payer: Self-pay | Admitting: *Deleted

## 2019-08-07 ENCOUNTER — Telehealth: Payer: Self-pay | Admitting: *Deleted

## 2019-08-07 MED ORDER — DILTIAZEM HCL 90 MG PO TABS: 90.0000 mg | ORAL_TABLET | Freq: Two times a day (BID) | ORAL | 6 refills | Status: DC

## 2019-08-07 NOTE — Telephone Encounter (Signed)
Patient and wife were given sleep study results and recommendations. All questions were answered satisfactory. CPAP orders sent to [facility].

## 2019-08-07 NOTE — Telephone Encounter (Signed)
Per Angie @ [facility] they do not accept patient's insurance. CPAP referral sent to [facility].

## 2019-08-30 DIAGNOSIS — Z13220 Encounter for screening for lipoid disorders: Secondary | ICD-10-CM | POA: Diagnosis not present

## 2019-08-30 DIAGNOSIS — I482 Chronic atrial fibrillation, unspecified: Secondary | ICD-10-CM | POA: Diagnosis not present

## 2019-08-30 DIAGNOSIS — Z1159 Encounter for screening for other viral diseases: Secondary | ICD-10-CM | POA: Diagnosis not present

## 2019-08-30 DIAGNOSIS — Z114 Encounter for screening for human immunodeficiency virus [HIV]: Secondary | ICD-10-CM | POA: Diagnosis not present

## 2019-08-30 DIAGNOSIS — Z131 Encounter for screening for diabetes mellitus: Secondary | ICD-10-CM | POA: Diagnosis not present

## 2019-08-30 DIAGNOSIS — R8290 Unspecified abnormal findings in urine: Secondary | ICD-10-CM | POA: Diagnosis not present

## 2019-08-30 DIAGNOSIS — Z0000 Encounter for general adult medical examination without abnormal findings: Secondary | ICD-10-CM | POA: Diagnosis not present

## 2019-08-30 DIAGNOSIS — Z125 Encounter for screening for malignant neoplasm of prostate: Secondary | ICD-10-CM | POA: Diagnosis not present

## 2019-08-30 DIAGNOSIS — R0602 Shortness of breath: Secondary | ICD-10-CM | POA: Diagnosis not present

## 2019-09-13 ENCOUNTER — Other Ambulatory Visit: Payer: Self-pay

## 2019-09-13 ENCOUNTER — Emergency Department (HOSPITAL_BASED_OUTPATIENT_CLINIC_OR_DEPARTMENT_OTHER)
Admission: EM | Admit: 2019-09-13 | Discharge: 2019-09-13 | Disposition: A | Discharge status: Home / Self Care | Payer: BC Managed Care – PPO | Attending: Emergency Medicine | Admitting: Emergency Medicine

## 2019-09-13 ENCOUNTER — Encounter (HOSPITAL_BASED_OUTPATIENT_CLINIC_OR_DEPARTMENT_OTHER): Payer: Self-pay

## 2019-09-13 DIAGNOSIS — I5032 Chronic diastolic (congestive) heart failure: Secondary | ICD-10-CM | POA: Diagnosis not present

## 2019-09-13 DIAGNOSIS — J029 Acute pharyngitis, unspecified: Secondary | ICD-10-CM | POA: Insufficient documentation

## 2019-09-13 DIAGNOSIS — Z87891 Personal history of nicotine dependence: Secondary | ICD-10-CM | POA: Diagnosis not present

## 2019-09-13 LAB — GROUP A STREP BY PCR: Group A Strep by PCR: NOT DETECTED

## 2019-09-13 MED ORDER — AMOXICILLIN 500 MG PO CAPS
500.0000 mg | ORAL_CAPSULE | Freq: Once | ORAL | Status: AC
  Administered 2019-09-13: 500 mg via ORAL
  Filled 2019-09-13: qty 1

## 2019-09-13 MED ORDER — AMOXICILLIN 500 MG PO CAPS: 500.0000 mg | ORAL_CAPSULE | Freq: Three times a day (TID) | ORAL | 0 refills | Status: DC

## 2019-09-13 NOTE — ED Triage Notes (Signed)
Pt arrives ambulatory to ED with c/o sore throat pt states multiple times "I need an antibiotic".

## 2019-09-13 NOTE — ED Provider Notes (Signed)
[facility] EMERGENCY DEPARTMENT Provider Note   CSN: [id number] Arrival date & time: 09/13/19  1113     History Chief Complaint  Patient presents with  . Sore Throat    [name] [name] is a 54 y.o. male.  Pt presents to the ED today with a sore throat.  The pt has had sx for about 3 weeks.  He has not had fever.  He has tried several otc meds, but they have not helped.  Pt did have Covid in July 2020.  Last Covid check in Jan was neg.        Past Medical History:  Diagnosis Date  . ARF (acute renal failure) ([place])   . Atrial flutter ([place])   . Community acquired bacterial pneumonia   . Diastolic CHF, chronic ([place])   . Leukocytosis   . N&V (nausea and vomiting)   . SOB (shortness of breath)   . Substance abuse ([place])    Prior hx of cocaine (quit 5 years ago)/EtOH (clean for 14 months) as of 10/2011    Patient Active Problem List   Diagnosis Date Noted  . Paroxysmal atrial fibrillation ([place]) 05/29/2019  . Substance abuse ([place])   . Diastolic CHF, chronic   . Diastolic CHF, chronic ([place]) 10/24/2011  . Atrial flutter ([place]) 10/22/2011  . Community acquired bacterial pneumonia 10/21/2011  . SOB (shortness of breath) 10/21/2011  . N&V (nausea and vomiting) 10/21/2011  . ARF (acute renal failure) ([place]) 10/21/2011  . Tobacco abuse 10/21/2011  . Leukocytosis 10/21/2011  . Fever 10/21/2011    History reviewed. No pertinent surgical history.     Family History  Problem Relation Age of Onset  . Heart disease Neg Hx     Social History   Tobacco Use  . Smoking status: Former Research scientist (life sciences)  . Smokeless tobacco: Never Used  . Tobacco comment: Quit April 2013  Substance Use Topics  . Alcohol use: No    Comment: Previously drank heavily, has been sober for 14 months  . Drug use: No    Comment: As of 10/2011, has been clean from crack cocaine for 5 years. Hx of marijuana.    Home Medications Prior to Admission medications   Medication Sig Start Date End Date  Taking? Authorizing Provider  amoxicillin (AMOXIL) 500 MG capsule Take 1 capsule (500 mg total) by mouth 3 (three) times daily. 09/13/19   Isla Pence, MD  diltiazem (CARDIZEM) 90 MG tablet Take 1 tablet (90 mg total) by mouth every 12 (twelve) hours. 08/07/19 09/06/19  Fenton, Clint R, PA    Allergies    Patient has no known allergies.  Review of Systems   Review of Systems  HENT: Positive for sore throat.   All other systems reviewed and are negative.   Physical Exam Updated Vital Signs BP (!) 138/97 (BP Location: Right Arm)   Pulse 86   Temp 98.6 F (37 C) (Oral)   Resp 20   Ht 5\' 10"  (1.778 m)   Wt 107.5 kg   SpO2 98%   BMI 34.01 kg/m   Physical Exam Vitals and nursing note reviewed.  Constitutional:      Appearance: He is well-developed.  HENT:     Head: Normocephalic and atraumatic.     Right Ear: Ear canal normal.     Left Ear: Ear canal normal.     Mouth/Throat:     Mouth: Mucous membranes are moist.     Pharynx: Posterior oropharyngeal erythema present.  Eyes:  Conjunctiva/sclera: Conjunctivae normal.     Pupils: Pupils are equal, round, and reactive to light.  Cardiovascular:     Rate and Rhythm: Normal rate and regular rhythm.  Pulmonary:     Effort: Pulmonary effort is normal.     Breath sounds: Normal breath sounds.  Abdominal:     General: Bowel sounds are normal.     Palpations: Abdomen is soft.  Musculoskeletal:     Cervical back: Normal range of motion and neck supple.  Skin:    General: Skin is warm and dry.     Capillary Refill: Capillary refill takes less than 2 seconds.  Neurological:     General: No focal deficit present.     Mental Status: He is alert and oriented to person, place, and time.  Psychiatric:        Mood and Affect: Mood normal.        Behavior: Behavior normal.     ED Results / Procedures / Treatments   Labs (all labs ordered are listed, but only abnormal results are displayed) Labs Reviewed  GROUP A STREP BY  PCR    EKG None  Radiology No results found.  Procedures Procedures (including critical care time)  Medications Ordered in ED Medications  amoxicillin (AMOXIL) capsule 500 mg (has no administration in time range)    ED Course  I have reviewed the triage vital signs and the nursing notes.  Pertinent labs & imaging results that were available during my care of the patient were reviewed by me and considered in my medical decision making (see chart for details).    MDM Rules/Calculators/A&P                     Pt does not have strep.  However, sx have been going on for 3 weeks without improvement.  I will put him on amox.  He knows to return if worse.  F/u with pcp.  Final Clinical Impression(s) / ED Diagnoses Final diagnoses:  Pharyngitis, unspecified etiology    Rx / DC Orders ED Discharge Orders         Ordered    amoxicillin (AMOXIL) 500 MG capsule  3 times daily     09/13/19 1225           Jacalyn Lefevre, MD 09/13/19 1240

## 2019-11-27 ENCOUNTER — Ambulatory Visit (HOSPITAL_COMMUNITY): Payer: PRIVATE HEALTH INSURANCE | Admitting: Physician Assistant

## 2019-12-04 ENCOUNTER — Ambulatory Visit (HOSPITAL_COMMUNITY): Payer: BC Managed Care – PPO | Admitting: Physician Assistant

## 2019-12-12 ENCOUNTER — Other Ambulatory Visit: Payer: Self-pay

## 2019-12-12 ENCOUNTER — Emergency Department (HOSPITAL_BASED_OUTPATIENT_CLINIC_OR_DEPARTMENT_OTHER)
Admission: EM | Admit: 2019-12-12 | Discharge: 2019-12-12 | Disposition: A | Discharge status: Home / Self Care | Payer: BC Managed Care – PPO | Attending: Emergency Medicine | Admitting: Emergency Medicine

## 2019-12-12 ENCOUNTER — Encounter (HOSPITAL_BASED_OUTPATIENT_CLINIC_OR_DEPARTMENT_OTHER): Payer: Self-pay

## 2019-12-12 DIAGNOSIS — I480 Paroxysmal atrial fibrillation: Secondary | ICD-10-CM | POA: Insufficient documentation

## 2019-12-12 DIAGNOSIS — I5032 Chronic diastolic (congestive) heart failure: Secondary | ICD-10-CM | POA: Diagnosis not present

## 2019-12-12 DIAGNOSIS — R590 Localized enlarged lymph nodes: Secondary | ICD-10-CM | POA: Insufficient documentation

## 2019-12-12 DIAGNOSIS — Z79899 Other long term (current) drug therapy: Secondary | ICD-10-CM | POA: Diagnosis not present

## 2019-12-12 DIAGNOSIS — Z87891 Personal history of nicotine dependence: Secondary | ICD-10-CM | POA: Insufficient documentation

## 2019-12-12 DIAGNOSIS — R222 Localized swelling, mass and lump, trunk: Secondary | ICD-10-CM | POA: Diagnosis not present

## 2019-12-12 DIAGNOSIS — R599 Enlarged lymph nodes, unspecified: Secondary | ICD-10-CM

## 2019-12-12 MED ORDER — AMOXICILLIN-POT CLAVULANATE 875-125 MG PO TABS: 1.0000 | ORAL_TABLET | Freq: Two times a day (BID) | ORAL | 0 refills | Status: DC

## 2019-12-12 NOTE — ED Provider Notes (Signed)
[facility] EMERGENCY DEPARTMENT Provider Note   CSN: [id number] Arrival date & time: 12/12/19  1202     History Chief Complaint  Patient presents with  . Abscess    [name] [name] is a 54 y.o. male with a past medical history significant for a flutter, diastolic heart failure, and polysubstance abuse who presents to the ED due to "bumps" in his groin region and right thigh for the past week. Denies any growth in size. Denies pain with palpation. Denies fever and chills. Denies surrounding erythema or drainage. Denies IVDU. Patient has a history of an abscess in the past and notes this feels different because there is no pain. He has not tried anything for his symptoms prior to arrival. No aggravating or alleviating factors.   History obtained from patient and past medical records. No interpreter used during encounter.      Past Medical History:  Diagnosis Date  . ARF (acute renal failure) ([facility])   . Atrial flutter ([facility])   . Community acquired bacterial pneumonia   . Diastolic CHF, chronic ([facility])   . Leukocytosis   . N&V (nausea and vomiting)   . SOB (shortness of breath)   . Substance abuse ([facility])    Prior hx of cocaine (quit 5 years ago)/EtOH (clean for 14 months) as of 10/2011    Patient Active Problem List   Diagnosis Date Noted  . Paroxysmal atrial fibrillation ([facility]) 05/29/2019  . Substance abuse ([facility])   . Diastolic CHF, chronic   . Diastolic CHF, chronic ([facility]) 10/24/2011  . Atrial flutter ([facility]) 10/22/2011  . Community acquired bacterial pneumonia 10/21/2011  . SOB (shortness of breath) 10/21/2011  . N&V (nausea and vomiting) 10/21/2011  . ARF (acute renal failure) ([facility]) 10/21/2011  . Tobacco abuse 10/21/2011  . Leukocytosis 10/21/2011  . Fever 10/21/2011    History reviewed. No pertinent surgical history.     Family History  Problem Relation Age of Onset  . Heart disease Neg Hx     Social History   Tobacco Use  . Smoking status: Former Games developer    . Smokeless tobacco: Never Used  . Tobacco comment: Quit April 2013  Substance Use Topics  . Alcohol use: Not Currently  . Drug use: Not Currently    Home Medications Prior to Admission medications   Medication Sig Start Date End Date Taking? Authorizing Provider  amoxicillin (AMOXIL) 500 MG capsule Take 1 capsule (500 mg total) by mouth 3 (three) times daily. 09/13/19   Jacalyn Lefevre, MD  amoxicillin-clavulanate (AUGMENTIN) 875-125 MG tablet Take 1 tablet by mouth every 12 (twelve) hours. 12/12/19   Mannie Stabile, PA-C  diltiazem (CARDIZEM) 90 MG tablet Take 1 tablet (90 mg total) by mouth every 12 (twelve) hours. 08/07/19 09/06/19  Fenton, Clint R, PA  metFORMIN (GLUCOPHAGE) 500 MG tablet TAKE 1 TABLET BY MOUTH ONCE DAILY FOR 1 WEEK THEN INCREASE TO TWICE DAILY 11/05/19   [provider]    Allergies    Patient has no known allergies.  Review of Systems   Review of Systems  Constitutional: Negative for chills and fever.  Gastrointestinal: Negative for abdominal pain.  Genitourinary: Negative for discharge and testicular pain.  Skin: Positive for wound (mass).  All other systems reviewed and are negative.   Physical Exam Updated Vital Signs BP 134/79 (BP Location: Left Arm)   Pulse 83   Temp 98.4 F (36.9 C) (Oral)   Resp 16   Ht 5\' 10"  (1.778 m)   Wt  104.3 kg   SpO2 98%   BMI 32.99 kg/m   Physical Exam Vitals and nursing note reviewed.  Constitutional:      General: He is not in acute distress.    Appearance: He is not ill-appearing.  HENT:     Head: Normocephalic.  Eyes:     Pupils: Pupils are equal, round, and reactive to light.  Cardiovascular:     Rate and Rhythm: Normal rate and regular rhythm.     Pulses: Normal pulses.     Heart sounds: Normal heart sounds. No murmur. No friction rub. No gallop.   Pulmonary:     Effort: Pulmonary effort is normal.     Breath sounds: Normal breath sounds.  Abdominal:     General: Abdomen is flat. Bowel  sounds are normal. There is no distension.     Palpations: Abdomen is soft.     Tenderness: There is no abdominal tenderness. There is no guarding or rebound.  Genitourinary:    Penis: Normal and circumcised.      Testes: Normal.     Epididymis:     Right: Normal.     Left: Normal.       Comments: 76mm area of induration at bilateral inguinal region and right thigh. No erythema, edema, or warmth. No drainage. No tenderness to palpation.  Musculoskeletal:     Cervical back: Neck supple.     Comments: Able to move all 4 extremities without difficulty.   Skin:    General: Skin is warm and dry.  Neurological:     General: No focal deficit present.     Mental Status: He is alert.  Psychiatric:        Mood and Affect: Mood normal.        Behavior: Behavior normal.     ED Results / Procedures / Treatments   Labs (all labs ordered are listed, but only abnormal results are displayed) Labs Reviewed - No data to display  EKG None  Radiology No results found.  Procedures Procedures (including critical care time)  Medications Ordered in ED Medications - No data to display  ED Course  I have reviewed the triage vital signs and the nursing notes.  Pertinent labs & imaging results that were available during my care of the patient were reviewed by me and considered in my medical decision making (see chart for details).    MDM Rules/Calculators/A&P                     54 year old male presents to the ED due to 3 "bumps" in his bilateral groin region and right thigh for the past week.  Vitals all within normal limits.  Patient is afebrile, not tachycardic or hypoxic.  Patient in no acute distress and non-ill-appearing.  12mm area of induration in bilateral inguinal region and right thigh without tenderness to palpation.  No drainage, erythema, warmth.  Low suspicion for abscess.  Suspect areas in inguinal region are lymph nodes. Will discharge patient with Augmentin for lymphadenitis.  Advised patient to follow-up with PCP if bums  to not improve in 3 weeks after antibiotic treatment for further evaluation. [facility] number given to patient at discharge. Strict ED precautions discussed with patient. Patient states understanding and agrees to plan. Patient discharged home in no acute distress and stable vitals.  Discussed case with Dr. Roslynn Amble who agrees with plan.   Final Clinical Impression(s) / ED Diagnoses Final diagnoses:  Lymph nodes enlarged  Rx / DC Orders ED Discharge Orders         Ordered    amoxicillin-clavulanate (AUGMENTIN) 875-125 MG tablet  Every 12 hours     12/12/19 1340           Mannie Stabile, [place] 12/12/19 1405    Milagros Loll, MD 12/14/19 [phone]

## 2019-12-12 NOTE — ED Triage Notes (Signed)
Pt c/o abscess to right groin and to right thigh x 1 week-NAD-steady gait

## 2019-12-12 NOTE — Discharge Instructions (Signed)
As discussed, it appears you have enlarged lymph nodes. I am sending you home with an antibiotic. Wait a week and if your symptoms do not improve, start taking the antibiotic. You will take it twice a day for 7 days. If the area does not improve after 3 weeks, please return to the ER or [facility] for further evaluation (I have included the number for [facility]).

## 2019-12-12 NOTE — ED Notes (Signed)
ED Provider at bedside. 

## 2019-12-25 ENCOUNTER — Other Ambulatory Visit: Payer: Self-pay

## 2019-12-25 ENCOUNTER — Ambulatory Visit (HOSPITAL_COMMUNITY)
Admission: RE | Admit: 2019-12-25 | Discharge: 2019-12-25 | Disposition: A | Discharge status: Home / Self Care | Payer: BC Managed Care – PPO | Source: Ambulatory Visit | Attending: Physician Assistant | Admitting: Physician Assistant

## 2019-12-25 ENCOUNTER — Encounter (HOSPITAL_COMMUNITY): Payer: Self-pay | Admitting: Physician Assistant

## 2019-12-25 VITALS — BP 136/78 | HR 71 | Ht 70.0 in | Wt 232.8 lb

## 2019-12-25 DIAGNOSIS — Z87891 Personal history of nicotine dependence: Secondary | ICD-10-CM | POA: Insufficient documentation

## 2019-12-25 DIAGNOSIS — I4892 Unspecified atrial flutter: Secondary | ICD-10-CM | POA: Insufficient documentation

## 2019-12-25 DIAGNOSIS — E669 Obesity, unspecified: Secondary | ICD-10-CM | POA: Insufficient documentation

## 2019-12-25 DIAGNOSIS — N179 Acute kidney failure, unspecified: Secondary | ICD-10-CM | POA: Insufficient documentation

## 2019-12-25 DIAGNOSIS — R55 Syncope and collapse: Secondary | ICD-10-CM | POA: Diagnosis not present

## 2019-12-25 DIAGNOSIS — Z8616 Personal history of COVID-19: Secondary | ICD-10-CM | POA: Insufficient documentation

## 2019-12-25 DIAGNOSIS — I5032 Chronic diastolic (congestive) heart failure: Secondary | ICD-10-CM | POA: Insufficient documentation

## 2019-12-25 DIAGNOSIS — G4733 Obstructive sleep apnea (adult) (pediatric): Secondary | ICD-10-CM | POA: Insufficient documentation

## 2019-12-25 DIAGNOSIS — Z6833 Body mass index (BMI) 33.0-33.9, adult: Secondary | ICD-10-CM | POA: Diagnosis not present

## 2019-12-25 DIAGNOSIS — I480 Paroxysmal atrial fibrillation: Secondary | ICD-10-CM | POA: Diagnosis not present

## 2019-12-25 DIAGNOSIS — Z79899 Other long term (current) drug therapy: Secondary | ICD-10-CM | POA: Insufficient documentation

## 2019-12-25 NOTE — Progress Notes (Signed)
Primary Care Physician: Patient, No Pcp Per Primary Cardiologist: Dr Delton See (remotely) Primary Electrophysiologist: none Referring Physician: Danton Sewer ER   [name] [name] is a 54 y.o. male with a history of paroxysmal afib, atrial flutter, diastolic CHF and tobacco use. He was initially diagnosed with afib in 2013 in the setting of CAP. He spontaneously converted back to SR. Unfortunately, patient tested positive for COVID-19 and was found to be in atrial flutter and later in afib at the ER. He converted to SR after diltiazem drip. He was asymptomatic during the episode. He does admit that he snores loudly and has witnessed apneic episodes by his sleep partner and daytime somnolence. He denies any alcohol use.   On follow up today, patient reports he has done well from an afib standpoint. He denies any heart racing or palpitations. He was diagnosed with severe OSA but has not started CPAP therapy. Patient reports that he has had episodes of LOC when he laughs forcefully. His last episode was ~1 year ago.   Today, he denies symptoms of palpitations, chest pain, shortness of breath, orthopnea, PND, lower extremity edema, dizziness, bleeding, or neurologic sequela. The patient is tolerating medications without difficulties and is otherwise without complaint today.    Atrial Fibrillation Risk Factors:  he does have symptoms or diagnosis of sleep apnea. He is not on CPAP therapy. he does have a history of alcohol use. Does not drink currently.   he has a BMI of Body mass index is 33.4 kg/m.Marland Kitchen Filed Weights   12/25/19 1009  Weight: 105.6 kg    Family History  Problem Relation Age of Onset  . Heart disease Neg Hx      Atrial Fibrillation Management history:  Previous antiarrhythmic drugs: none Previous cardioversions: none Previous ablations: none CHADS2VASC score: 0 Anticoagulation history: Eliquis   Past Medical History:  Diagnosis Date  . ARF (acute renal failure) (HCC)    . Atrial flutter (HCC)   . Community acquired bacterial pneumonia   . Diastolic CHF, chronic (HCC)   . Leukocytosis   . N&V (nausea and vomiting)   . SOB (shortness of breath)   . Substance abuse (HCC)    Prior hx of cocaine (quit 5 years ago)/EtOH (clean for 14 months) as of 10/2011   No past surgical history on file.  Current Outpatient Medications  Medication Sig Dispense Refill  . diltiazem (CARDIZEM) 90 MG tablet Take 1 tablet (90 mg total) by mouth every 12 (twelve) hours. 60 tablet 6  . naproxen sodium (ALEVE) 220 MG tablet Take 220 mg by mouth.     No current facility-administered medications for this encounter.    No Known Allergies  Social History   Socioeconomic History  . Marital status: Married    Spouse name: Not on file  . Number of children: Not on file  . Years of education: Not on file  . Highest education level: Not on file  Occupational History  . Not on file  Tobacco Use  . Smoking status: Former Games developer  . Smokeless tobacco: Never Used  . Tobacco comment: Quit April 2013  Vaping Use  . Vaping Use: Never used  Substance and Sexual Activity  . Alcohol use: Not Currently  . Drug use: Not Currently  . Sexual activity: Not on file  Other Topics Concern  . Not on file  Social History Narrative  . Not on file   Social Determinants of Health   Financial Resource Strain:   . Difficulty  of Paying Living Expenses:   Food Insecurity:   . Worried About Programme researcher, broadcasting/film/video in the Last Year:   . Barista in the Last Year:   Transportation Needs:   . Freight forwarder (Medical):   Marland Kitchen Lack of Transportation (Non-Medical):   Physical Activity:   . Days of Exercise per Week:   . Minutes of Exercise per Session:   Stress:   . Feeling of Stress :   Social Connections:   . Frequency of Communication with Friends and Family:   . Frequency of Social Gatherings with Friends and Family:   . Attends Religious Services:   . Active Member of Clubs  or Organizations:   . Attends Banker Meetings:   Marland Kitchen Marital Status:   Intimate Partner Violence:   . Fear of Current or Ex-Partner:   . Emotionally Abused:   Marland Kitchen Physically Abused:   . Sexually Abused:      ROS- All systems are reviewed and negative except as per the HPI above.  Physical Exam: Vitals:   12/25/19 1009  BP: 136/78  Pulse: 71  Weight: 105.6 kg  Height: 5\' 10"  (1.778 m)    GEN- The patient is well appearing obese male, alert and oriented x 3 today.   HEENT-head normocephalic, atraumatic, sclera clear, conjunctiva pink, hearing intact, trachea midline. Lungs- Clear to ausculation bilaterally, normal work of breathing Heart- Regular rate and rhythm, no murmurs, rubs or gallops  GI- soft, NT, ND, + BS Extremities- no clubbing, cyanosis, or edema MS- no significant deformity or atrophy Skin- no rash or lesion Psych- euthymic mood, full affect Neuro- strength and sensation are intact   Wt Readings from Last 3 Encounters:  12/25/19 105.6 kg  12/12/19 104.3 kg  09/13/19 107.5 kg    EKG today demonstrates SR HR 71, PR 126, QRS 98, QTc 380  Echo 10/23/11 demonstrated  Left ventricle: The cavity size was normal. Wall thickness  was normal. Systolic function was normal. The estimated  ejection fraction was in the range of 55% to 60%. Features  are consistent with a pseudonormal left ventricular filling  pattern, with concomitant abnormal relaxation and increased  filling pressure (grade 2 diastolic dysfunction).    Epic records are reviewed at length today  Assessment and Plan:  1. Paroxysmal atrial fibrillation Patient appears to be maintaining SR. Continue diltiazem 90 mg BID No indication for anticoagulation at this time. Lifestyle changes as below.  This patients CHA2DS2-VASc Score and unadjusted Ischemic Stroke Rate (% per year) is equal to 0.2 % stroke rate/year from a score of 0  Above score calculated as 1 point each if present [CHF,  HTN, DM, Vascular=MI/PAD/Aortic Plaque, Age if 65-74, or Male] Above score calculated as 2 points each if present [Age > 75, or Stroke/TIA/TE]  2. Obesity Body mass index is 33.4 kg/m. Lifestyle modification was discussed and encouraged including regular physical activity and weight reduction.  3. Severe OSA The importance of adequate treatment of sleep apnea was discussed today in order to improve our ability to maintain sinus rhythm long term. Encouraged patient to reach out to sleep medicine to get started on CPAP.  4. Syncope Occurred in the setting of forceful laughing/coughing. Likely vasovagal. Has not had an episode in the last year.  If he has recurrence, will consider ambulatory heart monitoring for screen for cardiogenic cause.    Follow up in the AF clinic in 6 months.    [name] Montarius Kitagawa PA-C Afib  [facility] [place], [place] [address] [phone] 12/25/2019 10:48 AM

## 2019-12-25 NOTE — Patient Instructions (Signed)
Dr. Tresa Endo office for sleep apnea follow up and equipment [phone]. Ask for Burna Mortimer

## 2020-06-23 NOTE — Progress Notes (Signed)
Primary Care Physician: Patient, No Pcp Per Primary Cardiologist: Dr Delton See (remotely) Primary Electrophysiologist: none Referring Physician: Danton Sewer ER   July Nickson is a 54 y.o. male with a history of paroxysmal afib, atrial flutter, diastolic CHF, OSA, and tobacco use. He was initially diagnosed with afib in 2013 in the setting of CAP. He spontaneously converted back to SR. Unfortunately, patient tested positive for COVID-19 and was found to be in atrial flutter and later in afib at the ER. He converted to SR after diltiazem drip. He was asymptomatic during the episode. He denies any alcohol use.   On follow up today, patient reports he has done well from a cardiac standpoint. He denies any heart racing or palpitations. He does continue to have daytime somnolence. He reports CPAP is too expensive for him.   Today, he denies symptoms of palpitations, chest pain, shortness of breath, orthopnea, PND, lower extremity edema, dizziness, bleeding, or neurologic sequela. The patient is tolerating medications without difficulties and is otherwise without complaint today.    Atrial Fibrillation Risk Factors:  he does have symptoms or diagnosis of sleep apnea. He is not on CPAP therapy. he does have a history of alcohol use. Does not drink currently.   he has a BMI of Body mass index is 34.84 kg/m.Marland Kitchen Filed Weights   06/24/20 1005  Weight: 110.1 kg    Family History  Problem Relation Age of Onset  . Heart disease Neg Hx      Atrial Fibrillation Management history:  Previous antiarrhythmic drugs: none Previous cardioversions: none Previous ablations: none CHADS2VASC score: 0 Anticoagulation history: Eliquis   Past Medical History:  Diagnosis Date  . ARF (acute renal failure) (HCC)   . Atrial flutter (HCC)   . Community acquired bacterial pneumonia   . Diastolic CHF, chronic (HCC)   . Leukocytosis   . N&V (nausea and vomiting)   . SOB (shortness of breath)   .  Substance abuse (HCC)    Prior hx of cocaine (quit 5 years ago)/EtOH (clean for 14 months) as of 10/2011   No past surgical history on file.  Current Outpatient Medications  Medication Sig Dispense Refill  . diltiazem (CARDIZEM) 90 MG tablet Take 1 tablet (90 mg total) by mouth every 12 (twelve) hours. 60 tablet 6  . esomeprazole (NEXIUM) 20 MG packet Take 20 mg by mouth daily before breakfast.     No current facility-administered medications for this encounter.    No Known Allergies  Social History   Socioeconomic History  . Marital status: Married    Spouse name: Not on file  . Number of children: Not on file  . Years of education: Not on file  . Highest education level: Not on file  Occupational History  . Not on file  Tobacco Use  . Smoking status: Former Games developer  . Smokeless tobacco: Never Used  . Tobacco comment: Quit April 2013  Vaping Use  . Vaping Use: Never used  Substance and Sexual Activity  . Alcohol use: Not Currently  . Drug use: Not Currently  . Sexual activity: Not on file  Other Topics Concern  . Not on file  Social History Narrative  . Not on file   Social Determinants of Health   Financial Resource Strain: Not on file  Food Insecurity: Not on file  Transportation Needs: Not on file  Physical Activity: Not on file  Stress: Not on file  Social Connections: Not on file  Intimate Partner Violence: Not  on file     ROS- All systems are reviewed and negative except as per the HPI above.  Physical Exam: Vitals:   06/24/20 1005  BP: (!) 142/70  Pulse: 79  Weight: 110.1 kg  Height: 5\' 10"  (1.778 m)    GEN- The patient is well appearing, alert and oriented x 3 today.   HEENT-head normocephalic, atraumatic, sclera clear, conjunctiva pink, hearing intact, trachea midline. Lungs- Clear to ausculation bilaterally, normal work of breathing Heart- Regular rate and rhythm, no murmurs, rubs or gallops  GI- soft, NT, ND, + BS Extremities- no clubbing,  cyanosis, or edema MS- no significant deformity or atrophy Skin- no rash or lesion Psych- euthymic mood, full affect Neuro- strength and sensation are intact   Wt Readings from Last 3 Encounters:  06/24/20 110.1 kg  12/25/19 105.6 kg  12/12/19 104.3 kg    EKG today demonstrates SR HR 79, NST, PR 140, QRS 90, QTc 380  Echo 10/23/11 demonstrated  Left ventricle: The cavity size was normal. Wall thickness  was normal. Systolic function was normal. The estimated  ejection fraction was in the range of 55% to 60%. Features  are consistent with a pseudonormal left ventricular filling  pattern, with concomitant abnormal relaxation and increased  filling pressure (grade 2 diastolic dysfunction).    Epic records are reviewed at length today  Assessment and Plan:  1. Paroxysmal atrial fibrillation Patient appears to be maintaining SR. Continue diltiazem 90 mg BID No indication for anticoagulation at this time. Lifestyle changes as below.  This patients CHA2DS2-VASc Score and unadjusted Ischemic Stroke Rate (% per year) is equal to 0.2 % stroke rate/year from a score of 0  Above score calculated as 1 point each if present [CHF, HTN, DM, Vascular=MI/PAD/Aortic Plaque, Age if 65-74, or Male] Above score calculated as 2 points each if present [Age > 75, or Stroke/TIA/TE]  2. Obesity Body mass index is 34.84 kg/m. Lifestyle modification was discussed and encouraged including regular physical activity and weight reduction.  3. Severe OSA Patient states CPAP is not affordable for him. Will have CSW contact him to see if there are any options.    Follow up in the AF clinic in one year.    09-20-1998 PA-C [facility] [address] [place], [place] [phone] 06/24/2020 10:11 AM

## 2020-06-24 ENCOUNTER — Ambulatory Visit (HOSPITAL_COMMUNITY)
Admission: RE | Admit: 2020-06-24 | Discharge: 2020-06-24 | Disposition: A | Discharge status: Home / Self Care | Payer: BC Managed Care – PPO | Source: Ambulatory Visit | Attending: Physician Assistant | Admitting: Physician Assistant

## 2020-06-24 ENCOUNTER — Telehealth (HOSPITAL_COMMUNITY): Payer: Self-pay | Admitting: Licensed Clinical Social Worker

## 2020-06-24 ENCOUNTER — Other Ambulatory Visit: Payer: Self-pay

## 2020-06-24 ENCOUNTER — Encounter (HOSPITAL_COMMUNITY): Payer: Self-pay | Admitting: Physician Assistant

## 2020-06-24 VITALS — BP 142/70 | HR 79 | Ht 70.0 in | Wt 242.8 lb

## 2020-06-24 DIAGNOSIS — Z87891 Personal history of nicotine dependence: Secondary | ICD-10-CM | POA: Diagnosis not present

## 2020-06-24 DIAGNOSIS — I480 Paroxysmal atrial fibrillation: Secondary | ICD-10-CM | POA: Diagnosis not present

## 2020-06-24 DIAGNOSIS — Z8616 Personal history of COVID-19: Secondary | ICD-10-CM | POA: Diagnosis not present

## 2020-06-24 DIAGNOSIS — I4892 Unspecified atrial flutter: Secondary | ICD-10-CM | POA: Insufficient documentation

## 2020-06-24 DIAGNOSIS — G4733 Obstructive sleep apnea (adult) (pediatric): Secondary | ICD-10-CM | POA: Insufficient documentation

## 2020-06-24 DIAGNOSIS — Z6834 Body mass index (BMI) 34.0-34.9, adult: Secondary | ICD-10-CM | POA: Diagnosis not present

## 2020-06-24 DIAGNOSIS — I5032 Chronic diastolic (congestive) heart failure: Secondary | ICD-10-CM | POA: Insufficient documentation

## 2020-06-24 DIAGNOSIS — Z7182 Exercise counseling: Secondary | ICD-10-CM | POA: Insufficient documentation

## 2020-06-24 DIAGNOSIS — Z79899 Other long term (current) drug therapy: Secondary | ICD-10-CM | POA: Diagnosis not present

## 2020-06-24 DIAGNOSIS — E669 Obesity, unspecified: Secondary | ICD-10-CM | POA: Diagnosis not present

## 2020-06-24 NOTE — Telephone Encounter (Signed)
CSW contacted patient to follow up on CPAP machine. Patient states that he was prescribed the machine over a year ago but due to covid and the expense he never got the machine. Patient reports that he was told that the machine would cost $100 and then $100 monthly fee going forward. Patient states "I can't afford that". CSW asked patient to follow up with the company and give permission for CSW to speak directly with the CPAP company so further discussion could be had about the possible options to obtain the CPAP. Patient agreeable to plan and will return call to CSW after he speaks with insurance and CPAP company. CSW continues to follow. Lasandra Beech, LCSW, CCSW-MCS [phone]

## 2020-06-30 ENCOUNTER — Telehealth: Payer: Self-pay | Admitting: Cardiovascular Disease

## 2020-06-30 ENCOUNTER — Telehealth (HOSPITAL_COMMUNITY): Payer: Self-pay | Admitting: Licensed Clinical Social Worker

## 2020-06-30 DIAGNOSIS — G4733 Obstructive sleep apnea (adult) (pediatric): Secondary | ICD-10-CM

## 2020-06-30 NOTE — Telephone Encounter (Signed)
CSW called pt to discuss possible assistance with CPAP through [employer] hardship fund.  Unable to reach- left message requesting return call  Burna Sis, LCSW Clinical Social Worker [facility] Desk#: [phone] Cell#: [phone]  Eileen Stanford called from Afib clinic, stated pt is having trouble getting a CPAP.  Please advise if a order can be faxed to her for the CPAP?

## 2020-06-30 NOTE — Telephone Encounter (Signed)
Pt returned CSW call and states he is agreeable to CSW having referral sent to [employer] to check on copays.  CSW emailed pt the hardship program application for him to start working on as well.  CSW called cardiology office and they are sending message to Dr. Tresa Endo and his RN who placed original order back in January to see if another order could be sent to [employer]- awaiting response.  Will continue to follow and assist as needed  Burna Sis, LCSW Clinical Social Worker [facility] Desk#: [phone] Cell#: [phone]

## 2020-06-30 NOTE — Telephone Encounter (Signed)
CSW called pt to discuss possible assistance with CPAP through [employer] hardship fund.  Unable to reach- left message requesting return call  Burna Sis, LCSW Clinical Social Worker [facility] Desk#: [phone] Cell#: [phone]

## 2020-07-07 NOTE — Telephone Encounter (Signed)
Reached out to Rosetta Posner to offer assistance for the patient and faxed her a new prescription.

## 2020-07-08 ENCOUNTER — Telehealth (HOSPITAL_COMMUNITY): Payer: Self-pay | Admitting: Licensed Clinical Social Worker

## 2020-07-08 NOTE — Telephone Encounter (Signed)
CSW able to get order from Dr. Evlyn Clines office for CPAP.  CSW called pt and informed and follow up regarding hardship application for [employer].  Pt request I send application to his wife's email and they will work on it and get back to me as soon as its finished- application emailed  Will continue to follow and assist as needed  Burna Sis, LCSW Clinical Social Worker [facility] Desk#: [phone] Cell#: [phone]

## 2020-07-24 ENCOUNTER — Other Ambulatory Visit (HOSPITAL_COMMUNITY): Payer: Self-pay | Admitting: Physician Assistant

## 2020-08-20 ENCOUNTER — Encounter (HOSPITAL_COMMUNITY): Payer: Self-pay

## 2020-08-20 ENCOUNTER — Other Ambulatory Visit: Payer: Self-pay

## 2020-08-20 ENCOUNTER — Ambulatory Visit (HOSPITAL_COMMUNITY)
Admission: EM | Admit: 2020-08-20 | Discharge: 2020-08-20 | Disposition: A | Discharge status: Home / Self Care | Payer: BC Managed Care – PPO | Attending: Student | Admitting: Student

## 2020-08-20 DIAGNOSIS — R7303 Prediabetes: Secondary | ICD-10-CM

## 2020-08-20 DIAGNOSIS — R631 Polydipsia: Secondary | ICD-10-CM | POA: Diagnosis not present

## 2020-08-20 DIAGNOSIS — Z8679 Personal history of other diseases of the circulatory system: Secondary | ICD-10-CM | POA: Diagnosis not present

## 2020-08-20 LAB — CBG MONITORING, ED: Glucose-Capillary: 264 mg/dL — ABNORMAL HIGH (ref 70–99)

## 2020-08-20 MED ORDER — METFORMIN HCL 500 MG PO TABS: 500.0000 mg | ORAL_TABLET | Freq: Every day | ORAL | 0 refills | Status: DC

## 2020-08-20 NOTE — ED Triage Notes (Signed)
Pt in with c/o excessive thirst that has been going on for the past few weeks.   States that he has not had excessive hunger but his thirst comes and goes. States that at his physical last year and was told he was borderline diabetic

## 2020-08-20 NOTE — ED Provider Notes (Signed)
[facility]    CSN: [id number] Arrival date & time: 08/20/20  1029      History   Chief Complaint Chief Complaint  Patient presents with  . excessive thirst    HPI [name] [name] is a 55 y.o. male presenting for 2 weeks of increased thirst.  History of renal failure, atrial flutter, bacterial pneumonia, diastolic CHF, leukocytosis, substance abuse.  States that he has had increased thirst for 2 weeks, with increased urination.  States he is drinking much more water than before.  Patient describes his diet, which includes a significant amount of candy, sugar, bread, pasta.  Patient states that he has been told he is prediabetic in the past, however he has not followed with a physician in over a year.  Today denies chest pain, abdominal pain/v/d, dizziness, headaches, vision changes  HPI  Past Medical History:  Diagnosis Date  . ARF (acute renal failure) (HCC)   . Atrial flutter (HCC)   . Community acquired bacterial pneumonia   . Diastolic CHF, chronic (HCC)   . Leukocytosis   . N&V (nausea and vomiting)   . SOB (shortness of breath)   . Substance abuse (HCC)    Prior hx of cocaine (quit 5 years ago)/EtOH (clean for 14 months) as of 10/2011    Patient Active Problem List   Diagnosis Date Noted  . Paroxysmal atrial fibrillation (HCC) 05/29/2019  . Substance abuse (HCC)   . Diastolic CHF, chronic   . Diastolic CHF, chronic (HCC) 10/24/2011  . Atrial flutter (HCC) 10/22/2011  . Community acquired bacterial pneumonia 10/21/2011  . SOB (shortness of breath) 10/21/2011  . N&V (nausea and vomiting) 10/21/2011  . ARF (acute renal failure) (HCC) 10/21/2011  . Tobacco abuse 10/21/2011  . Leukocytosis 10/21/2011  . Fever 10/21/2011    History reviewed. No pertinent surgical history.     Home Medications    Prior to Admission medications   Medication Sig Start Date End Date Taking? Authorizing Provider  metFORMIN (GLUCOPHAGE) 500 MG tablet Take 1 tablet (500  mg total) by mouth daily with breakfast. 08/20/20 09/19/20 Yes Rhys Martini, PA-C  diltiazem (CARDIZEM) 90 MG tablet TAKE 1 TABLET BY MOUTH EVERY 12 HOURS 07/26/20   Fenton, Clint R, PA  esomeprazole (NEXIUM) 20 MG packet Take 20 mg by mouth daily before breakfast.    [provider]    Family History Family History  Problem Relation Age of Onset  . Heart disease Neg Hx     Social History Social History   Tobacco Use  . Smoking status: Former Games developer  . Smokeless tobacco: Never Used  . Tobacco comment: Quit April 2013  Vaping Use  . Vaping Use: Never used  Substance Use Topics  . Alcohol use: Not Currently  . Drug use: Not Currently     Allergies   Patient has no known allergies.   Review of Systems Review of Systems  Endocrine: Positive for polyuria.  All other systems reviewed and are negative.    Physical Exam Triage Vital Signs ED Triage Vitals  Enc Vitals Group     BP 08/20/20 1135 127/82     Pulse Rate 08/20/20 1134 83     Resp 08/20/20 1134 20     Temp 08/20/20 1134 97.8 F (36.6 C)     Temp src --      SpO2 08/20/20 1134 98 %     Weight --      Height --  Head Circumference --      Peak Flow --      Pain Score 08/20/20 1133 0     Pain Loc --      Pain Edu? --      Excl. in GC? --    No data found.  Updated Vital Signs BP 127/82   Pulse 83   Temp 97.8 F (36.6 C)   Resp 20   SpO2 98%   Visual Acuity Right Eye Distance:   Left Eye Distance:   Bilateral Distance:    Right Eye Near:   Left Eye Near:    Bilateral Near:     Physical Exam Vitals reviewed.  Constitutional:      Appearance: Normal appearance.  Cardiovascular:     Rate and Rhythm: Normal rate and regular rhythm.     Pulses:          Radial pulses are 2+ on the right side and 2+ on the left side.     Heart sounds: Normal heart sounds.  Pulmonary:     Effort: Pulmonary effort is normal.     Breath sounds: Normal breath sounds.  Abdominal:     Palpations:  Abdomen is soft.     Tenderness: There is no abdominal tenderness. There is no guarding or rebound.  Musculoskeletal:     Right lower leg: No edema.     Left lower leg: No edema.  Neurological:     General: No focal deficit present.     Mental Status: He is alert and oriented to person, place, and time.  Psychiatric:        Mood and Affect: Mood normal.        Behavior: Behavior normal.        Thought Content: Thought content normal.        Judgment: Judgment normal.      UC Treatments / Results  Labs (all labs ordered are listed, but only abnormal results are displayed) Labs Reviewed  CBG MONITORING, ED - Abnormal; Notable for the following components:      Result Value   Glucose-Capillary 264 (*)    All other components within normal limits    EKG   Radiology No results found.  Procedures Procedures (including critical care time)  Medications Ordered in UC Medications - No data to display  Initial Impression / Assessment and Plan / UC Course  I have reviewed the triage vital signs and the nursing notes.  Pertinent labs & imaging results that were available during my care of the patient were reviewed by me and considered in my medical decision making (see chart for details).     For prediabetes/possible new diagnosis of diabetes- fasting CBG 264 today. started him on Metformin 500mg  qd x30 days. Sent referral for PCP. He will follow-up with or PCP in 1 month for medication titration.   Patient with history of CHF and aflutter. Today denies chest pain, pedal edema, etc. Continue current regimen.  Return precautions- chest pain, shortness of breath, new/worsening fevers/chills, confusion, worsening of symptoms despite the above treatment plan, etc.   Spent over 40 minutes obtaining H&P, performing physical, discussing results, treatment plan and plan for follow-up with patient. Patient agrees with plan.   This chart was dictated using voice recognition  software, Dragon. Despite the best efforts of this provider to proofread and correct errors, errors may still occur which can change documentation meaning.   Final Clinical Impressions(s) / UC Diagnoses   Final diagnoses:  Prediabetes  History of CHF (congestive heart failure)     Discharge Instructions     -Start the medication- Metformin (Glucophage), one pill daily with breakfast.  -I've provided a 41-month prescription for this medication. I've also placed a referral for you to establish care with a primary care provider. You should receive a call to set this appointment up.  -Seek immediate medical attention if dizziness, abdominal pain, chest pain, etc.     ED Prescriptions    Medication Sig Dispense Auth. Provider   metFORMIN (GLUCOPHAGE) 500 MG tablet Take 1 tablet (500 mg total) by mouth daily with breakfast. 30 tablet Rhys Martini, PA-C     PDMP not reviewed this encounter.   Rhys Martini, PA-C 08/20/20 1219

## 2020-08-20 NOTE — Discharge Instructions (Addendum)
-  Start the medication- Metformin (Glucophage), one pill daily with breakfast.  -I've provided a 31-month prescription for this medication. I've also placed a referral for you to establish care with a primary care provider. You should receive a call to set this appointment up.  -Seek immediate medical attention if dizziness, abdominal pain, chest pain, etc.

## 2020-09-11 ENCOUNTER — Other Ambulatory Visit: Payer: Self-pay

## 2020-09-11 ENCOUNTER — Encounter (HOSPITAL_COMMUNITY): Payer: Self-pay | Admitting: *Deleted

## 2020-09-11 ENCOUNTER — Ambulatory Visit (HOSPITAL_COMMUNITY)
Admission: EM | Admit: 2020-09-11 | Discharge: 2020-09-11 | Disposition: A | Discharge status: Home / Self Care | Payer: BC Managed Care – PPO | Attending: Family Medicine | Admitting: Family Medicine

## 2020-09-11 DIAGNOSIS — M5442 Lumbago with sciatica, left side: Secondary | ICD-10-CM

## 2020-09-11 DIAGNOSIS — R809 Proteinuria, unspecified: Secondary | ICD-10-CM | POA: Diagnosis not present

## 2020-09-11 DIAGNOSIS — R739 Hyperglycemia, unspecified: Secondary | ICD-10-CM

## 2020-09-11 DIAGNOSIS — Z760 Encounter for issue of repeat prescription: Secondary | ICD-10-CM | POA: Diagnosis not present

## 2020-09-11 DIAGNOSIS — M5441 Lumbago with sciatica, right side: Secondary | ICD-10-CM

## 2020-09-11 LAB — POCT URINALYSIS DIPSTICK, ED / UC
Bilirubin Urine: NEGATIVE
Glucose, UA: >=1000 mg/dL — AB
Hgb urine dipstick: NEGATIVE
Ketones, ur: NEGATIVE mg/dL
Leukocytes,Ua: NEGATIVE
Nitrite: NEGATIVE
Protein, ur: 30 mg/dL — AB
Specific Gravity, Urine: 1.02 (ref 1.005–1.030)
Urobilinogen, UA: 0.2 mg/dL (ref 0.0–1.0)
pH: 5.5 (ref 5.0–8.0)

## 2020-09-11 LAB — CBG MONITORING, ED: Glucose-Capillary: 230 mg/dL — ABNORMAL HIGH (ref 70–99)

## 2020-09-11 MED ORDER — NAPROXEN 375 MG PO TABS: 375.0000 mg | ORAL_TABLET | Freq: Two times a day (BID) | ORAL | 0 refills | Status: DC

## 2020-09-11 MED ORDER — METFORMIN HCL 500 MG PO TABS: 500.0000 mg | ORAL_TABLET | Freq: Two times a day (BID) | ORAL | 0 refills | Status: DC

## 2020-09-11 MED ORDER — METFORMIN HCL 500 MG PO TABS: 500.0000 mg | ORAL_TABLET | Freq: Every day | ORAL | 0 refills | Status: DC

## 2020-09-11 MED ORDER — CYCLOBENZAPRINE HCL 10 MG PO TABS: 10.0000 mg | ORAL_TABLET | Freq: Every day | ORAL | 0 refills | Status: DC

## 2020-09-11 NOTE — ED Triage Notes (Signed)
PT later reported he had back pain and needs pain med RX.

## 2020-09-11 NOTE — ED Provider Notes (Signed)
[facility]    CSN: [id number] Arrival date & time: 09/11/20  1015      History   Chief Complaint Chief Complaint  Patient presents with  . Medication Refill    Metformin     HPI [name] [name] is a 55 y.o. male.   HPI Patient with a medical history significant for CHF, atrial flutter, and diabetes presents today for Metformin refill.  Patient was seen here in clinic February 22 and started on Metformin in refer to primary care to get established.  Patient has reviewed established with primary care and returns here today for medication refill.  His blood sugar on arrival today is 230 he is not checking his blood sugar at home.  Endorses urinary frequency less of increased thirst.  Denies any chest palpitations, chest pain, changes to vision, nausea or vomiting.  Patient also has a history of lumbar and sciatica type pain.  Patient has been taking ibuprofen and aspirin for management of pain without relief.  He is requesting medication for pain management.  He denies any acute injury.  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   Past Medical History:  Diagnosis Date  . ARF (acute renal failure) (HCC)   . Atrial flutter (HCC)   . Community acquired bacterial pneumonia   . Diastolic CHF, chronic (HCC)   . Leukocytosis   . N&V (nausea and vomiting)   . SOB (shortness of breath)   . Substance abuse (HCC)    Prior hx of cocaine (quit 5 years ago)/EtOH (clean for 14 months) as of 10/2011    Patient Active Problem List   Diagnosis Date Noted  . Paroxysmal atrial fibrillation (HCC) 05/29/2019  . Substance abuse (HCC)   . Diastolic CHF, chronic   . Diastolic CHF, chronic (HCC) 10/24/2011  . Atrial flutter (HCC) 10/22/2011  . Community acquired bacterial pneumonia 10/21/2011  . SOB (shortness of breath) 10/21/2011  . N&V (nausea and vomiting) 10/21/2011  . ARF (acute renal failure) (HCC) 10/21/2011  . Tobacco abuse  10/21/2011  . Leukocytosis 10/21/2011  . Fever 10/21/2011    History reviewed. No pertinent surgical history.     Home Medications    Prior to Admission medications   Medication Sig Start Date End Date Taking? Authorizing Provider  diltiazem (CARDIZEM) 90 MG tablet TAKE 1 TABLET BY MOUTH EVERY 12 HOURS 07/26/20   Fenton, Clint R, PA  esomeprazole (NEXIUM) 20 MG packet Take 20 mg by mouth daily before breakfast.    [provider]  metFORMIN (GLUCOPHAGE) 500 MG tablet Take 1 tablet (500 mg total) by mouth daily with breakfast. 09/11/20 10/11/20  Bing Neighbors, FNP    Family History Family History  Problem Relation Age of Onset  . Heart disease Neg Hx     Social History Social History   Tobacco Use  . Smoking status: Former Games developer  . Smokeless tobacco: Never Used  . Tobacco comment: Quit April 2013  Vaping Use  . Vaping Use: Never used  Substance Use Topics  . Alcohol use: Not Currently  . Drug use: Not Currently     Allergies   Patient has no known allergies.   Review of Systems Review of Systems Pertinent negatives listed in HPI  Physical Exam Triage Vital Signs ED Triage Vitals  Enc Vitals Group     BP 09/11/20 1047 135/81     Pulse Rate 09/11/20 1047 71     Resp  09/11/20 1047 16     Temp 09/11/20 1047 98.1 F (36.7 C)     Temp Source 09/11/20 1047 Oral     SpO2 09/11/20 1047 95 %     Weight --      Height --      Head Circumference --      Peak Flow --      Pain Score 09/11/20 1049 10     Pain Loc --      Pain Edu? --      Excl. in [place]? --    No data found.  Updated Vital Signs BP 135/81 (BP Location: Right Arm)   Pulse 71   Temp 98.1 F (36.7 C) (Oral)   Resp 16   SpO2 95%   Visual Acuity Right Eye Distance:   Left Eye Distance:   Bilateral Distance:    Right Eye Near:   Left Eye Near:    Bilateral Near:     Physical Exam General appearance: alert, well developed, well nourished, cooperative  Head: Normocephalic,  without obvious abnormality, atraumatic Respiratory: Respirations even and unlabored, normal respiratory rate Heart:Rate and rhythm normal. No gallop or murmurs noted on exam  Musculoskeletal: Lumbar spine no deformity, gait normal, localized lumbar tenderness Skin: Skin color, texture, turgor normal. No rashes seen  Psych: Appropriate mood and affect. Neurologic: GCS 15, normal coordination, normal strength  UC Treatments / Results  Labs (all labs ordered are listed, but only abnormal results are displayed) Labs Reviewed  POCT URINALYSIS DIPSTICK, ED / UC - Abnormal; Notable for the following components:      Result Value   Glucose, UA >=1000 (*)    Protein, ur 30 (*)    All other components within normal limits  CBG MONITORING, ED - Abnormal; Notable for the following components:   Glucose-Capillary 230 (*)    All other components within normal limits  CBG MONITORING, ED    EKG   Radiology No results found.  Procedures Procedures (including critical care time)  Medications Ordered in UC Medications - No data to display  Initial Impression / Assessment and Plan / UC Course  I have reviewed the triage vital signs and the nursing notes.  Pertinent labs & imaging results that were available during my care of the patient were reviewed by me and considered in my medical decision making (see chart for details).    Hyperglycemia refill Metformin increase dosage to 500 mg twice daily to improve management of blood sugars.  Patient given direct contact information to get established at Primary Care at [facility]. For back pain advised to discontinue taking aspirin along with ibuprofen due to increased risk of GI bleed and medication combination is been ineffective.  Prescribed naproxen 375 to take as needed for back pain along with cyclobenzaprine at bedtime.  Advised to avoid use of muscle relaxer during work hours as medication can cause severe drowsiness.  Patient verbalized  understanding and agreement with plan and advised that he will contact primary care on Monday to establish new patient appointment. Final Clinical Impressions(s) / UC Diagnoses   Final diagnoses:  Hyperglycemia  Proteinuria, unspecified type  Acute bilateral low back pain with bilateral sciatica     Discharge Instructions     Your blood sugar is elevated today  Contact either [facility] or [facility] to schedule a new patient appointment for primary care.    ED Prescriptions    Medication Sig Dispense Auth. Provider   metFORMIN (GLUCOPHAGE)  500 MG tablet  (Status: Discontinued) Take 1 tablet (500 mg total) by mouth daily with breakfast. 30 tablet Bing Neighbors, FNP   metFORMIN (GLUCOPHAGE) 500 MG tablet Take 1 tablet (500 mg total) by mouth 2 (two) times daily with a meal. 60 tablet Bing Neighbors, FNP   cyclobenzaprine (FLEXERIL) 10 MG tablet Take 1 tablet (10 mg total) by mouth at bedtime. 30 tablet Bing Neighbors, FNP   naproxen (NAPROSYN) 375 MG tablet Take 1 tablet (375 mg total) by mouth 2 (two) times daily. 20 tablet Bing Neighbors, FNP     I have reviewed the PDMP during this encounter.   Bing Neighbors, FNP 09/11/20 1126

## 2020-09-11 NOTE — Discharge Instructions (Addendum)
Your blood sugar is elevated today  Contact either [facility] or [facility] to schedule a new patient appointment for primary care.

## 2020-09-11 NOTE — ED Triage Notes (Signed)
Pt reports he only has 3 metformin pills and needs a refill. Pt was given the RX at [facility]. Pt does not have a PCP and wants a referral to a PCP. Pt reports he had a phone call with the telephone # to call a PCP but did not save the number.

## 2020-09-16 ENCOUNTER — Encounter (HOSPITAL_COMMUNITY): Payer: Self-pay | Admitting: Emergency Medicine

## 2020-09-16 ENCOUNTER — Other Ambulatory Visit: Payer: Self-pay

## 2020-09-16 ENCOUNTER — Ambulatory Visit (HOSPITAL_COMMUNITY)
Admission: EM | Admit: 2020-09-16 | Discharge: 2020-09-16 | Disposition: A | Discharge status: Home / Self Care | Payer: BC Managed Care – PPO | Attending: Emergency Medicine | Admitting: Emergency Medicine

## 2020-09-16 DIAGNOSIS — H538 Other visual disturbances: Secondary | ICD-10-CM

## 2020-09-16 DIAGNOSIS — E119 Type 2 diabetes mellitus without complications: Secondary | ICD-10-CM | POA: Diagnosis not present

## 2020-09-16 DIAGNOSIS — Z7984 Long term (current) use of oral hypoglycemic drugs: Secondary | ICD-10-CM | POA: Diagnosis not present

## 2020-09-16 DIAGNOSIS — T887XXA Unspecified adverse effect of drug or medicament, initial encounter: Secondary | ICD-10-CM | POA: Diagnosis not present

## 2020-09-16 LAB — CBG MONITORING, ED: Glucose-Capillary: 117 mg/dL — ABNORMAL HIGH (ref 70–99)

## 2020-09-16 MED ORDER — FREESTYLE SYSTEM KIT: 1.0000 | PACK | Freq: Every day | 0 refills | Status: DC

## 2020-09-16 MED ORDER — GLUCOSE BLOOD VI STRP: ORAL_STRIP | 2 refills | Status: DC

## 2020-09-16 NOTE — ED Provider Notes (Signed)
HPI  SUBJECTIVE:  [name] [name] is a 55 y.o. male who presents with constantly bilateral blurry vision starting about 6 days ago when he was started on Naprosyn, a muscle relaxant for back pain and had his Metformin increased to 1 tab twice a day.  He discontinued the muscle relaxant 2 days ago and the Naprosyn yesterday.  He states that both his near vision and far vision are blurry.  He is still taking the Metformin.  He denies eye pain, nausea, vomiting, headache, double vision, visual loss, slurred speech, facial droop, arm or leg weakness, discoordination, palpitations, chest pain, shortness of breath.  Questionable occasional photophobia.  He wears glasses for distance and for reading.  Has not tried anything for this.  No aggravating or alleviating factors.  He does not check his glucose at home, does not have a glucometer.  He has a past medical history of paroxysmal atrial flutter, is on diltiazem, states that his cardiologist discontinued Eliquis "a long time ago".  Also has a history of CHF, substance abuse, has been clean since 2008, acute renal failure, diabetes on Metformin.  No history of stroke, glaucoma.  PMD: He is establishing care with a primary care provider on 4/8.    Past Medical History:  Diagnosis Date  . ARF (acute renal failure) ([place])   . Atrial flutter ([place])   . Community acquired bacterial pneumonia   . Diastolic CHF, chronic ([place])   . Leukocytosis   . N&V (nausea and vomiting)   . SOB (shortness of breath)   . Substance abuse ([place])    Prior hx of cocaine (quit 5 years ago)/EtOH (clean for 14 months) as of 10/2011    History reviewed. No pertinent surgical history.  Family History  Problem Relation Age of Onset  . Heart disease Neg Hx     Social History   Tobacco Use  . Smoking status: Former Research scientist (life sciences)  . Smokeless tobacco: Never Used  . Tobacco comment: Quit April 2013  Vaping Use  . Vaping Use: Never used  Substance Use Topics  . Alcohol use: Not  Currently  . Drug use: Not Currently    No current facility-administered medications for this encounter.  Current Outpatient Medications:  .  glucose blood test strip, Check your sugar in the morning before you eat breakfast, and one hour after a meal., Disp: 100 each, Rfl: 2 .  glucose monitoring kit (FREESTYLE) monitoring kit, 1 each by Does not apply route daily. Check glucose once in the morning before breakfast and 1 hour after a meal, Disp: 1 each, Rfl: 0 .  diltiazem (CARDIZEM) 90 MG tablet, TAKE 1 TABLET BY MOUTH EVERY 12 HOURS, Disp: 60 tablet, Rfl: 6 .  esomeprazole (NEXIUM) 20 MG packet, Take 20 mg by mouth daily before breakfast., Disp: , Rfl:  .  metFORMIN (GLUCOPHAGE) 500 MG tablet, Take 1 tablet (500 mg total) by mouth 2 (two) times daily with a meal., Disp: 60 tablet, Rfl: 0  No Known Allergies   ROS  As noted in HPI.   Physical Exam  BP 127/76 (BP Location: Left Arm)   Pulse 79   Temp 97.9 F (36.6 C) (Oral)   Resp 17   SpO2 96%   Constitutional: Well developed, well nourished, no acute distress Eyes:  EOMI, conjunctiva normal bilaterally, PERRLA, no direct or consensual photophobia.  All Visual fields intact to confrontation.  Funduscopic normal bilaterally.  Optic discs sharp.   Visual Acuity  Right Eye Distance: 20/70 Left Eye  Distance: 20/50 Bilateral Distance: 20/70  Right Eye Near:   Left Eye Near:    Bilateral Near:     HENT: Normocephalic, atraumatic,mucus membranes moist Respiratory: Normal inspiratory effort Cardiovascular: Normal rate GI: nondistended skin: No rash, skin intact Musculoskeletal: no deformities Neurologic: Alert & oriented x 3, cranial nerves II through XII intact, finger-nose intact.  Tandem gait steady. Psychiatric: Speech and behavior appropriate   ED Course   Medications - No data to display  Orders Placed This Encounter  Procedures  . Visual acuity screening    Standing Status:   Standing    Number of  Occurrences:   1  . POC CBG monitoring    Standing Status:   Standing    Number of Occurrences:   1    Results for orders placed or performed during the hospital encounter of 09/16/20 (from the past 24 hour(s))  POC CBG monitoring     Status: Abnormal   Collection Time: 09/16/20 10:31 AM  Result Value Ref Range   Glucose-Capillary 117 (H) 70 - 99 mg/dL   No results found.  ED Clinical Impression  1. Blurry vision, bilateral   2. Side effect of medication   3. Type 2 diabetes mellitus without complication, without long-term current use of insulin (HCC)      ED Assessment/Plan  1.  Blurry vision.  Visual fields intact, patient has some issues with distance vision, but this is a known problem.  He is neurologically intact.  Doubt stroke, CRAO/CRVO.  Glucose normal.  Blurry vision is not due to hyperglycemia, and it is not a side effect of Metformin, but it is a side effect of Naprosyn.  Suspect that his symptoms are coming from the Naprosyn.  We will have him discontinue this and follow-up with Dr. Harlow Mares, ophthalmologist on call in the next day or 2.  Strict ER return precautions given.  2.  Diabetes.  Will prescribe glucometer and strips.  He is to keep a log of this.  Follow-up with PMD.  Discussed labs,  MDM, treatment plan, and plan for follow-up with patient. Discussed sn/sx that should prompt return to the ED. patient agrees with plan.   Meds ordered this encounter  Medications  . glucose blood test strip    Sig: Check your sugar in the morning before you eat breakfast, and one hour after a meal.    Dispense:  100 each    Refill:  2  . glucose monitoring kit (FREESTYLE) monitoring kit    Sig: 1 each by Does not apply route daily. Check glucose once in the morning before breakfast and 1 hour after a meal    Dispense:  1 each    Refill:  0    *This clinic note was created using Lobby[employer]. Therefore, there may be occasional mistakes despite careful  proofreading.   ?    Melynda Ripple, MD 09/16/20 1109

## 2020-09-16 NOTE — Discharge Instructions (Addendum)
Follow-up with Dr. Cathey Endow in 1 to 2 days.  Go immediately to the ER for the signs and symptoms we discussed.  Check your sugar twice a day, once before breakfast and once 1 hour after meal.  Keep a log of this.  Bring this log with you to your primary care appointment.  Your glucose was normal today.

## 2020-09-16 NOTE — ED Triage Notes (Signed)
Pt presents with blurred vision after starting naproxen on 09/11/20 for lower back pain. States did not have blurred vision before starting medication. Last dose yesterday am.

## 2020-09-21 ENCOUNTER — Ambulatory Visit (HOSPITAL_COMMUNITY)
Admission: EM | Admit: 2020-09-21 | Discharge: 2020-09-21 | Disposition: A | Discharge status: Home / Self Care | Payer: BC Managed Care – PPO | Attending: Emergency Medicine | Admitting: Emergency Medicine

## 2020-09-21 ENCOUNTER — Other Ambulatory Visit: Payer: Self-pay

## 2020-09-21 ENCOUNTER — Encounter (HOSPITAL_COMMUNITY): Payer: Self-pay | Admitting: Emergency Medicine

## 2020-09-21 DIAGNOSIS — H538 Other visual disturbances: Secondary | ICD-10-CM | POA: Diagnosis not present

## 2020-09-21 DIAGNOSIS — H5203 Hypermetropia, bilateral: Secondary | ICD-10-CM | POA: Diagnosis not present

## 2020-09-21 DIAGNOSIS — E119 Type 2 diabetes mellitus without complications: Secondary | ICD-10-CM

## 2020-09-21 LAB — CBG MONITORING, ED: Glucose-Capillary: 94 mg/dL (ref 70–99)

## 2020-09-21 NOTE — ED Provider Notes (Signed)
[place]    CSN: [id number] Arrival date & time: 09/21/20  1415      History   Chief Complaint No chief complaint on file.   HPI [name] [name] is a 55 y.o. male.   [name] [name] is a 55 year old with chief complaint of blurry vision ongoing for the past several weeks.  Reports being recently seen and evaluated for same.   Reports being concerned that this blurry vision was caused by naproxen he was taking for back pain but he has stopped the naproxen as back pain resolved.  Now concerned that blurred vision is caused by metformin. He reports that when he first started the Metformin, it did not cause any problems.  When he increased dose to twice a day, his blurry vision started.  Reports being evaluated by ophthalmologist this am and was told that he did not have any diabetic eye disease.  PCP scheduled for 4/8.   Denies any fevers, chest pain, shortness of breath, N/V/D, abdominal pain, or headaches.    ROS: As per HPI, all other pertinent ROS negative   The history is provided by the patient.    Past Medical History:  Diagnosis Date  . ARF (acute renal failure) ([place])   . Atrial flutter ([place])   . Community acquired bacterial pneumonia   . Diastolic CHF, chronic ([place])   . Leukocytosis   . N&V (nausea and vomiting)   . SOB (shortness of breath)   . Substance abuse ([place])    Prior hx of cocaine (quit 5 years ago)/EtOH (clean for 14 months) as of 10/2011    Patient Active Problem List   Diagnosis Date Noted  . Paroxysmal atrial fibrillation ([place]) 05/29/2019  . Substance abuse ([place])   . Diastolic CHF, chronic   . Diastolic CHF, chronic ([place]) 10/24/2011  . Atrial flutter ([place]) 10/22/2011  . Community acquired bacterial pneumonia 10/21/2011  . SOB (shortness of breath) 10/21/2011  . N&V (nausea and vomiting) 10/21/2011  . ARF (acute renal failure) ([place]) 10/21/2011  . Tobacco abuse 10/21/2011  . Leukocytosis 10/21/2011  . Fever 10/21/2011    History  reviewed. No pertinent surgical history.     Home Medications    Prior to Admission medications   Medication Sig Start Date End Date Taking? Authorizing Provider  diltiazem (CARDIZEM) 90 MG tablet TAKE 1 TABLET BY MOUTH EVERY 12 HOURS 07/26/20  Yes Fenton, Clint R, PA  metFORMIN (GLUCOPHAGE) 500 MG tablet Take 1 tablet (500 mg total) by mouth 2 (two) times daily with a meal. 09/11/20 10/11/20 Yes Scot Jun, FNP  esomeprazole (NEXIUM) 20 MG packet Take 20 mg by mouth daily before breakfast.    [provider]  glucose blood test strip Check your sugar in the morning before you eat breakfast, and one hour after a meal. 09/16/20   Melynda Ripple, MD  glucose monitoring kit (FREESTYLE) monitoring kit 1 each by Does not apply route daily. Check glucose once in the morning before breakfast and 1 hour after a meal 09/16/20   Melynda Ripple, MD    Family History Family History  Problem Relation Age of Onset  . Heart disease Neg Hx     Social History Social History   Tobacco Use  . Smoking status: Former Research scientist (life sciences)  . Smokeless tobacco: Never Used  . Tobacco comment: Quit April 2013  Vaping Use  . Vaping Use: Never used  Substance Use Topics  . Alcohol use: Not Currently  . Drug use:  Not Currently     Allergies   Patient has no known allergies.   Review of Systems Review of Systems  Constitutional: Negative for fever.  HENT: Negative for ear pain and sinus pain.   Eyes: Positive for visual disturbance. Negative for photophobia.  Respiratory: Negative for chest tightness and shortness of breath.   Cardiovascular: Negative for chest pain.  Neurological: Negative for dizziness, facial asymmetry, weakness, light-headedness, numbness and headaches.     Physical Exam Triage Vital Signs ED Triage Vitals  Enc Vitals Group     BP 09/21/20 1546 (!) 142/79     Pulse Rate 09/21/20 1546 80     Resp 09/21/20 1546 20     Temp 09/21/20 1546 98.7 F (37.1 C)     Temp  Source 09/21/20 1546 Oral     SpO2 09/21/20 1546 96 %     Weight --      Height --      Head Circumference --      Peak Flow --      Pain Score 09/21/20 1544 0     Pain Loc --      Pain Edu? --      Excl. in [place]? --    No data found.  Updated Vital Signs BP (!) 142/79 (BP Location: Right Arm) Comment (BP Location): large cuff  Pulse 80   Temp 98.7 F (37.1 C) (Oral)   Resp 20   SpO2 96%   Visual Acuity Right Eye Distance:   Left Eye Distance:   Bilateral Distance:    Right Eye Near:   Left Eye Near:    Bilateral Near:     Physical Exam Vitals and nursing note reviewed.  Constitutional:      General: He is not in acute distress.    Appearance: Normal appearance. He is not ill-appearing, toxic-appearing or diaphoretic.  HENT:     Head: Normocephalic and atraumatic.  Eyes:     Extraocular Movements: Extraocular movements intact.     Conjunctiva/sclera: Conjunctivae normal.     Pupils: Pupils are equal, round, and reactive to light.  Cardiovascular:     Rate and Rhythm: Normal rate.     Pulses: Normal pulses.  Pulmonary:     Effort: Pulmonary effort is normal.  Abdominal:     General: Abdomen is flat.  Musculoskeletal:        General: Normal range of motion.     Cervical back: Normal range of motion and neck supple.  Skin:    General: Skin is warm and dry.  Neurological:     General: No focal deficit present.     Mental Status: He is alert and oriented to person, place, and time.     GCS: GCS eye subscore is 4. GCS verbal subscore is 5. GCS motor subscore is 6.     Sensory: Sensation is intact.     Motor: Motor function is intact.     Coordination: Coordination is intact.     Gait: Gait is intact.  Psychiatric:        Mood and Affect: Mood normal.      UC Treatments / Results  Labs (all labs ordered are listed, but only abnormal results are displayed) Labs Reviewed  CBG MONITORING, ED    EKG   Radiology No results  found.  Procedures Procedures (including critical care time)  Medications Ordered in UC Medications - No data to display  Initial Impression / Assessment and Plan / UC Course  I have reviewed the triage vital signs and the nursing notes.  Pertinent labs & imaging results that were available during my care of the patient were reviewed by me and considered in my medical decision making (see chart for details).     No focal neuro deficits or concerns for intracranial abnormality or CVA.   CBG 93 in office today.  Blurry vision likely unrelated to hypoglycemia or hyperglycemia. Continue metformin as previously prescribed.  Follow up with PCP as scheduled.    Go to the ED for any headaches, worsening blurred vision, or pain behind eye.   Final Clinical Impressions(s) / UC Diagnoses   Final diagnoses:  Blurry vision  Type 2 diabetes mellitus without complication, without long-term current use of insulin [facility])     Discharge Instructions     Follow up with your Primary Care Provider on 4/8.    Keep taking your Metformin 1 pill in the morning and 1 pill at night.   If your vision gets worse or you develop a severe headache, or pain behind your eye, go directly to the Emergency Department.   Return or go to the Emergency Department if symptoms worsen or do not improve in the next few days.      ED Prescriptions    None     PDMP not reviewed this encounter.   Pearson Forster, NP 09/21/20 [phone]

## 2020-09-21 NOTE — Discharge Instructions (Signed)
Follow up with your Primary Care Provider on 4/8.    Keep taking your Metformin 1 pill in the morning and 1 pill at night.   If your vision gets worse or you develop a severe headache, or pain behind your eye, go directly to the Emergency Department.   Return or go to the Emergency Department if symptoms worsen or do not improve in the next few days.

## 2020-09-21 NOTE — ED Triage Notes (Signed)
Patient reports he has blurred vision.  Reports that since he started metformin 2 times a day, he started getting blurred vision.    Patient was seen 3/10 and reported blurred vision after starting naproxen.    Patient has been to the eye doctor this morning and is complaining of eyes  And blurred vision to the eye doctor who suggested it was metformin.  Patient speaks often of looking at google, quoting information from [employer]

## 2020-10-07 ENCOUNTER — Emergency Department (HOSPITAL_COMMUNITY): Payer: BC Managed Care – PPO

## 2020-10-07 ENCOUNTER — Emergency Department (HOSPITAL_COMMUNITY)
Admission: EM | Admit: 2020-10-07 | Discharge: 2020-10-07 | Disposition: A | Discharge status: Home / Self Care | Payer: BC Managed Care – PPO | Attending: Emergency Medicine | Admitting: Emergency Medicine

## 2020-10-07 DIAGNOSIS — R059 Cough, unspecified: Secondary | ICD-10-CM | POA: Diagnosis not present

## 2020-10-07 DIAGNOSIS — Z7901 Long term (current) use of anticoagulants: Secondary | ICD-10-CM | POA: Insufficient documentation

## 2020-10-07 DIAGNOSIS — I480 Paroxysmal atrial fibrillation: Secondary | ICD-10-CM | POA: Insufficient documentation

## 2020-10-07 DIAGNOSIS — I4892 Unspecified atrial flutter: Secondary | ICD-10-CM | POA: Diagnosis not present

## 2020-10-07 DIAGNOSIS — Z20822 Contact with and (suspected) exposure to covid-19: Secondary | ICD-10-CM | POA: Diagnosis not present

## 2020-10-07 DIAGNOSIS — I5032 Chronic diastolic (congestive) heart failure: Secondary | ICD-10-CM | POA: Insufficient documentation

## 2020-10-07 DIAGNOSIS — R519 Headache, unspecified: Secondary | ICD-10-CM | POA: Diagnosis not present

## 2020-10-07 DIAGNOSIS — H538 Other visual disturbances: Secondary | ICD-10-CM | POA: Insufficient documentation

## 2020-10-07 DIAGNOSIS — Z87891 Personal history of nicotine dependence: Secondary | ICD-10-CM | POA: Diagnosis not present

## 2020-10-07 DIAGNOSIS — E119 Type 2 diabetes mellitus without complications: Secondary | ICD-10-CM | POA: Diagnosis not present

## 2020-10-07 DIAGNOSIS — Z7984 Long term (current) use of oral hypoglycemic drugs: Secondary | ICD-10-CM | POA: Diagnosis not present

## 2020-10-07 DIAGNOSIS — R079 Chest pain, unspecified: Secondary | ICD-10-CM | POA: Diagnosis not present

## 2020-10-07 DIAGNOSIS — R0602 Shortness of breath: Secondary | ICD-10-CM | POA: Diagnosis not present

## 2020-10-07 LAB — TROPONIN I (HIGH SENSITIVITY): Troponin I (High Sensitivity): 13 ng/L (ref <18)

## 2020-10-07 LAB — RESP PANEL BY RT-PCR (FLU A&B, COVID) ARPGX2
Influenza A by PCR: NEGATIVE
Influenza B by PCR: NEGATIVE
SARS Coronavirus 2 by RT PCR: NEGATIVE

## 2020-10-07 LAB — CBC
HCT: 42.8 % (ref 39.0–52.0)
Hemoglobin: 13.6 g/dL (ref 13.0–17.0)
MCH: 21.9 pg — ABNORMAL LOW (ref 26.0–34.0)
MCHC: 31.8 g/dL (ref 30.0–36.0)
MCV: 69 fL — ABNORMAL LOW (ref 80.0–100.0)
Platelets: 279 10*3/uL (ref 150–400)
RBC: 6.2 MIL/uL — ABNORMAL HIGH (ref 4.22–5.81)
RDW: 16.5 % — ABNORMAL HIGH (ref 11.5–15.5)
WBC: 8.8 10*3/uL (ref 4.0–10.5)
nRBC: 0 % (ref 0.0–0.2)

## 2020-10-07 LAB — BASIC METABOLIC PANEL
Anion gap: 8 (ref 5–15)
BUN: 7 mg/dL (ref 6–20)
CO2: 23 mmol/L (ref 22–32)
Calcium: 9.1 mg/dL (ref 8.9–10.3)
Chloride: 106 mmol/L (ref 98–111)
Creatinine, Ser: 1.12 mg/dL (ref 0.61–1.24)
GFR, Estimated: >60 mL/min (ref >60)
Glucose, Bld: 89 mg/dL (ref 70–99)
Potassium: 3.9 mmol/L (ref 3.5–5.1)
Sodium: 137 mmol/L (ref 135–145)

## 2020-10-07 MED ORDER — DILTIAZEM HCL-DEXTROSE 125-5 MG/125ML-% IV SOLN (PREMIX)
5.0000 mg/h | INTRAVENOUS | Status: DC
  Administered 2020-10-07: 5 mg/h via INTRAVENOUS
  Filled 2020-10-07: qty 125

## 2020-10-07 MED ORDER — PROPOFOL 10 MG/ML IV BOLUS
0.5000 mg/kg | Freq: Once | INTRAVENOUS | Status: AC
  Administered 2020-10-07: 51.1 mg via INTRAVENOUS
  Filled 2020-10-07: qty 20

## 2020-10-07 MED ORDER — APIXABAN 5 MG PO TABS: 5.0000 mg | ORAL_TABLET | Freq: Two times a day (BID) | ORAL | 0 refills | Status: DC

## 2020-10-07 MED ORDER — PROPOFOL 10 MG/ML IV BOLUS
INTRAVENOUS | Status: AC | PRN
  Administered 2020-10-07: 50 mg via INTRAVENOUS
  Administered 2020-10-07 (×2): 10 mg via INTRAVENOUS
  Administered 2020-10-07: 20 mg via INTRAVENOUS

## 2020-10-07 MED ORDER — DILTIAZEM LOAD VIA INFUSION
20.0000 mg | Freq: Once | INTRAVENOUS | Status: AC
  Administered 2020-10-07: 20 mg via INTRAVENOUS
  Filled 2020-10-07: qty 20

## 2020-10-07 MED ORDER — SODIUM CHLORIDE 0.9 % IV BOLUS
500.0000 mL | Freq: Once | INTRAVENOUS | Status: AC
  Administered 2020-10-07: 500 mL via INTRAVENOUS

## 2020-10-07 MED ORDER — APIXABAN 5 MG PO TABS
5.0000 mg | ORAL_TABLET | Freq: Two times a day (BID) | ORAL | Status: DC
  Administered 2020-10-07: 5 mg via ORAL
  Filled 2020-10-07 (×2): qty 1

## 2020-10-07 NOTE — ED Provider Notes (Signed)
.  Sedation  Date/Time: 10/07/2020 3:12 PM Performed by: Melene Plan, DO Authorized by: Melene Plan, DO   Consent:    Consent obtained:  Written   Risks discussed:  Allergic reaction, prolonged hypoxia resulting in organ damage, prolonged sedation necessitating reversal, respiratory compromise necessitating ventilatory assistance and intubation, nausea, inadequate sedation and dysrhythmia   Alternatives discussed:  Analgesia without sedation Universal protocol:    Immediately prior to procedure, a time out was called: yes     Patient identity confirmed:  Arm band Indications:    Procedure performed:  Cardioversion   Procedure necessitating sedation performed by:  Physician performing sedation Pre-sedation assessment:    Time since last food or drink:  6   ASA classification: class 1 - normal, healthy patient     Mouth opening:  2 finger widths   Thyromental distance:  3 finger widths   Mallampati score:  II - soft palate, uvula, fauces visible   Neck mobility: normal     Pre-sedation assessments completed and reviewed: airway patency, cardiovascular function, hydration status, mental status, nausea/vomiting, pain level and respiratory function   Immediate pre-procedure details:    Reassessment: Patient reassessed immediately prior to procedure     Reviewed: vital signs     Verified: bag valve mask available, emergency equipment available, intubation equipment available, IV patency confirmed, oxygen available and suction available   Procedure details (see MAR for exact dosages):    Preoxygenation:  Nasal cannula   Sedation:  Propofol   Intended level of sedation: moderate (conscious sedation)   Intra-procedure monitoring:  Blood pressure monitoring, cardiac monitor, continuous capnometry, continuous pulse oximetry, frequent LOC assessments and frequent vital sign checks   Intra-procedure events: none     Total Provider sedation time (minutes):  35 Post-procedure details:     Post-sedation assessments completed and reviewed: airway patency, cardiovascular function, hydration status, mental status, nausea/vomiting, pain level, respiratory function and temperature     Patient is stable for discharge or admission: no     Procedure completion:  Tolerated well, no immediate complications .Cardioversion  Date/Time: 10/07/2020 3:14 PM Performed by: Melene Plan, DO Authorized by: Melene Plan, DO   Consent:    Consent obtained:  Written   Consent given by:  Patient   Risks discussed:  Cutaneous burn, death and induced arrhythmia   Alternatives discussed:  No treatment, rate-control medication and alternative treatment Pre-procedure details:    Cardioversion basis:  Emergent   Rhythm:  Atrial flutter   Electrode placement:  Anterior-lateral Patient sedated: Yes. Refer to sedation procedure documentation for details of sedation.  Attempt one:    Cardioversion mode:  Synchronous   Waveform:  Biphasic   Shock (Joules):  100   Shock outcome:  Conversion to normal sinus rhythm Post-procedure details:    Patient status:  Awake   Patient tolerance of procedure:  Tolerated well, no immediate complications      Melene Plan, DO 10/07/20 1514

## 2020-10-07 NOTE — Discharge Instructions (Addendum)
STOP Sudafed. Discuss cold medications with your cardiologist.   Information on my medicine - ELIQUIS (apixaban)  This medication education was reviewed with me or my healthcare representative as part of my discharge preparation.    Why was Eliquis prescribed for you? Eliquis was prescribed for you to reduce the risk of a blood clot forming that can cause a stroke if you have a medical condition called atrial fibrillation (a type of irregular heartbeat).  What do You need to know about Eliquis ? Take your Eliquis TWICE DAILY - one tablet in the morning and one tablet in the evening with or without food. If you have difficulty swallowing the tablet whole please discuss with your pharmacist how to take the medication safely.  Take Eliquis exactly as prescribed by your doctor and DO NOT stop taking Eliquis without talking to the doctor who prescribed the medication.  Stopping may increase your risk of developing a stroke.  Refill your prescription before you run out.  After discharge, you should have regular check-up appointments with your healthcare provider that is prescribing your Eliquis.  In the future your dose may need to be changed if your kidney function or weight changes by a significant amount or as you get older.  What do you do if you miss a dose? If you miss a dose, take it as soon as you remember on the same day and resume taking twice daily.  Do not take more than one dose of ELIQUIS at the same time to make up a missed dose.  Important Safety Information A possible side effect of Eliquis is bleeding. You should call your healthcare provider right away if you experience any of the following: ? Bleeding from an injury or your nose that does not stop. ? Unusual colored urine (red or dark brown) or unusual colored stools (red or black). ? Unusual bruising for unknown reasons. ? A serious fall or if you hit your head (even if there is no bleeding).  Some medicines may  interact with Eliquis and might increase your risk of bleeding or clotting while on Eliquis. To help avoid this, consult your healthcare provider or pharmacist prior to using any new prescription or non-prescription medications, including herbals, vitamins, non-steroidal anti-inflammatory drugs (NSAIDs) and supplements.  This website has more information on Eliquis (apixaban): http://www.eliquis.com/eliquis/home

## 2020-10-07 NOTE — ED Triage Notes (Signed)
Pt reports 2 days of cough, chest pain and sore throat. Denies recent fever. HR elevated in triage. NAD at present.

## 2020-10-07 NOTE — TOC Benefit Eligibility Note (Signed)
Patient Product/process development scientist completed.    The patient is currently admitted and upon discharge could be taking Eliquis 5 mg.  The current 30 day co-pay is, $0.00.   The patient is insured through [employer]    Roland Earl, CPhT Pharmacy Patient Advocate Specialist [facility] Antimicrobial Stewardship Team Direct Number: [phone]  Fax: [phone]

## 2020-10-07 NOTE — ED Notes (Signed)
Pt verbalizes understanding of discharge instructions. Opportunity for questions and answers were provided. Armband removed by staff, pt discharged from the ED.  

## 2020-10-07 NOTE — ED Provider Notes (Signed)
[facility] EMERGENCY DEPARTMENT Provider Note   CSN: [id number] Arrival date & time: 10/07/20  1216     History Chief Complaint  Patient presents with  . Chest Pain  . Cough    [name] [name] is a 55 y.o. male.  55 year old male with history of diabetes, a flutter (reports compliance with diltiazem), diastolic CHF, presents with complaint of shortness of breath and cough. States he has had blurry vision and felt unwell since starting Metformin 2 months ago, developed a headache on Monday (3 days ago), resolved with Aspirin. Patient took Sudafed with his usual medicines last night, woke up in the middle of the night short of breath. Shortness of breath is worse trying to lie down, spent the night, in his recliner. Chest and head hurt with coughing. No fevers, chills, sick contacts. Patient is vaccinated against COVID.         Past Medical History:  Diagnosis Date  . ARF (acute renal failure) (HCC)   . Atrial flutter (HCC)   . Community acquired bacterial pneumonia   . Diastolic CHF, chronic (HCC)   . Leukocytosis   . N&V (nausea and vomiting)   . SOB (shortness of breath)   . Substance abuse (HCC)    Prior hx of cocaine (quit 5 years ago)/EtOH (clean for 14 months) as of 10/2011    Patient Active Problem List   Diagnosis Date Noted  . Paroxysmal atrial fibrillation (HCC) 05/29/2019  . Substance abuse (HCC)   . Diastolic CHF, chronic   . Diastolic CHF, chronic (HCC) 10/24/2011  . Atrial flutter (HCC) 10/22/2011  . Community acquired bacterial pneumonia 10/21/2011  . SOB (shortness of breath) 10/21/2011  . N&V (nausea and vomiting) 10/21/2011  . ARF (acute renal failure) (HCC) 10/21/2011  . Tobacco abuse 10/21/2011  . Leukocytosis 10/21/2011  . Fever 10/21/2011    No past surgical history on file.     Family History  Problem Relation Age of Onset  . Heart disease Neg Hx     Social History   Tobacco Use  . Smoking status: Former Games developer   . Smokeless tobacco: Never Used  . Tobacco comment: Quit April 2013  Vaping Use  . Vaping Use: Never used  Substance Use Topics  . Alcohol use: Not Currently  . Drug use: Not Currently    Home Medications Prior to Admission medications   Medication Sig Start Date End Date Taking? Authorizing Provider  apixaban (ELIQUIS) 5 MG TABS tablet Take 1 tablet (5 mg total) by mouth 2 (two) times daily. 10/07/20 11/06/20 Yes Jeannie Fend, PA-C  diltiazem (CARDIZEM) 90 MG tablet TAKE 1 TABLET BY MOUTH EVERY 12 HOURS Patient taking differently: Take 90 mg by mouth every 12 (twelve) hours. 07/26/20  Yes Fenton, Clint R, PA  metFORMIN (GLUCOPHAGE) 500 MG tablet Take 1 tablet (500 mg total) by mouth 2 (two) times daily with a meal. 09/11/20 10/11/20 Yes Bing Neighbors, FNP  pseudoephedrine (SUDAFED) 120 MG 12 hr tablet Take 120 mg by mouth every 12 (twelve) hours as needed for congestion.   Yes [provider]    Allergies    Patient has no known allergies.  Review of Systems   Review of Systems  Constitutional: Negative for fever.  HENT: Positive for congestion.   Respiratory: Positive for cough and shortness of breath.   Cardiovascular: Positive for chest pain. Negative for palpitations and leg swelling.  Gastrointestinal: Negative for abdominal pain, nausea and vomiting.  Musculoskeletal:  Negative for arthralgias and myalgias.  Skin: Negative for rash and wound.  Allergic/Immunologic: Positive for immunocompromised state.  Neurological: Positive for headaches. Negative for dizziness and weakness.  Hematological: Does not bruise/bleed easily.  Psychiatric/Behavioral: Negative for confusion.  All other systems reviewed and are negative.   Physical Exam Updated Vital Signs BP 122/80   Pulse 91   Temp 98.6 F (37 C) (Oral)   Resp 18   Ht 5\' 10"  (1.778 m)   Wt 102.1 kg   SpO2 96%   BMI 32.28 kg/m   Physical Exam Vitals and nursing note reviewed.  Constitutional:       General: He is not in acute distress.    Appearance: He is well-developed. He is not diaphoretic.  HENT:     Head: Normocephalic and atraumatic.  Cardiovascular:     Rate and Rhythm: Regular rhythm. Tachycardia present.     Heart sounds: Normal heart sounds.  Pulmonary:     Effort: Pulmonary effort is normal.     Breath sounds: Normal breath sounds.  Chest:     Chest wall: No tenderness.  Abdominal:     Palpations: Abdomen is soft.     Tenderness: There is no abdominal tenderness.  Musculoskeletal:     Right lower leg: No edema.     Left lower leg: No edema.  Skin:    General: Skin is warm and dry.     Findings: No erythema or rash.  Neurological:     Mental Status: He is alert and oriented to person, place, and time.  Psychiatric:        Behavior: Behavior normal.     ED Results / Procedures / Treatments   Labs (all labs ordered are listed, but only abnormal results are displayed) Labs Reviewed  CBC - Abnormal; Notable for the following components:      Result Value   RBC 6.20 (*)    MCV 69.0 (*)    MCH 21.9 (*)    RDW 16.5 (*)    All other components within normal limits  RESP PANEL BY RT-PCR (FLU A&B, COVID) ARPGX2  BASIC METABOLIC PANEL  TROPONIN I (HIGH SENSITIVITY)    EKG EKG Interpretation  Date/Time:  Thursday October 07 2020 14:38:11 EDT Ventricular Rate:  85 PR Interval:  128 QRS Duration: 108 QT Interval:  359 QTC Calculation: 427 R Axis:   41 Text Interpretation: Sinus rhythm RSR' in V1 or V2, probably normal variant Borderline repolarization abnormality replaced aflutter Confirmed by 09-04-1991 [phone]) on 10/07/2020 2:40:01 PM   Radiology DG Chest 2 View  Result Date: 10/07/2020 CLINICAL DATA:  Cough, chest pain and headache for a few days. EXAM: CHEST - 2 VIEW COMPARISON:  Single-view of the chest 01/14/2019. FINDINGS: Lungs clear. Heart size normal. No pneumothorax or pleural fluid. No acute or focal bony abnormality. IMPRESSION: Negative chest.  Electronically Signed   By: 03/17/2019 M.D.   On: 10/07/2020 13:08    Procedures .Critical Care Performed by: 10/09/2020, PA-C Authorized by: Jeannie Fend, PA-C   Critical care provider statement:    Critical care time (minutes):  45   Critical care was time spent personally by me on the following activities:  Discussions with consultants, evaluation of patient's response to treatment, examination of patient, ordering and performing treatments and interventions, ordering and review of laboratory studies, ordering and review of radiographic studies, pulse oximetry, re-evaluation of patient's condition, obtaining history from patient or surrogate and review of old  charts     Medications Ordered in ED Medications  apixaban (ELIQUIS) tablet 5 mg (has no administration in time range)  diltiazem (CARDIZEM) 1 mg/mL load via infusion 20 mg (20 mg Intravenous Bolus from Bag 10/07/20 1320)  sodium chloride 0.9 % bolus 500 mL (0 mLs Intravenous Stopped 10/07/20 1440)  propofol (DIPRIVAN) 10 mg/mL bolus/IV push 51.1 mg (51.1 mg Intravenous Given 10/07/20 1433)  propofol (DIPRIVAN) 10 mg/mL bolus/IV push (10 mg Intravenous Given 10/07/20 1436)    ED Course  I have reviewed the triage vital signs and the nursing notes.  Pertinent labs & imaging results that were available during my care of the patient were reviewed by me and considered in my medical decision making (see chart for details).  Clinical Course as of 10/07/20 1509  Thu Oct 07, 2020  6549 55 year old male with history of prior a flutter, presents with [facility] after taking sudafed for cough/headache/uri symptoms, onset last night. States his chest and head hurt when he coughs. On arrival, appears comfortable while sitting upright, rate 150-160s, likely a flutter, more visible when patient bears down and rate drops to 100 before going back to 150s.  Seen by Dr. Adela Lank, ER attending, plan is for diltiazem bolus and drip, IV fluids  and monitor, may cardiovert as symptoms seem to have started last night.  [LM]  1350 HR currently 116, a flutter on repeat EKG.  [LM]  1443 Hr back to 130s-150s. Discussed cardioversion with patient who is agreeable with plan. Patient was sedated by Dr. Adela Lank and cardioverted successfully.  Plan is to monitor in the ER post sedation, likely dc to follow up in cardiology clinic. Patient was advised not to take Sudafed again. [LM]  1452 CHA2DS2-VASc is 2, plan is to start Eliquis, pharmacy to consult. [LM]  1453 Patient is awake, alert, feeling better.  Awaiting pharmacy consult for Eliquis, thinks he may have a bottle at home (previously on Eliquis and was advised he could stop taking it).  [LM]  1508 Patient will follow up with his cardiologist, also given ambulatory referral to cardiology.  [LM]    Clinical Course User Index [LM] Jeannie Fend, PA-C   MDM Rules/Calculators/A&P     CHA2DS2-VASc Score: 2                     Final Clinical Impression(s) / ED Diagnoses Final diagnoses:  Atrial flutter with rapid ventricular response [facility])    Rx / DC Orders ED Discharge Orders         Ordered    Ambulatory referral to Cardiology        10/07/20 1445    apixaban (ELIQUIS) 5 MG TABS tablet  2 times daily        10/07/20 1505           Jeannie Fend, PA-C 10/07/20 1509    Melene Plan, DO 10/07/20 1514

## 2020-10-07 NOTE — ED Notes (Signed)
Patient transported to X-ray 

## 2020-10-07 NOTE — Sedation Documentation (Signed)
Shock delivered, pt converted from aflutter to NSR.

## 2020-10-14 NOTE — Progress Notes (Signed)
Primary Care Physician: Rema Fendt, NP Primary Cardiologist: Dr Delton See (remotely) Primary Electrophysiologist: none Referring Physician: Danton Sewer ER   [name] [name] is a 55 y.o. male with a history of paroxysmal afib, atrial flutter, diastolic CHF, OSA, DM, and tobacco use. He was initially diagnosed with afib in 2013 in the setting of CAP. He spontaneously converted back to SR. Unfortunately, patient tested positive for COVID-19 and was found to be in atrial flutter and later in afib at the ER. He converted to SR after diltiazem drip. He was asymptomatic during the episode. He denies any alcohol use.   On follow up today, patient has had multiple ED visits for various reasons. He was recently diagnosed with DM and started on metformin. He presented to the ED on 10/07/20 for cough and sore throat and was incidentally found to be in rapid atrial flutter. He underwent DCCV at that time. He does admit to taking psuedophedrine before the onset of his symptoms. He is back in rapid flutter today but asymptomatic. There were no triggers that he could identify.    Today, he denies symptoms of palpitations, chest pain, shortness of breath, orthopnea, PND, lower extremity edema, dizziness, bleeding, or neurologic sequela. The patient is tolerating medications without difficulties and is otherwise without complaint today.    Atrial Fibrillation Risk Factors:  he does have symptoms or diagnosis of sleep apnea. He is not on CPAP therapy. he does have a history of alcohol use. Does not drink currently.   he has a BMI of Body mass index is 31.91 kg/m.Marland Kitchen Filed Weights   10/15/20 0846  Weight: 100.9 kg    Family History  Problem Relation Age of Onset  . Heart disease Neg Hx      Atrial Fibrillation Management history:  Previous antiarrhythmic drugs: none Previous cardioversions: none Previous ablations: none CHADS2VASC score: 1 Anticoagulation history: Eliquis   Past Medical  History:  Diagnosis Date  . ARF (acute renal failure) (HCC)   . Atrial flutter (HCC)   . Community acquired bacterial pneumonia   . Diastolic CHF, chronic (HCC)   . Leukocytosis   . N&V (nausea and vomiting)   . SOB (shortness of breath)   . Substance abuse (HCC)    Prior hx of cocaine (quit 5 years ago)/EtOH (clean for 14 months) as of 10/2011   No past surgical history on file.  Current Outpatient Medications  Medication Sig Dispense Refill  . acetaminophen (TYLENOL) 500 MG tablet Take 500 mg by mouth every 6 (six) hours as needed for mild pain, moderate pain or headache.    Marland Kitchen apixaban (ELIQUIS) 5 MG TABS tablet Take 1 tablet (5 mg total) by mouth 2 (two) times daily. 60 tablet 0  . diltiazem (CARDIZEM) 90 MG tablet TAKE 1 TABLET BY MOUTH EVERY 12 HOURS 60 tablet 6  . metFORMIN (GLUCOPHAGE) 500 MG tablet Take 1 tablet (500 mg total) by mouth 2 (two) times daily with a meal. 60 tablet 0   No current facility-administered medications for this encounter.    No Known Allergies  Social History   Socioeconomic History  . Marital status: Married    Spouse name: Not on file  . Number of children: Not on file  . Years of education: Not on file  . Highest education level: Not on file  Occupational History  . Not on file  Tobacco Use  . Smoking status: Former Games developer  . Smokeless tobacco: Never Used  . Tobacco comment: Quit April  2013  Vaping Use  . Vaping Use: Never used  Substance and Sexual Activity  . Alcohol use: Not Currently  . Drug use: Not Currently  . Sexual activity: Not on file  Other Topics Concern  . Not on file  Social History Narrative  . Not on file   Social Determinants of Health   Financial Resource Strain: Not on file  Food Insecurity: Not on file  Transportation Needs: Not on file  Physical Activity: Not on file  Stress: Not on file  Social Connections: Not on file  Intimate Partner Violence: Not on file     ROS- All systems are reviewed and  negative except as per the HPI above.  Physical Exam: Vitals:   10/15/20 0846  BP: (!) 126/96  Pulse: (!) 159  Weight: 100.9 kg  Height: 5\' 10"  (1.778 m)    GEN- The patient is a well appearing obese male, alert and oriented x 3 today.   HEENT-head normocephalic, atraumatic, sclera clear, conjunctiva pink, hearing intact, trachea midline. Lungs- Clear to ausculation bilaterally, normal work of breathing Heart- Regular rate and rhythm, tachycardia, no murmurs, rubs or gallops  GI- soft, NT, ND, + BS Extremities- no clubbing, cyanosis, or edema MS- no significant deformity or atrophy Skin- no rash or lesion Psych- euthymic mood, full affect Neuro- strength and sensation are intact   Wt Readings from Last 3 Encounters:  10/15/20 100.9 kg  10/07/20 102.1 kg  06/24/20 110.1 kg    EKG today demonstrates  Typical atrial flutter with 2:1 block Vent. rate 159 BPM PR interval * ms QRS duration 82 ms QT/QTcB 300/488 ms  Echo 10/23/11 demonstrated  Left ventricle: The cavity size was normal. Wall thickness  was normal. Systolic function was normal. The estimated  ejection fraction was in the range of 55% to 60%. Features  are consistent with a pseudonormal left ventricular filling  pattern, with concomitant abnormal relaxation and increased  filling pressure (grade 2 diastolic dysfunction).    Epic records are reviewed at length today  Assessment and Plan:  1. Paroxysmal atrial fibrillation/typical atrial flutter S/p DCCV on 10/07/20 He is back in rapid flutter today. We discussed therapeutic options today including AAD, DCCV, and ablation. Will plan to start flecainide 50 mg BID today. F/u on Monday and if he is still out of rhythm, will increase and plan for DCCV. Increase diltiazem to 120 mg BID Check echocardiogram He may be a candidate for ablation with his typical atrial flutter and young age. Stressed importance of getting his CPAP machine. He admits he has not gotten  back to CSW regarding assistance.  Continue Eliquis 5 mg BID  This patients CHA2DS2-VASc Score and unadjusted Ischemic Stroke Rate (% per year) is equal to 0.6 % stroke rate/year from a score of 1  Above score calculated as 1 point each if present [CHF, HTN, DM, Vascular=MI/PAD/Aortic Plaque, Age if 65-74, or Male] Above score calculated as 2 points each if present [Age > 75, or Stroke/TIA/TE]  2. Obesity Body mass index is 31.91 kg/m. Lifestyle modification was discussed and encouraged including regular physical activity and weight reduction.  3. Severe OSA Working with Dr 09-20-2000 office and CSW to get CPAP machine.   Follow up in the AF clinic 10/18/20.   12/18/20 PA-C Af[facility] [address] [place], [place] [phone] 10/15/2020 8:52 AM

## 2020-10-15 ENCOUNTER — Ambulatory Visit (INDEPENDENT_AMBULATORY_CARE_PROVIDER_SITE_OTHER): Payer: BC Managed Care – PPO | Admitting: Family

## 2020-10-15 ENCOUNTER — Encounter (HOSPITAL_COMMUNITY): Payer: Self-pay | Admitting: Physician Assistant

## 2020-10-15 ENCOUNTER — Other Ambulatory Visit: Payer: Self-pay

## 2020-10-15 ENCOUNTER — Encounter: Payer: Self-pay | Admitting: Family

## 2020-10-15 ENCOUNTER — Ambulatory Visit (HOSPITAL_COMMUNITY)
Admission: RE | Admit: 2020-10-15 | Discharge: 2020-10-15 | Disposition: A | Discharge status: Home / Self Care | Payer: BC Managed Care – PPO | Source: Ambulatory Visit | Attending: Physician Assistant | Admitting: Physician Assistant

## 2020-10-15 VITALS — BP 126/96 | HR 159 | Ht 70.0 in | Wt 222.4 lb

## 2020-10-15 VITALS — BP 127/80 | HR 86 | Ht 70.04 in | Wt 220.8 lb

## 2020-10-15 DIAGNOSIS — I483 Typical atrial flutter: Secondary | ICD-10-CM | POA: Insufficient documentation

## 2020-10-15 DIAGNOSIS — Z6831 Body mass index (BMI) 31.0-31.9, adult: Secondary | ICD-10-CM | POA: Insufficient documentation

## 2020-10-15 DIAGNOSIS — Z7689 Persons encountering health services in other specified circumstances: Secondary | ICD-10-CM | POA: Diagnosis not present

## 2020-10-15 DIAGNOSIS — E669 Obesity, unspecified: Secondary | ICD-10-CM | POA: Insufficient documentation

## 2020-10-15 DIAGNOSIS — I4892 Unspecified atrial flutter: Secondary | ICD-10-CM | POA: Diagnosis not present

## 2020-10-15 DIAGNOSIS — Z09 Encounter for follow-up examination after completed treatment for conditions other than malignant neoplasm: Secondary | ICD-10-CM | POA: Diagnosis not present

## 2020-10-15 DIAGNOSIS — Z87891 Personal history of nicotine dependence: Secondary | ICD-10-CM | POA: Insufficient documentation

## 2020-10-15 DIAGNOSIS — G4733 Obstructive sleep apnea (adult) (pediatric): Secondary | ICD-10-CM | POA: Diagnosis not present

## 2020-10-15 DIAGNOSIS — E119 Type 2 diabetes mellitus without complications: Secondary | ICD-10-CM

## 2020-10-15 DIAGNOSIS — I110 Hypertensive heart disease with heart failure: Secondary | ICD-10-CM | POA: Insufficient documentation

## 2020-10-15 DIAGNOSIS — I4819 Other persistent atrial fibrillation: Secondary | ICD-10-CM

## 2020-10-15 DIAGNOSIS — Z7984 Long term (current) use of oral hypoglycemic drugs: Secondary | ICD-10-CM | POA: Diagnosis not present

## 2020-10-15 DIAGNOSIS — Z7901 Long term (current) use of anticoagulants: Secondary | ICD-10-CM | POA: Diagnosis not present

## 2020-10-15 DIAGNOSIS — I480 Paroxysmal atrial fibrillation: Secondary | ICD-10-CM | POA: Diagnosis not present

## 2020-10-15 DIAGNOSIS — I5032 Chronic diastolic (congestive) heart failure: Secondary | ICD-10-CM | POA: Insufficient documentation

## 2020-10-15 LAB — POCT GLYCOSYLATED HEMOGLOBIN (HGB A1C): Hemoglobin A1C: 9.8 % — AB (ref 4.0–5.6)

## 2020-10-15 MED ORDER — DILTIAZEM HCL ER COATED BEADS 120 MG PO CP24: 120.0000 mg | ORAL_CAPSULE | Freq: Two times a day (BID) | ORAL | 3 refills | Status: DC

## 2020-10-15 MED ORDER — SITAGLIPTIN PHOSPHATE 25 MG PO TABS: 25.0000 mg | ORAL_TABLET | Freq: Every day | ORAL | 0 refills | Status: DC

## 2020-10-15 MED ORDER — FLECAINIDE ACETATE 50 MG PO TABS: 50.0000 mg | ORAL_TABLET | Freq: Two times a day (BID) | ORAL | 3 refills | Status: DC

## 2020-10-15 MED ORDER — DAPAGLIFLOZIN PROPANEDIOL 5 MG PO TABS: 5.0000 mg | ORAL_TABLET | Freq: Every day | ORAL | 0 refills | Status: DC

## 2020-10-15 NOTE — Progress Notes (Signed)
Subjective:    [name] [name] - 55 y.o. male MRN [id number]  Date of birth: [birth year]  HPI  [name] [name] is to establish care and hospital discharge follow-up. Patient has a PMH significant for atrial flutter, diastolic congestive heart failure, community acquired bacterial pneumonia, nausea and vomiting, acute renal failure, shortness of breath, leukocytosis, and substance abuse.   Current issues and/or concerns: 1. HOSPITAL DISCHARGE FOLLOW-UP: Visit 10/07/2020 at [facility] Emergency Department per DO note: Clinical Course [id number] 55 year old male with history of prior a flutter, presents with [facility] after taking sudafed for cough/headache/uri symptoms, onset last night. States his chest and head hurt when he coughs. On arrival, appears comfortable while sitting upright, rate 150-160s, likely a flutter, more visible when patient bears down and rate drops to 100 before going back to 150s.  Seen by Dr. Adela Lank, ER attending, plan is for diltiazem bolus and drip, IV fluids and monitor, may cardiovert as symptoms seem to have started last night.  [LM]  1350 HR currently 116, a flutter on repeat EKG.  [LM]  1443 Hr back to 130s-150s. Discussed cardioversion with patient who is agreeable with plan. Patient was sedated by Dr. Adela Lank and cardioverted successfully.  Plan is to monitor in the ER post sedation, likely dc to follow up in cardiology clinic. Patient was advised not to take Sudafed again. [LM]  1452 CHA2DS2-VASc is 2, plan is to start Eliquis, pharmacy to consult. [LM]  1453 Patient is awake, alert, feeling better.  Awaiting pharmacy consult for Eliquis, thinks he may have a bottle at home (previously on Eliquis and was advised he could stop taking it).  [LM]  1508 Patient will follow up with his cardiologist, also given ambulatory referral to cardiology.  [LM]     Visit 10/15/2020 at [facility] per PA note: 1. Paroxysmal atrial  fibrillation/typical atrial flutter S/p DCCV on 10/07/20 He is back in rapid flutter today. We discussed therapeutic options today including AAD, DCCV, and ablation. Will plan to start flecainide 50 mg BID today. F/u on Monday and if he is still out of rhythm, will increase and plan for DCCV. Increase diltiazem to 120 mg BID Check echocardiogram He may be a candidate for ablation with his typical atrial flutter and young age. Stressed importance of getting his CPAP machine. He admits he has not gotten back to CSW regarding assistance.  Continue Eliquis 5 mg BID  This patients CHA2DS2-VASc Score and unadjusted Ischemic Stroke Rate (% per year) is equal to 0.6 % stroke rate/year from a score of 1  Above score calculated as 1 point each if present [CHF, HTN, DM, Vascular=MI/PAD/Aortic Plaque, Age if 65-74, or Male] Above score calculated as 2 points each if present [Age > 75, or Stroke/TIA/TE]  2. Obesity Body mass index is 31.91 kg/m. Lifestyle modification was discussed and encouraged including regular physical activity and weight reduction.  3. Severe OSA Working with Dr Landry Dyke office and CSW to get CPAP machine.   Follow up in the AF clinic 10/18/20.  10/15/2020: Reports feeling stable during today's visit. Plans to follow-up with Atrial Fibrillation Clinic in 3 days.   2. DIABETES TYPE 2:  Visit 09/21/2020 at [facility] per NP note: No focal neuro deficits or concerns for intracranial abnormality or CVA.   CBG 93 in office today.  Blurry vision likely unrelated to hypoglycemia or hyperglycemia. Continue metformin as previously prescribed.  Follow up with PCP as scheduled.  Go to the ED for any headaches, worsening blurred vision, or pain behind eye.    10/15/2020: Last A1C: 9.8% on 10/15/2020 Med Adherence:  [x]  Yes    []  No Medication side effects:  [x]  Yes. Reports he began having blurry vision after Metformin was increased from 1 pill  daily to 2 pills daily. Reports he was receiving medication refills from the emergency department/urgent care awaiting initial visit with primary care provider. Reports he did continue to take Metformin as prescribed and his vision eventually returned to normal. However, he would like to discontinue Metformin because he does feel that it made his vision blurry. He wears readers. Home Monitoring?  [x]  Yes    []  No Home glucose results range: 90's-100's Diet Adherence: [x]  Yes    []  No Exercise: No Last eye exam: 10/11/2020   ROS per HPI   Health Maintenance:  Health Maintenance Due  Topic Date Due  . Hepatitis C Screening  Never done  . URINE MICROALBUMIN  Never done  . COLONOSCOPY (Pts 45-74yrs Insurance coverage will need to be confirmed)  Never done    Past Medical History: Patient Active Problem List   Diagnosis Date Noted  . Paroxysmal atrial fibrillation (HCC) 05/29/2019  . Substance abuse (HCC)   . Diastolic CHF, chronic   . Diastolic CHF, chronic (HCC) 10/24/2011  . Atrial flutter (HCC) 10/22/2011  . Community acquired bacterial pneumonia 10/21/2011  . SOB (shortness of breath) 10/21/2011  . N&V (nausea and vomiting) 10/21/2011  . ARF (acute renal failure) (HCC) 10/21/2011  . Tobacco abuse 10/21/2011  . Leukocytosis 10/21/2011  . Fever 10/21/2011    Social History   reports that he has quit smoking. He has never used smokeless tobacco. He reports previous alcohol use. He reports previous drug use.   Family History  family history is not on file.   Medications: reviewed and updated   Objective:   Physical Exam BP 127/80 (BP Location: Left Arm, Patient Position: Sitting)   Pulse 86   Ht 5' 10.04" (1.779 m)   Wt 220 lb 12.8 oz (100.2 kg)   SpO2 98%   BMI 31.65 kg/m  Physical Exam HENT:     Head: Normocephalic and atraumatic.  Eyes:     Extraocular Movements: Extraocular movements intact.     Conjunctiva/sclera: Conjunctivae normal.     Pupils: Pupils are  equal, round, and reactive to light.  Cardiovascular:     Rate and Rhythm: Tachycardia present.     Pulses: Normal pulses.     Heart sounds: Normal heart sounds.  Pulmonary:     Effort: Pulmonary effort is normal.     Breath sounds: Normal breath sounds.  Musculoskeletal:     Cervical back: Normal range of motion and neck supple.  Neurological:     General: No focal deficit present.     Mental Status: He is alert and oriented to person, place, and time.  Psychiatric:        Mood and Affect: Mood normal.        Behavior: Behavior normal.      Assessment & Plan:  1. Encounter to establish care: - Patient presents today to establish care.  - Return for annual physical examination, labs, and health maintenance. Arrive fasting meaning having no for at least 8 hours prior to appointment. You may have only water or black coffee. Please take scheduled medications as normal.  2. Hospital discharge follow-up: 3. Atrial flutter with rapid ventricular response [facility]): - Visit  earlier today with [facility]. Scheduled for follow-up on 10/18/2020.  4. Type 2 diabetes mellitus without complication, without long-term current use of insulin (HCC): - Hemoglobin A1c not at goal today at 9.8%, goal < 7%. - Patient reports he is unable to take Metformin because it causes him to have blurry vision.  - Begin Sitagliptin and Dapagliflozin Propanediol as prescribed. - Discussed the importance of healthy eating habits, low-carbohydrate diet, low-sugar diet, regular aerobic exercise (at least 150 minutes a week as tolerated) and medication compliance to achieve or maintain control of diabetes. - Microalbumin/creatinine ratio to check kidney function.  - BMP last obtained 10/07/2020. - Follow-up with primary provider in 4 weeks or sooner if needed.  - POCT glycosylated hemoglobin (Hb A1C) - dapagliflozin propanediol (FARXIGA) 5 MG TABS tablet; Take 1 tablet (5 mg total) by mouth daily  before breakfast.  Dispense: 30 tablet; Refill: 0 - sitaGLIPtin (JANUVIA) 25 MG tablet; Take 1 tablet (25 mg total) by mouth daily.  Dispense: 30 tablet; Refill: 0 - Microalbumin/Creatinine Ratio, Urine    Patient was given clear instructions to go to Emergency Department or return to medical center if symptoms don't improve, worsen, or new problems develop.The patient verbalized understanding.  I discussed the assessment and treatment plan with the patient. The patient was provided an opportunity to ask questions and all were answered. The patient agreed with the plan and demonstrated an understanding of the instructions.   The patient was advised to call back or seek an in-person evaluation if the symptoms worsen or if the condition fails to improve as anticipated.   Ricky Stabs, NP 10/17/2020, 9:37 AM Primary Care at [facility]

## 2020-10-15 NOTE — Patient Instructions (Addendum)
Return for annual physical examination, labs, and health maintenance. Arrive fasting meaning having had no food and/or nothing to drink for at least 8 hours prior to appointment.  Please take scheduled medications as normal.  Keep appointments with Cardiology.   Keep appointment with Ophthalmologist. Thank you for choosing Primary Care at [facility] for your medical home!    [name] [name] was seen by Rema Fendt, NP today.   [name] [name]'s primary care provider is Rema Fendt, NP.   For the best care possible,  you should try to see Ricky Stabs, NP whenever you come to clinic.   We look forward to seeing you again soon!  If you have any questions about your visit today,  please call us at [phone]  Or feel free to reach your provider via MyChart.     Atrial Fibrillation  Atrial fibrillation is a type of heartbeat that is irregular or fast. If you have this condition, your heart beats without any order. This makes it hard for your heart to pump blood in a normal way. Atrial fibrillation may come and go, or it may become a long-lasting problem. If this condition is not treated, it can put you at higher risk for stroke, heart failure, and other heart problems. What are the causes? This condition may be caused by diseases that damage the heart. They include:  High blood pressure.  Heart failure.  Heart valve disease.  Heart surgery. Other causes include:  Diabetes.  Thyroid disease.  Being overweight.  Kidney disease. Sometimes the cause is not known. What increases the risk? You are more likely to develop this condition if:  You are older.  You smoke.  You exercise often and very hard.  You have a family history of this condition.  You are a man.  You use drugs.  You drink a lot of alcohol.  You have lung conditions, such as emphysema, pneumonia, or COPD.  You have sleep apnea. What are the signs or symptoms? Common symptoms of  this condition include:  A feeling that your heart is beating very fast.  Chest pain or discomfort.  Feeling short of breath.  Suddenly feeling light-headed or weak.  Getting tired easily during activity.  Fainting.  Sweating. In some cases, there are no symptoms. How is this treated? Treatment for this condition depends on underlying conditions and how you feel when you have atrial fibrillation. They include:  Medicines to: ? Prevent blood clots. ? Treat heart rate or heart rhythm problems.  Using devices, such as a pacemaker, to correct heart rhythm problems.  Doing surgery to remove the part of the heart that sends bad signals.  Closing an area where clots can form in the heart (left atrial appendage). In some cases, your doctor will treat other underlying conditions. Follow these instructions at home: Medicines  Take over-the-counter and prescription medicines only as told by your doctor.  Do not take any new medicines without first talking to your doctor.  If you are taking blood thinners: ? Talk with your doctor before you take any medicines that have aspirin or NSAIDs, such as ibuprofen, in them. ? Take your medicine exactly as told by your doctor. Take it at the same time each day. ? Avoid activities that could hurt or bruise you. Follow instructions about how to prevent falls. ? Wear a bracelet that says you are taking blood thinners. Or, carry a card that lists what medicines you take. Lifestyle  Do not  use any products that have nicotine or tobacco in them. These include cigarettes, e-cigarettes, and chewing tobacco. If you need help quitting, ask your doctor.  Eat heart-healthy foods. Talk with your doctor about the right eating plan for you.  Exercise regularly as told by your doctor.  Do not drink alcohol.  Lose weight if you are overweight.  Do not use drugs, including cannabis.      General instructions  If you have a condition that causes  breathing to stop for a short period of time (apnea), treat it as told by your doctor.  Keep a healthy weight. Do not use diet pills unless your doctor says they are safe for you. Diet pills may make heart problems worse.  Keep all follow-up visits as told by your doctor. This is important. Contact a doctor if:  You notice a change in the speed, rhythm, or strength of your heartbeat.  You are taking a blood-thinning medicine and you get more bruising.  You get tired more easily when you move or exercise.  You have a sudden change in weight. Get help right away if:  You have pain in your chest or your belly (abdomen).  You have trouble breathing.  You have side effects of blood thinners, such as blood in your vomit, poop (stool), or pee (urine), or bleeding that cannot stop.  You have any signs of a stroke. "BE FAST" is an easy way to remember the main warning signs: ? B - Balance. Signs are dizziness, sudden trouble walking, or loss of balance. ? E - Eyes. Signs are trouble seeing or a change in how you see. ? F - Face. Signs are sudden weakness or loss of feeling in the face, or the face or eyelid drooping on one side. ? A - Arms. Signs are weakness or loss of feeling in an arm. This happens suddenly and usually on one side of the body. ? S - Speech. Signs are sudden trouble speaking, slurred speech, or trouble understanding what people say. ? T - Time. Time to call emergency services. Write down what time symptoms started.  You have other signs of a stroke, such as: ? A sudden, very bad headache with no known cause. ? Feeling like you may vomit (nausea). ? Vomiting. ? A seizure. These symptoms may be an emergency. Do not wait to see if the symptoms will go away. Get medical help right away. Call your local emergency services (911 in the [place] Do not drive yourself to the hospital.   Summary  Atrial fibrillation is a type of heartbeat that is irregular or fast.  You are at  higher risk of this condition if you smoke, are older, have diabetes, or are overweight.  Follow your doctor's instructions about medicines, diet, exercise, and follow-up visits.  Get help right away if you have signs or symptoms of a stroke.  Get help right away if you cannot catch your breath, or you have chest pain or discomfort. This information is not intended to replace advice given to you by your health care provider. Make sure you discuss any questions you have with your health care provider. Document Revised: 12/18/2018 Document Reviewed: 12/18/2018 Elsevier Patient Education  2021 [employer].

## 2020-10-15 NOTE — Patient Instructions (Signed)
Start flecainide 50mg  twice a day  Stop cardizem 90mg    Start Cardizem (diltiazem) 120mg  twice a day

## 2020-10-15 NOTE — Progress Notes (Signed)
Establish care

## 2020-10-18 ENCOUNTER — Ambulatory Visit (HOSPITAL_COMMUNITY)
Admission: RE | Admit: 2020-10-18 | Discharge: 2020-10-18 | Disposition: A | Discharge status: Home / Self Care | Payer: BC Managed Care – PPO | Source: Ambulatory Visit | Attending: Physician Assistant | Admitting: Physician Assistant

## 2020-10-18 ENCOUNTER — Other Ambulatory Visit: Payer: Self-pay

## 2020-10-18 ENCOUNTER — Encounter (HOSPITAL_COMMUNITY): Payer: Self-pay | Admitting: *Deleted

## 2020-10-18 VITALS — BP 118/78 | HR 138

## 2020-10-18 DIAGNOSIS — I4891 Unspecified atrial fibrillation: Secondary | ICD-10-CM | POA: Insufficient documentation

## 2020-10-18 DIAGNOSIS — I4892 Unspecified atrial flutter: Secondary | ICD-10-CM | POA: Diagnosis not present

## 2020-10-18 DIAGNOSIS — E119 Type 2 diabetes mellitus without complications: Secondary | ICD-10-CM | POA: Diagnosis not present

## 2020-10-18 DIAGNOSIS — Z20822 Contact with and (suspected) exposure to covid-19: Secondary | ICD-10-CM | POA: Insufficient documentation

## 2020-10-18 DIAGNOSIS — Z01818 Encounter for other preprocedural examination: Secondary | ICD-10-CM | POA: Insufficient documentation

## 2020-10-18 DIAGNOSIS — I483 Typical atrial flutter: Secondary | ICD-10-CM

## 2020-10-18 LAB — BASIC METABOLIC PANEL
Anion gap: 8 (ref 5–15)
BUN: 8 mg/dL (ref 6–20)
CO2: 23 mmol/L (ref 22–32)
Calcium: 8.9 mg/dL (ref 8.9–10.3)
Chloride: 106 mmol/L (ref 98–111)
Creatinine, Ser: 1.08 mg/dL (ref 0.61–1.24)
GFR, Estimated: >60 mL/min (ref >60)
Glucose, Bld: 90 mg/dL (ref 70–99)
Potassium: 4.2 mmol/L (ref 3.5–5.1)
Sodium: 137 mmol/L (ref 135–145)

## 2020-10-18 LAB — CBC
HCT: 42.1 % (ref 39.0–52.0)
Hemoglobin: 13.4 g/dL (ref 13.0–17.0)
MCH: 21.8 pg — ABNORMAL LOW (ref 26.0–34.0)
MCHC: 31.8 g/dL (ref 30.0–36.0)
MCV: 68.6 fL — ABNORMAL LOW (ref 80.0–100.0)
Platelets: 302 10*3/uL (ref 150–400)
RBC: 6.14 MIL/uL — ABNORMAL HIGH (ref 4.22–5.81)
RDW: 15.9 % — ABNORMAL HIGH (ref 11.5–15.5)
WBC: 5.3 10*3/uL (ref 4.0–10.5)
nRBC: 0 % (ref 0.0–0.2)

## 2020-10-18 LAB — MICROALBUMIN / CREATININE URINE RATIO
Creatinine, Urine: 314.7 mg/dL
Microalb/Creat Ratio: 83 mg/g creat — ABNORMAL HIGH (ref 0–29)
Microalbumin, Urine: 261 ug/mL

## 2020-10-18 MED ORDER — FLECAINIDE ACETATE 50 MG PO TABS: 100.0000 mg | ORAL_TABLET | Freq: Two times a day (BID) | ORAL | 3 refills | Status: DC

## 2020-10-18 NOTE — Progress Notes (Signed)
Patient returns for ECG after starting flecainide. ECG shows atrial flutter with variable block HR 138, QRS 76, QTc 424. His HR has improved. Will increase flecainide to 100 mg BID and schedule DCCV. Check bmet/CBC. F/u in AF clinic post DCCV.

## 2020-10-18 NOTE — Patient Instructions (Signed)
Cardioversion scheduled for Friday, April 22nd  - Arrive at the [facility] and go to admitting at [facility] not eat or drink anything after midnight the night prior to your procedure.  - Take all your morning medication (except diabetic medications) with a sip of water prior to arrival.  - You will not be able to drive home after your procedure.  - Do NOT miss any doses of your blood thinner - if you should miss a dose please notify our office immediately.  - If you feel as if you go back into normal rhythm prior to scheduled cardioversion, please notify our office immediately. If your procedure is canceled in the cardioversion suite you will be charged a cancellation fee.     Increase flecainide to 100mg  twice a day

## 2020-10-18 NOTE — Progress Notes (Signed)
You have protein spilling over into the urine. This could mean that diabetes is affecting the kidneys. Good diabetes control will help. Patient encouraged to have rechecked at next visit.   Diabetes discussed during office visit.

## 2020-10-18 NOTE — H&P (View-Only) (Signed)
Patient returns for ECG after starting flecainide. ECG shows atrial flutter with variable block HR 138, QRS 76, QTc 424. His HR has improved. Will increase flecainide to 100 mg BID and schedule DCCV. Check bmet/CBC. F/u in AF clinic post DCCV.  

## 2020-10-19 ENCOUNTER — Encounter (HOSPITAL_COMMUNITY): Payer: Self-pay | Admitting: *Deleted

## 2020-10-20 ENCOUNTER — Telehealth: Payer: Self-pay | Admitting: Family

## 2020-10-20 NOTE — Telephone Encounter (Signed)
Pt called stating he misunderstood how to take the medication instructions given by PCP on his last appt. As a result he's been going to the restroom a lot and states his kidneys are hurting. Pt requested to speak w/Pcp and was told at the moment she is w/patients in Clinic. Pt stated he was not going to work and was told to go to Urgent Care if it worsened. Pt stated he's not going to UC and that he was coming directly to the clinic to speak w/PCP and hung up. Please advise and thank you.

## 2020-10-20 NOTE — Telephone Encounter (Signed)
Pt came in the office stating he was having kidney pain last night. Pt stated he took one pill of dapagliflozin propanediol (FARXIGA) 5 MG TABS tablet  and sitaGLIPtin (JANUVIA) 25 MG tablet in the morning and then also took one of each pill in the evening. He was wondering if that could have been the cause of his kidney pain. Please call pt and advise.

## 2020-10-21 NOTE — Telephone Encounter (Signed)
Spoke w/pt and appt scheduled with Franky Macho

## 2020-10-21 NOTE — Telephone Encounter (Signed)
Diabetic plan of care is as follows: - Dapagliflozin Propanediol (FARXIGA) 5 MG TABS tablet; Take 1 tablet (5 mg total) by mouth daily before breakfast.   - SitaGLIPtin (JANUVIA) 25 MG tablet; Take 1 tablet (25 mg total) by mouth daily.  Dapagliflozin Propanediol (FARXIGA) may cause increased urination, this was discussed with patient during office visit.   Kidney pain is probable related to patient taking more than the prescribed dose of the medication.   Patient may come into the office for a nurse only visit for urinalysis to see if he has possible urinary tract infection.   Please schedule patient an appointment (while he is on the phone) with Franky Macho, CPP at the [facility] within the next 2 weeks for a diabetes checkup. Also, encouraged to keep appointment with me scheduled 11/29/2020.  Counseled to go to Emergency Department or Urgent Care if symptoms worse or new problems develop.

## 2020-10-27 ENCOUNTER — Other Ambulatory Visit (HOSPITAL_COMMUNITY): Payer: BC Managed Care – PPO

## 2020-10-28 ENCOUNTER — Other Ambulatory Visit (HOSPITAL_COMMUNITY)
Admission: RE | Admit: 2020-10-28 | Discharge: 2020-10-28 | Disposition: A | Discharge status: Home / Self Care | Payer: BC Managed Care – PPO | Source: Ambulatory Visit | Attending: Cardiovascular Disease | Admitting: Cardiovascular Disease

## 2020-10-28 DIAGNOSIS — Z20822 Contact with and (suspected) exposure to covid-19: Secondary | ICD-10-CM | POA: Insufficient documentation

## 2020-10-28 DIAGNOSIS — I4891 Unspecified atrial fibrillation: Secondary | ICD-10-CM | POA: Diagnosis not present

## 2020-10-28 DIAGNOSIS — Z01812 Encounter for preprocedural laboratory examination: Secondary | ICD-10-CM | POA: Insufficient documentation

## 2020-10-28 LAB — SARS CORONAVIRUS 2 (TAT 6-24 HRS): SARS Coronavirus 2: NEGATIVE

## 2020-10-29 ENCOUNTER — Ambulatory Visit (HOSPITAL_COMMUNITY): Payer: BC Managed Care – PPO | Admitting: Anesthesiology

## 2020-10-29 ENCOUNTER — Other Ambulatory Visit (HOSPITAL_COMMUNITY): Payer: Self-pay | Admitting: *Deleted

## 2020-10-29 ENCOUNTER — Ambulatory Visit (HOSPITAL_COMMUNITY)
Admission: RE | Admit: 2020-10-29 | Discharge: 2020-10-29 | Disposition: A | Discharge status: Home / Self Care | Payer: BC Managed Care – PPO | Attending: Cardiovascular Disease | Admitting: Cardiovascular Disease

## 2020-10-29 ENCOUNTER — Encounter (HOSPITAL_COMMUNITY): Payer: Self-pay | Admitting: Cardiovascular Disease

## 2020-10-29 ENCOUNTER — Encounter (HOSPITAL_COMMUNITY)
Admission: RE | Disposition: A | Discharge status: Home / Self Care | Payer: Self-pay | Source: Home / Self Care | Attending: Cardiovascular Disease

## 2020-10-29 ENCOUNTER — Other Ambulatory Visit: Payer: Self-pay

## 2020-10-29 DIAGNOSIS — I4819 Other persistent atrial fibrillation: Secondary | ICD-10-CM | POA: Diagnosis not present

## 2020-10-29 DIAGNOSIS — Z20822 Contact with and (suspected) exposure to covid-19: Secondary | ICD-10-CM | POA: Insufficient documentation

## 2020-10-29 DIAGNOSIS — I4891 Unspecified atrial fibrillation: Secondary | ICD-10-CM | POA: Insufficient documentation

## 2020-10-29 DIAGNOSIS — D72829 Elevated white blood cell count, unspecified: Secondary | ICD-10-CM | POA: Diagnosis not present

## 2020-10-29 DIAGNOSIS — I5032 Chronic diastolic (congestive) heart failure: Secondary | ICD-10-CM | POA: Diagnosis not present

## 2020-10-29 DIAGNOSIS — I4892 Unspecified atrial flutter: Secondary | ICD-10-CM | POA: Diagnosis not present

## 2020-10-29 HISTORY — PX: CARDIOVERSION: SHX1299

## 2020-10-29 LAB — GLUCOSE, CAPILLARY: Glucose-Capillary: 88 mg/dL (ref 70–99)

## 2020-10-29 SURGERY — CARDIOVERSION
Anesthesia: General

## 2020-10-29 MED ORDER — LIDOCAINE 2% (20 MG/ML) 5 ML SYRINGE
INTRAMUSCULAR | Status: DC | PRN
  Administered 2020-10-29: 60 mg via INTRAVENOUS

## 2020-10-29 MED ORDER — FLECAINIDE ACETATE 100 MG PO TABS: 100.0000 mg | ORAL_TABLET | Freq: Two times a day (BID) | ORAL | 3 refills | Status: DC

## 2020-10-29 MED ORDER — PROPOFOL 10 MG/ML IV BOLUS
INTRAVENOUS | Status: DC | PRN
  Administered 2020-10-29: 100 mg via INTRAVENOUS

## 2020-10-29 NOTE — Transfer of Care (Signed)
Immediate Anesthesia Transfer of Care Note  Patient: [name] [name]  Procedure(s) Performed: CARDIOVERSION (N/A )  Patient Location: Endoscopy Unit  Anesthesia Type:General  Level of Consciousness: awake, alert  and oriented  Airway & Oxygen Therapy: Patient Spontanous Breathing  Post-op Assessment: Report given to RN and Post -op Vital signs reviewed and stable  Post vital signs: Reviewed and stable  Last Vitals:  Vitals Value Taken Time  BP    Temp    Pulse    Resp    SpO2      Last Pain:  Vitals:   10/29/20 0900  TempSrc: Temporal  PainSc: 0-No pain         Complications: No complications documented.

## 2020-10-29 NOTE — Interval H&P Note (Signed)
History and Physical Interval Note:  10/29/2020 9:42 AM  [name] [name]  has presented today for surgery, with the diagnosis of AFIB.  The various methods of treatment have been discussed with the patient and family. After consideration of risks, benefits and other options for treatment, the patient has consented to  Procedure(s): CARDIOVERSION (N/A) as a surgical intervention.  The patient's history has been reviewed, patient examined, no change in status, stable for surgery.  I have reviewed the patient's chart and labs.  Questions were answered to the patient's satisfaction.     [name]

## 2020-10-29 NOTE — Discharge Instructions (Signed)
Electrical Cardioversion Electrical cardioversion is the delivery of a jolt of electricity to restore a normal rhythm to the heart. A rhythm that is too fast or is not regular keeps the heart from pumping well. In this procedure, sticky patches or metal paddles are placed on the chest to deliver electricity to the heart from a device. This procedure may be done in an emergency if:  There is low or no blood pressure as a result of the heart rhythm.  Normal rhythm must be restored as fast as possible to protect the brain and heart from further damage.  It may save a life. This may also be a scheduled procedure for irregular or fast heart rhythms that are not immediately life-threatening. Tell a health care provider about:  Any allergies you have.  All medicines you are taking, including vitamins, herbs, eye drops, creams, and over-the-counter medicines.  Any problems you or family members have had with anesthetic medicines.  Any blood disorders you have.  Any surgeries you have had.  Any medical conditions you have.  Whether you are pregnant or may be pregnant. What are the risks? Generally, this is a safe procedure. However, problems may occur, including:  Allergic reactions to medicines.  A blood clot that breaks free and travels to other parts of your body.  The possible return of an abnormal heart rhythm within hours or days after the procedure.  Your heart stopping (cardiac arrest). This is rare. What happens before the procedure? Medicines  Your health care provider may have you start taking: ? Blood-thinning medicines (anticoagulants) so your blood does not clot as easily. ? Medicines to help stabilize your heart rate and rhythm.  Ask your health care provider about: ? Changing or stopping your regular medicines. This is especially important if you are taking diabetes medicines or blood thinners. ? Taking medicines such as aspirin and ibuprofen. These medicines can  thin your blood. Do not take these medicines unless your health care provider tells you to take them. ? Taking over-the-counter medicines, vitamins, herbs, and supplements. General instructions  Follow instructions from your health care provider about eating or drinking restrictions.  Plan to have someone take you home from the hospital or clinic.  If you will be going home right after the procedure, plan to have someone with you for 24 hours.  Ask your health care provider what steps will be taken to help prevent infection. These may include washing your skin with a germ-killing soap. What happens during the procedure?  An IV will be inserted into one of your veins.  Sticky patches (electrodes) or metal paddles may be placed on your chest.  You will be given a medicine to help you relax (sedative).  An electrical shock will be delivered. The procedure may vary among health care providers and hospitals.   What can I expect after the procedure?  Your blood pressure, heart rate, breathing rate, and blood oxygen level will be monitored until you leave the hospital or clinic.  Your heart rhythm will be watched to make sure it does not change.  You may have some redness on the skin where the shocks were given. Follow these instructions at home:  Do not drive for 24 hours if you were given a sedative during your procedure.  Take over-the-counter and prescription medicines only as told by your health care provider.  Ask your health care provider how to check your pulse. Check it often.  Rest for 48 hours after the procedure   or as told by your health care provider.  Avoid or limit your caffeine use as told by your health care provider.  Keep all follow-up visits as told by your health care provider. This is important. Contact a health care provider if:  You feel like your heart is beating too quickly or your pulse is not regular.  You have a serious muscle cramp that does not go  away. Get help right away if:  You have discomfort in your chest.  You are dizzy or you feel faint.  You have trouble breathing or you are short of breath.  Your speech is slurred.  You have trouble moving an arm or leg on one side of your body.  Your fingers or toes turn cold or blue. Summary  Electrical cardioversion is the delivery of a jolt of electricity to restore a normal rhythm to the heart.  This procedure may be done right away in an emergency or may be a scheduled procedure if the condition is not an emergency.  Generally, this is a safe procedure.  After the procedure, check your pulse often as told by your health care provider. This information is not intended to replace advice given to you by your health care provider. Make sure you discuss any questions you have with your health care provider. Document Revised: 01/27/2019 Document Reviewed: 01/27/2019 Elsevier Patient Education  2021 Elsevier Inc.  

## 2020-10-29 NOTE — CV Procedure (Signed)
    Cardioversion Note  [name] [name] [id number] 1965-07-24  Procedure: DC Cardioversion Indications:  Atrial fib / flutter   Procedure Details Consent: Obtained Time Out: Verified patient identification, verified procedure, site/side was marked, verified correct patient position, special equipment/implants available, Radiology Safety Procedures followed,  medications/allergies/relevent history reviewed, required imaging and test results available.  Performed  The patient has been on adequate anticoagulation.  The patient received IV Lidocaine 60 mg followed by Proporol 100 mg IV  for sedation.  Synchronous cardioversion was performed at  200  joules.  The cardioversion was successful     Complications: No apparent complications Patient did tolerate procedure well.   Vesta Mixer, Montez Hageman., MD, [facility] 10/29/2020, 10:16 AM

## 2020-10-29 NOTE — Anesthesia Preprocedure Evaluation (Signed)
Anesthesia Evaluation  Patient identified by MRN, date of birth, ID band Patient awake    Reviewed: Allergy & Precautions, H&P , NPO status , Patient's Chart, lab work & pertinent test results  Airway Mallampati: II   Neck ROM: full    Dental   Pulmonary former smoker,    breath sounds clear to auscultation       Cardiovascular +CHF  + dysrhythmias Atrial Fibrillation  Rhythm:irregular Rate:Normal     Neuro/Psych    GI/Hepatic   Endo/Other    Renal/GU      Musculoskeletal   Abdominal   Peds  Hematology   Anesthesia Other Findings   Reproductive/Obstetrics                             Anesthesia Physical Anesthesia Plan  ASA: III  Anesthesia Plan: General   Post-op Pain Management:    Induction: Intravenous  PONV Risk Score and Plan: 2 and Propofol infusion and Treatment may vary due to age or medical condition  Airway Management Planned: Mask  Additional Equipment:   Intra-op Plan:   Post-operative Plan:   Informed Consent: I have reviewed the patients History and Physical, chart, labs and discussed the procedure including the risks, benefits and alternatives for the proposed anesthesia with the patient or authorized representative who has indicated his/her understanding and acceptance.     Dental advisory given  Plan Discussed with: CRNA, Anesthesiologist and Surgeon  Anesthesia Plan Comments:         Anesthesia Quick Evaluation

## 2020-10-29 NOTE — Anesthesia Postprocedure Evaluation (Signed)
Anesthesia Post Note  Patient: [name] [name]  Procedure(s) Performed: CARDIOVERSION (N/A )     Patient location during evaluation: Endoscopy Anesthesia Type: General Level of consciousness: awake and alert Pain management: pain level controlled Vital Signs Assessment: post-procedure vital signs reviewed and stable Respiratory status: spontaneous breathing, nonlabored ventilation, respiratory function stable and patient connected to nasal cannula oxygen Cardiovascular status: blood pressure returned to baseline and stable Postop Assessment: no apparent nausea or vomiting Anesthetic complications: no   No complications documented.  Last Vitals:  Vitals:   10/29/20 1032 10/29/20 1042  BP: 120/80 118/83  Pulse: 84 83  Resp: 15 13  Temp:    SpO2: 97% 99%    Last Pain:  Vitals:   10/29/20 1042  TempSrc:   PainSc: 0-No pain                 Neveen Daponte S

## 2020-10-29 NOTE — Anesthesia Procedure Notes (Signed)
Procedure Name: General with mask airway Performed by: Ashon Rosenberg B, CRNA Pre-anesthesia Checklist: Patient identified, Emergency Drugs available, Suction available, Patient being monitored and Timeout performed Patient Re-evaluated:Patient Re-evaluated prior to induction Oxygen Delivery Method: Ambu bag Preoxygenation: Pre-oxygenation with 100% oxygen Induction Type: IV induction Ventilation: Mask ventilation without difficulty Placement Confirmation: positive ETCO2 Dental Injury: Teeth and Oropharynx as per pre-operative assessment        

## 2020-11-03 ENCOUNTER — Other Ambulatory Visit: Payer: Self-pay

## 2020-11-03 ENCOUNTER — Ambulatory Visit
Admission: EM | Admit: 2020-11-03 | Discharge: 2020-11-03 | Disposition: A | Discharge status: Home / Self Care | Payer: BC Managed Care – PPO | Attending: Internal Medicine | Admitting: Internal Medicine

## 2020-11-03 ENCOUNTER — Encounter: Payer: Self-pay | Admitting: Emergency Medicine

## 2020-11-03 DIAGNOSIS — L729 Follicular cyst of the skin and subcutaneous tissue, unspecified: Secondary | ICD-10-CM

## 2020-11-03 NOTE — Discharge Instructions (Addendum)
Daily wound dressing with antibiotic ointment. Apply pressure wound dressing. If bleeding is persistent please return to urgent care. Please return to urgent care if symptoms worsen. If you notice any redness,swelling or fever-please return to the urgent care for further evaluation

## 2020-11-03 NOTE — ED Provider Notes (Signed)
[facility]    CSN: [id number] Arrival date & time: 11/03/20  1240      History   Chief Complaint Chief Complaint  Patient presents with  . Cyst    HPI [name] [name] is a 55 y.o. male comes urgent care with complaint of painful cyst on the left side of the face of 2 weeks duration.  Swelling is in the jawline area.  It is painful, throbbing and aggravated by palpation.  Patient has not tried any over-the-counter remedies.  No redness or discharge.  Patient has a history of cyst in the beard and had incision and drainage at that time. No fever or chills.  Patient is on Eliquis.   HPI  Past Medical History:  Diagnosis Date  . ARF (acute renal failure) (HCC)   . Atrial flutter (HCC)   . Community acquired bacterial pneumonia   . Diastolic CHF, chronic (HCC)   . Leukocytosis   . N&V (nausea and vomiting)   . SOB (shortness of breath)   . Substance abuse (HCC)    Prior hx of cocaine (quit 5 years ago)/EtOH (clean for 14 months) as of 10/2011    Patient Active Problem List   Diagnosis Date Noted  . Persistent atrial fibrillation (HCC)   . Paroxysmal atrial fibrillation (HCC) 05/29/2019  . Substance abuse (HCC)   . Diastolic CHF, chronic   . Diastolic CHF, chronic (HCC) 10/24/2011  . Atrial flutter (HCC) 10/22/2011  . Community acquired bacterial pneumonia 10/21/2011  . SOB (shortness of breath) 10/21/2011  . N&V (nausea and vomiting) 10/21/2011  . ARF (acute renal failure) (HCC) 10/21/2011  . Tobacco abuse 10/21/2011  . Leukocytosis 10/21/2011  . Fever 10/21/2011    Past Surgical History:  Procedure Laterality Date  . CARDIOVERSION N/A 10/29/2020   Procedure: CARDIOVERSION;  Surgeon: Elease Hashimoto Deloris Ping, MD;  Location: [facility] ENDOSCOPY;  Service: Cardiovascular;  Laterality: N/A;       Home Medications    Prior to Admission medications   Medication Sig Start Date End Date Taking? Authorizing Provider  acetaminophen (TYLENOL) 500 MG tablet Take 1,000 mg  by mouth every 6 (six) hours as needed for mild pain, moderate pain or headache.    [provider]  apixaban (ELIQUIS) 5 MG TABS tablet Take 1 tablet (5 mg total) by mouth 2 (two) times daily. 10/07/20 11/06/20  Jeannie Fend, PA-C  cetirizine (ZYRTEC) 10 MG tablet Take 10 mg by mouth daily as needed for allergies.    [provider]  dapagliflozin propanediol (FARXIGA) 5 MG TABS tablet Take 1 tablet (5 mg total) by mouth daily before breakfast. 10/15/20   Rema Fendt, NP  diltiazem (CARDIZEM CD) 120 MG 24 hr capsule Take 1 capsule (120 mg total) by mouth 2 (two) times daily. 10/15/20   Fenton, Clint R, PA  esomeprazole (NEXIUM) 20 MG capsule Take 20 mg by mouth daily as needed (acid reflux).    [provider]  flecainide (TAMBOCOR) 100 MG tablet Take 1 tablet (100 mg total) by mouth 2 (two) times daily. 10/29/20   Fenton, Clint R, PA  sitaGLIPtin (JANUVIA) 25 MG tablet Take 1 tablet (25 mg total) by mouth daily. 10/15/20   Rema Fendt, NP    Family History Family History  Problem Relation Age of Onset  . Heart disease Neg Hx     Social History Social History   Tobacco Use  . Smoking status: Former Games developer  . Smokeless tobacco: Never Used  .  Tobacco comment: Quit April 2013  Vaping Use  . Vaping Use: Never used  Substance Use Topics  . Alcohol use: Not Currently  . Drug use: Not Currently     Allergies   Patient has no known allergies.   Review of Systems Review of Systems  Musculoskeletal: Negative.   Skin: Negative for color change, pallor, rash and wound.  Neurological: Negative.      Physical Exam Triage Vital Signs ED Triage Vitals  Enc Vitals Group     BP 11/03/20 1441 (!) 142/81     Pulse Rate 11/03/20 1441 75     Resp 11/03/20 1441 18     Temp 11/03/20 1441 97.6 F (36.4 C)     Temp Source 11/03/20 1441 Oral     SpO2 11/03/20 1441 95 %     Weight --      Height --      Head Circumference --      Peak Flow --      Pain  Score 11/03/20 1442 3     Pain Loc --      Pain Edu? --      Excl. in GC? --    No data found.  Updated Vital Signs BP (!) 142/81 (BP Location: Right Arm)   Pulse 75   Temp 97.6 F (36.4 C) (Oral)   Resp 18   SpO2 95%   Visual Acuity Right Eye Distance:   Left Eye Distance:   Bilateral Distance:    Right Eye Near:   Left Eye Near:    Bilateral Near:     Physical Exam Vitals and nursing note reviewed.  Constitutional:      General: He is not in acute distress.    Appearance: He is not ill-appearing.  Cardiovascular:     Rate and Rhythm: Normal rate and regular rhythm.     Pulses: Normal pulses.     Heart sounds: Normal heart sounds.  Skin:    Comments: 1 inch cystic lesion on the left area of the face.  Cyst is mobile and not attached to the underlying skin. No erythema. No fluctuance   Neurological:     Mental Status: He is alert.      UC Treatments / Results  Labs (all labs ordered are listed, but only abnormal results are displayed) Labs Reviewed - No data to display  EKG   Radiology No results found.  Procedures Incision and Drainage  Date/Time: 11/03/2020 4:26 PM Performed by: Merrilee Jansky, MD Authorized by: Merrilee Jansky, MD   Consent:    Consent obtained:  Verbal   Consent given by:  Patient   Risks discussed:  Bleeding   Alternatives discussed:  No treatment Universal protocol:    Patient identity confirmed:  Verbally with patient Location:    Type:  Cyst   Location:  Head   Head location:  Face Pre-procedure details:    Skin preparation:  Povidone-iodine Sedation:    Sedation type:  None Anesthesia:    Anesthesia method:  Local infiltration   Local anesthetic:  Bupivacaine 0.25% w/o epi Procedure type:    Complexity:  Simple Procedure details:    Incision types:  Stab incision   Incision depth:  Subcutaneous   Drainage:  Purulent and bloody   Drainage amount:  Moderate   Packing materials:  None Post-procedure  details:    Procedure completion:  Tolerated well, no immediate complications   (including critical care time)  Medications Ordered in  UC Medications - No data to display  Initial Impression / Assessment and Plan / UC Course  I have reviewed the triage vital signs and the nursing notes.  Pertinent labs & imaging results that were available during my care of the patient were reviewed by me and considered in my medical decision making (see chart for details).     1.  Follicular cyst : Incision and drainage completed  No indication for changeantibiotics Daily wound dressing changes  Pressure dressing to ensure hemostasis Please return to the urgent care if you have erythema, increased discharge, worsening pain or swelling    Final Clinical Impressions(s) / UC Diagnoses   Final diagnoses:  Follicular cyst of skin     Discharge Instructions     Daily wound dressing with antibiotic ointment. Apply pressure wound dressing. If bleeding is persistent please return to urgent care. Please return to urgent care if symptoms worsen. If you notice any redness,swelling or fever-please return to the urgent care for further evaluation    ED Prescriptions    None     PDMP not reviewed this encounter.   Merrilee Jansky, MD 11/03/20 1630

## 2020-11-03 NOTE — ED Triage Notes (Signed)
Pt here for cyst to left side of face x 2 weeks that has had some drainage at times; pt sts has had cardioversion x 2 recently

## 2020-11-05 ENCOUNTER — Encounter (HOSPITAL_COMMUNITY): Payer: Self-pay | Admitting: Physician Assistant

## 2020-11-05 ENCOUNTER — Ambulatory Visit (HOSPITAL_COMMUNITY)
Admission: RE | Admit: 2020-11-05 | Discharge: 2020-11-05 | Disposition: A | Discharge status: Home / Self Care | Payer: BC Managed Care – PPO | Source: Ambulatory Visit | Attending: Physician Assistant | Admitting: Physician Assistant

## 2020-11-05 ENCOUNTER — Other Ambulatory Visit: Payer: Self-pay

## 2020-11-05 VITALS — BP 136/70 | HR 76 | Ht 70.0 in | Wt 225.4 lb

## 2020-11-05 DIAGNOSIS — E669 Obesity, unspecified: Secondary | ICD-10-CM | POA: Diagnosis not present

## 2020-11-05 DIAGNOSIS — Z7182 Exercise counseling: Secondary | ICD-10-CM | POA: Insufficient documentation

## 2020-11-05 DIAGNOSIS — Z8616 Personal history of COVID-19: Secondary | ICD-10-CM | POA: Insufficient documentation

## 2020-11-05 DIAGNOSIS — G4733 Obstructive sleep apnea (adult) (pediatric): Secondary | ICD-10-CM | POA: Diagnosis not present

## 2020-11-05 DIAGNOSIS — Z7901 Long term (current) use of anticoagulants: Secondary | ICD-10-CM | POA: Diagnosis not present

## 2020-11-05 DIAGNOSIS — E119 Type 2 diabetes mellitus without complications: Secondary | ICD-10-CM | POA: Diagnosis not present

## 2020-11-05 DIAGNOSIS — Z7984 Long term (current) use of oral hypoglycemic drugs: Secondary | ICD-10-CM | POA: Diagnosis not present

## 2020-11-05 DIAGNOSIS — Z6832 Body mass index (BMI) 32.0-32.9, adult: Secondary | ICD-10-CM | POA: Insufficient documentation

## 2020-11-05 DIAGNOSIS — Z79899 Other long term (current) drug therapy: Secondary | ICD-10-CM | POA: Insufficient documentation

## 2020-11-05 DIAGNOSIS — I110 Hypertensive heart disease with heart failure: Secondary | ICD-10-CM | POA: Insufficient documentation

## 2020-11-05 DIAGNOSIS — Z87891 Personal history of nicotine dependence: Secondary | ICD-10-CM | POA: Insufficient documentation

## 2020-11-05 DIAGNOSIS — I5032 Chronic diastolic (congestive) heart failure: Secondary | ICD-10-CM | POA: Diagnosis not present

## 2020-11-05 DIAGNOSIS — I480 Paroxysmal atrial fibrillation: Secondary | ICD-10-CM | POA: Diagnosis not present

## 2020-11-05 NOTE — Progress Notes (Signed)
Primary Care Physician: Rema Fendt, NP Primary Cardiologist: Dr Delton See (remotely) Primary Electrophysiologist: none Referring Physician: D[facility]   [name] [name] is a 55 y.o. male with a history of paroxysmal afib, atrial flutter, diastolic CHF, OSA, DM, and tobacco use. He was initially diagnosed with afib in 2013 in the setting of CAP. He spontaneously converted back to SR. Unfortunately, patient tested positive for COVID-19 and was found to be in atrial flutter and later in afib at the ER. He converted to SR after diltiazem drip. He was asymptomatic during the episode. He denies any alcohol use.   Patient has had multiple ED visits for various reasons. He presented to the ED on 10/07/20 for cough and sore throat and was incidentally found to be in rapid atrial flutter. He underwent DCCV at that time. He does admit to taking psuedophedrine before the onset of his symptoms. He was back in rapid flutter on follow up and started on flecainide.   On follow up today, patient is s/p DCCV 10/29/20. He reports he feels well with more energy and no heart racing. He denies any bleeding issues on anticoagulation.     Today, he denies symptoms of palpitations, chest pain, shortness of breath, orthopnea, PND, lower extremity edema, dizziness, bleeding, or neurologic sequela. The patient is tolerating medications without difficulties and is otherwise without complaint today.    Atrial Fibrillation Risk Factors:  he does have symptoms or diagnosis of sleep apnea. He is not on CPAP therapy. he does have a history of alcohol use. Does not drink currently.   he has a BMI of Body mass index is 32.34 kg/m.Marland Kitchen Filed Weights   11/05/20 1015  Weight: 102.2 kg    Family History  Problem Relation Age of Onset  . Heart disease Neg Hx      Atrial Fibrillation Management history:  Previous antiarrhythmic drugs: flecainide  Previous cardioversions: 10/29/20 Previous ablations:  none CHADS2VASC score: 1 Anticoagulation history: Eliquis   Past Medical History:  Diagnosis Date  . ARF (acute renal failure) (HCC)   . Atrial flutter (HCC)   . Community acquired bacterial pneumonia   . Diastolic CHF, chronic (HCC)   . Leukocytosis   . N&V (nausea and vomiting)   . SOB (shortness of breath)   . Substance abuse (HCC)    Prior hx of cocaine (quit 5 years ago)/EtOH (clean for 14 months) as of 10/2011   Past Surgical History:  Procedure Laterality Date  . CARDIOVERSION N/A 10/29/2020   Procedure: CARDIOVERSION;  Surgeon: Elease Hashimoto Deloris Ping, MD;  Location: [facility] ENDOSCOPY;  Service: Cardiovascular;  Laterality: N/A;    Current Outpatient Medications  Medication Sig Dispense Refill  . apixaban (ELIQUIS) 5 MG TABS tablet Take 1 tablet (5 mg total) by mouth 2 (two) times daily. 60 tablet 0  . dapagliflozin propanediol (FARXIGA) 5 MG TABS tablet Take 1 tablet (5 mg total) by mouth daily before breakfast. 30 tablet 0  . diltiazem (CARDIZEM CD) 120 MG 24 hr capsule Take 1 capsule (120 mg total) by mouth 2 (two) times daily. 60 capsule 3  . flecainide (TAMBOCOR) 100 MG tablet Take 1 tablet (100 mg total) by mouth 2 (two) times daily. 60 tablet 3  . sitaGLIPtin (JANUVIA) 25 MG tablet Take 1 tablet (25 mg total) by mouth daily. 30 tablet 0  . acetaminophen (TYLENOL) 500 MG tablet Take 1,000 mg by mouth every 6 (six) hours as needed for mild pain, moderate pain or headache.    Marland Kitchen  cetirizine (ZYRTEC) 10 MG tablet Take 10 mg by mouth daily as needed for allergies.    Marland Kitchen esomeprazole (NEXIUM) 20 MG capsule Take 20 mg by mouth daily as needed (acid reflux). (Patient not taking: Reported on 11/05/2020)     No current facility-administered medications for this encounter.    No Known Allergies  Social History   Socioeconomic History  . Marital status: Married    Spouse name: Not on file  . Number of children: Not on file  . Years of education: Not on file  . Highest education level:  Not on file  Occupational History  . Not on file  Tobacco Use  . Smoking status: Former Games developer  . Smokeless tobacco: Never Used  . Tobacco comment: Quit April 2013  Vaping Use  . Vaping Use: Never used  Substance and Sexual Activity  . Alcohol use: Not Currently  . Drug use: Not Currently  . Sexual activity: Not on file  Other Topics Concern  . Not on file  Social History Narrative  . Not on file   Social Determinants of Health   Financial Resource Strain: Not on file  Food Insecurity: Not on file  Transportation Needs: Not on file  Physical Activity: Not on file  Stress: Not on file  Social Connections: Not on file  Intimate Partner Violence: Not on file     ROS- All systems are reviewed and negative except as per the HPI above.  Physical Exam: Vitals:   11/05/20 1015  BP: 136/70  Pulse: 76  Weight: 102.2 kg  Height: 5\' 10"  (1.778 m)    GEN- The patient is a well appearing obese male, alert and oriented x 3 today.   HEENT-head normocephalic, atraumatic, sclera clear, conjunctiva pink, hearing intact, trachea midline. Lungs- Clear to ausculation bilaterally, normal work of breathing Heart- Regular rate and rhythm, no murmurs, rubs or gallops  GI- soft, NT, ND, + BS Extremities- no clubbing, cyanosis, or edema MS- no significant deformity or atrophy Skin- no rash or lesion Psych- euthymic mood, full affect Neuro- strength and sensation are intact   Wt Readings from Last 3 Encounters:  11/05/20 102.2 kg  10/15/20 100.9 kg  10/15/20 100.2 kg    EKG today demonstrates  SR Vent. rate 76 BPM PR interval 152 ms QRS duration 96 ms QT/QTcB 376/423 ms  Echo 10/23/11 demonstrated  Left ventricle: The cavity size was normal. Wall thickness  was normal. Systolic function was normal. The estimated  ejection fraction was in the range of 55% to 60%. Features  are consistent with a pseudonormal left ventricular filling  pattern, with concomitant abnormal  relaxation and increased  filling pressure (grade 2 diastolic dysfunction).    Epic records are reviewed at length today  Assessment and Plan:  1. Paroxysmal atrial fibrillation/typical atrial flutter S/p DCCV on 10/29/20 Patient appears to be maintaining SR.  Continue flecainide 100 mg BID Continue diltiazem 120 mg BID Echocardiogram scheduled.  Continue Eliquis 5 mg BID  This patients CHA2DS2-VASc Score and unadjusted Ischemic Stroke Rate (% per year) is equal to 0.6 % stroke rate/year from a score of 1  Above score calculated as 1 point each if present [CHF, HTN, DM, Vascular=MI/PAD/Aortic Plaque, Age if 65-74, or Male] Above score calculated as 2 points each if present [Age > 75, or Stroke/TIA/TE]  2. Obesity Body mass index is 32.34 kg/m. Lifestyle modification was discussed and encouraged including regular physical activity and weight reduction.  3. Severe OSA He is not  compliant with CPAP therapy. He has not returned calls to CSW.   Follow up in the AF clinic in 3 months.    Jorja Loa PA-C [facility] [address] [place], [place] [address] [phone] 11/05/2020 10:21 AM

## 2020-11-12 ENCOUNTER — Other Ambulatory Visit: Payer: Self-pay

## 2020-11-12 ENCOUNTER — Ambulatory Visit: Payer: BC Managed Care – PPO | Attending: Family Medicine | Admitting: Pharmacist

## 2020-11-12 ENCOUNTER — Encounter: Payer: Self-pay | Admitting: Pharmacist

## 2020-11-12 DIAGNOSIS — E119 Type 2 diabetes mellitus without complications: Secondary | ICD-10-CM

## 2020-11-12 LAB — GLUCOSE, POCT (MANUAL RESULT ENTRY): POC Glucose: 122 mg/dl — AB (ref 70–99)

## 2020-11-12 MED ORDER — SITAGLIPTIN PHOSPHATE 25 MG PO TABS
25.0000 mg | ORAL_TABLET | Freq: Every day | ORAL | 2 refills | Status: DC
  Filled 2020-11-12: qty 30, 30d supply, fill #0

## 2020-11-12 MED ORDER — DAPAGLIFLOZIN PROPANEDIOL 5 MG PO TABS
5.0000 mg | ORAL_TABLET | Freq: Every day | ORAL | 2 refills | Status: DC
  Filled 2020-11-12: qty 30, 30d supply, fill #0

## 2020-11-12 NOTE — Progress Notes (Signed)
    S:    PCP: Amy   No chief complaint on file.  Patient arrives in good spirits.  Presents for diabetes evaluation, education, and management Patient was referred and last seen by Primary Care Provider on 10/15/2020.   Patient reports Diabetes was diagnosed this year.    Family/Social History:  FHx: no pertinent positives  Tobacco: former smoker  Alcohol: none currently   Insurance coverage/medication affordability: BCBS  Medication adherence reported .   Current diabetes medications include: Januvia 100 mg daily, Farxiga 5 mg daily  Patient denies hypoglycemic events.  Patient reported dietary habits:  - Reports diet high in salads, grilled proteins such as chicken, tuna, and other fish  - Drinks mostly water but has questions pertaining to soda intake   Patient-reported exercise habits:  -26mn to 1 hour daily in the gym. Of that, he spends 20 minutes or so on the treadmill   Patient denies nocturia (nighttime urination).  Patient reports neuropathy (nerve pain). Patient reports visual changes. Patient reports self foot exams.     O:  POCT: 122  Lab Results  Component Value Date   HGBA1C 9.8 (A) 10/15/2020   There were no vitals filed for this visit.  Lipid Panel  No results found for: CHOL, TRIG, HDL, CHOLHDL, VLDL, LDLCALC, LDLDIRECT  Home fasting blood sugars: 90s - 100s but no meter with him today.    Clinical Atherosclerotic Cardiovascular Disease (ASCVD): No  The ASCVD Risk score (Mikey BussingDC Jr., et al., 2013) failed to calculate for the following reasons:   Cannot find a previous HDL lab   Cannot find a previous total cholesterol lab    A/P: Diabetes newly dx currently uncontrolled. Patient is able to verbalize appropriate hypoglycemia management plan. Medication adherence appears appropriate. Today's clinic CBG is at goal. Reported home CBGs are at goal.  -Continued current regimen. -Extensively discussed pathophysiology of diabetes, recommended  lifestyle interventions, dietary effects on blood sugar control -Counseled on s/sx of and management of hypoglycemia -Next A1C anticipated 01/2021.  -Future CMP14+eGFR sent for eval of renal function S/p SGLT-2 inh initiation  ASCVD risk - primary prevention in patient with diabetes. Needs lipid panel. At least moderate statin therapy indicated. Will await results of lipid panel.  -Lipid, future   Written patient instructions provided.  Total time in face to face counseling 30 minutes.   Follow up PCP Clinic Visit 11/29/20.   LBenard Halsted PharmD, BPara March [phone]

## 2020-11-19 ENCOUNTER — Ambulatory Visit (HOSPITAL_COMMUNITY)
Admission: RE | Admit: 2020-11-19 | Discharge: 2020-11-19 | Disposition: A | Discharge status: Home / Self Care | Payer: BC Managed Care – PPO | Source: Ambulatory Visit | Attending: Physician Assistant | Admitting: Physician Assistant

## 2020-11-19 ENCOUNTER — Encounter (HOSPITAL_COMMUNITY): Payer: Self-pay | Admitting: *Deleted

## 2020-11-19 ENCOUNTER — Other Ambulatory Visit: Payer: Self-pay

## 2020-11-19 DIAGNOSIS — I4892 Unspecified atrial flutter: Secondary | ICD-10-CM | POA: Insufficient documentation

## 2020-11-19 DIAGNOSIS — I509 Heart failure, unspecified: Secondary | ICD-10-CM | POA: Insufficient documentation

## 2020-11-19 DIAGNOSIS — Z87891 Personal history of nicotine dependence: Secondary | ICD-10-CM | POA: Diagnosis not present

## 2020-11-19 DIAGNOSIS — I4819 Other persistent atrial fibrillation: Secondary | ICD-10-CM | POA: Insufficient documentation

## 2020-11-19 LAB — ECHOCARDIOGRAM COMPLETE
AR max vel: 2.96 cm2
AV Area VTI: 2.89 cm2
AV Area mean vel: 2.68 cm2
AV Mean grad: 3 mmHg
AV Peak grad: 4.7 mmHg
Ao pk vel: 1.08 m/s
Area-P 1/2: 3.31 cm2
MV VTI: 3.25 cm2
S' Lateral: 4.2 cm

## 2020-11-19 NOTE — Progress Notes (Signed)
  Echocardiogram 2D Echocardiogram has been performed.  Gerda Diss 11/19/2020, 9:44 AM

## 2020-11-26 ENCOUNTER — Other Ambulatory Visit (HOSPITAL_COMMUNITY): Payer: Self-pay

## 2020-11-26 MED ORDER — APIXABAN 5 MG PO TABS: 5.0000 mg | ORAL_TABLET | Freq: Two times a day (BID) | ORAL | 6 refills | Status: DC

## 2020-11-28 NOTE — Progress Notes (Signed)
Patient ID: [name] [name], male    DOB: 1966/02/26  MRN: [id number]  CC: Annual Physical Exam  Subjective: [name] [name] is a 55 y.o. male who presents for annual physical exam.   His concerns today include:   1. DIABETES TYPE 2 FOLLOW-UP: 10/15/2020: - Hemoglobin A1c not at goal today at 9.8%, goal < 7%. - Patient reports he is unable to take Metformin because it causes him to have blurry vision.  - Begin Sitagliptin and Dapagliflozin Propanediol as prescribed. - Follow-up with primary provider in 4 weeks or sooner if needed.   10/20/2020: From patient: Pt called stating he misunderstood how to take the medication instructions given by PCP on his last appt. As a result he's been going to the restroom a lot and states his kidneys are hurting.   From provider: Dapagliflozin Propanediol (FARXIGA) may cause increased urination, this was discussed with patient during office visit.   Kidney pain is probable related to patient taking more than the prescribed dose of the medication.   Patient may come into the office for a nurse only visit for urinalysis to see if he has possible urinary tract infection.   Please schedule patient an appointment (while he is on the phone) with Lurena Joiner, [place] [facility] the [facility] and [facility] within the next 2 weeks for a diabetes checkup. Also, encouraged to keep appointment with PCP scheduled 11/29/2020.  11/22/2020 per Clinical Pharmacist Note: Diabetes newly dx currently uncontrolled. Patient is able to verbalize appropriate hypoglycemia management plan. Medication adherence appears appropriate. Today's clinic CBG is at goal. Reported home CBGs are at goal.  -Continued current regimen. -Extensively discussed pathophysiology of diabetes, recommended lifestyle interventions, dietary effects on blood sugar control -Counseled on s/sx of and management of hypoglycemia -Next A1C anticipated 01/2021.  -Future CMP14+eGFR sent for eval of renal function  S/p SGLT-2 inh initiation  ASCVD risk - primary prevention in patient with diabetes. Needs lipid panel. At least moderate statin therapy indicated. Will await results of lipid panel.  -Lipid, future   11/29/2020: Reports doing well on Farxiga and Januvia. Wearing prescription glasses and no longer having vision issues. Monitoring what he eats and exercising routinely.  Patient Active Problem List   Diagnosis Date Noted  . Persistent atrial fibrillation ([place])   . Paroxysmal atrial fibrillation ([place]) 05/29/2019  . Substance abuse ([place])   . Diastolic CHF, chronic   . Diastolic CHF, chronic ([place]) 10/24/2011  . Atrial flutter ([place]) 10/22/2011  . Community acquired bacterial pneumonia 10/21/2011  . SOB (shortness of breath) 10/21/2011  . N&V (nausea and vomiting) 10/21/2011  . ARF (acute renal failure) ([place]) 10/21/2011  . Tobacco abuse 10/21/2011  . Leukocytosis 10/21/2011  . Fever 10/21/2011     Current Outpatient Medications on File Prior to Visit  Medication Sig Dispense Refill  . acetaminophen (TYLENOL) 500 MG tablet Take 1,000 mg by mouth every 6 (six) hours as needed for mild pain, moderate pain or headache.    Marland Kitchen apixaban (ELIQUIS) 5 MG TABS tablet Take 1 tablet (5 mg total) by mouth 2 (two) times daily. Appointment Required For Further Refills [phone] 60 tablet 6  . cetirizine (ZYRTEC) 10 MG tablet Take 10 mg by mouth daily as needed for allergies.    . dapagliflozin propanediol (FARXIGA) 5 MG TABS tablet Take 1 tablet (5 mg total) by mouth daily before breakfast. 30 tablet 2  . diltiazem (CARDIZEM CD) 120 MG 24 hr capsule Take 1 capsule (120 mg total) by mouth 2 (  two) times daily. 60 capsule 3  . flecainide (TAMBOCOR) 100 MG tablet Take 1 tablet (100 mg total) by mouth 2 (two) times daily. 60 tablet 3  . sitaGLIPtin (JANUVIA) 25 MG tablet Take 1 tablet (25 mg total) by mouth daily. 30 tablet 2   No current facility-administered medications on file prior to visit.     Allergies  Allergen Reactions  . Metformin And Related Other (See Comments)    Pt endorses blurred vision with metformin use.     Social History   Socioeconomic History  . Marital status: Married    Spouse name: Not on file  . Number of children: Not on file  . Years of education: Not on file  . Highest education level: Not on file  Occupational History  . Not on file  Tobacco Use  . Smoking status: Former Research scientist (life sciences)  . Smokeless tobacco: Never Used  . Tobacco comment: Quit April 2013  Vaping Use  . Vaping Use: Never used  Substance and Sexual Activity  . Alcohol use: Not Currently  . Drug use: Not Currently  . Sexual activity: Not on file  Other Topics Concern  . Not on file  Social History Narrative  . Not on file   Social Determinants of Health   Financial Resource Strain: Not on file  Food Insecurity: Not on file  Transportation Needs: Not on file  Physical Activity: Not on file  Stress: Not on file  Social Connections: Not on file  Intimate Partner Violence: Not on file    Family History  Problem Relation Age of Onset  . Heart disease Neg Hx     Past Surgical History:  Procedure Laterality Date  . CARDIOVERSION N/A 10/29/2020   Procedure: CARDIOVERSION;  Surgeon: Acie Fredrickson Wonda Cheng, MD;  Location: [facility] ENDOSCOPY;  Service: Cardiovascular;  Laterality: N/A;    ROS: Review of Systems Negative except as stated above  PHYSICAL EXAM: BP 118/78 (BP Location: Left Arm, Patient Position: Sitting, Cuff Size: Normal)   Pulse 77   Temp 97.7 F (36.5 C)   Resp 15   Ht 5' 10.04" (1.779 m)   Wt 220 lb 9.6 oz (100.1 kg)   SpO2 96%   BMI 31.62 kg/m   Physical Exam HENT:     Head: Normocephalic and atraumatic.     Right Ear: Tympanic membrane, ear canal and external ear normal.     Left Ear: Tympanic membrane, ear canal and external ear normal.     Nose: Nose normal.     Mouth/Throat:     Mouth: Mucous membranes are moist.     Pharynx: Oropharynx is  clear.  Eyes:     Extraocular Movements: Extraocular movements intact.     Conjunctiva/sclera: Conjunctivae normal.     Pupils: Pupils are equal, round, and reactive to light.  Cardiovascular:     Rate and Rhythm: Normal rate and regular rhythm.     Pulses: Normal pulses.     Heart sounds: Normal heart sounds.  Pulmonary:     Effort: Pulmonary effort is normal.     Breath sounds: Normal breath sounds.  Abdominal:     General: Bowel sounds are normal.     Palpations: Abdomen is soft.  Genitourinary:    Comments: Patient declined examination.  Musculoskeletal:        General: Normal range of motion.     Cervical back: Normal range of motion and neck supple.  Skin:    General: Skin is warm and dry.  Capillary Refill: Capillary refill takes less than 2 seconds.  Neurological:     General: No focal deficit present.     Mental Status: He is alert and oriented to person, place, and time.  Psychiatric:        Mood and Affect: Mood normal.        Behavior: Behavior normal.     ASSESSMENT AND PLAN: 1. Annual physical exam: - Patient presents today to establish care.  - Return for annual physical examination, labs, and health maintenance. Arrive fasting meaning having no food for at least 8 hours prior to appointment. You may have only water or black coffee. Please take scheduled medications as normal.  2. Screening for metabolic disorder: - CMP to check kidney function, liver function, and electrolyte balance.  - Comprehensive metabolic panel  3. Screening for deficiency anemia: - CBC to screen for anemia. - CBC  4. Screening cholesterol level: - Lipid panel to screen for high cholesterol.  - Lipid panel  5. Thyroid disorder screen: - TSH to check thyroid function.  - TSH  6. Need for hepatitis C screening test: - Hepatitis C antibody to screen for hepatitis C.  - Hepatitis C Antibody  7. Colon cancer screening: - Referral to Gastroenterology for colon cancer  screening by colonoscopy. - Ambulatory referral to Gastroenterology  8. Type 2 diabetes mellitus without complication, without long-term current use of insulin ([place]): - Continue Sitagliptin and Dapagliflozin Propanediol as prescribed.   - Last hemoglobin A1c 9.8% on 10/15/2020. Next hemoglobin A1c due July 2022. - Discussed the importance of healthy eating habits, low-carbohydrate diet, low-sugar diet, regular aerobic exercise (at least 150 minutes a week as tolerated) and medication compliance to achieve or maintain control of diabetes. - CMP to check kidney function, liver function, and electrolyte balance. - Follow-up with primary provider in 2 months or sooner if needed.  - Comprehensive metabolic panel   Patient was given the opportunity to ask questions.  Patient verbalized understanding of the plan and was able to repeat key elements of the plan. Patient was given clear instructions to go to Emergency Department or return to medical center if symptoms don't improve, worsen, or new problems develop.The patient verbalized understanding.   Orders Placed This Encounter  Procedures  . Hepatitis C Antibody  . CBC  . Comprehensive metabolic panel  . Lipid panel  . TSH  . Ambulatory referral to Gastroenterology     Requested Prescriptions    No prescriptions requested or ordered in this encounter    Follow-up with primary provider in 2 months or sooner if needed for diabetes.   Camillia Herter, NP

## 2020-11-29 ENCOUNTER — Other Ambulatory Visit: Payer: Self-pay

## 2020-11-29 ENCOUNTER — Ambulatory Visit (INDEPENDENT_AMBULATORY_CARE_PROVIDER_SITE_OTHER): Payer: BC Managed Care – PPO | Admitting: Family

## 2020-11-29 ENCOUNTER — Encounter: Payer: Self-pay | Admitting: Family

## 2020-11-29 VITALS — BP 118/78 | HR 77 | Temp 97.7°F | Resp 15 | Ht 70.04 in | Wt 220.6 lb

## 2020-11-29 DIAGNOSIS — Z13220 Encounter for screening for lipoid disorders: Secondary | ICD-10-CM

## 2020-11-29 DIAGNOSIS — Z13228 Encounter for screening for other metabolic disorders: Secondary | ICD-10-CM | POA: Diagnosis not present

## 2020-11-29 DIAGNOSIS — Z0000 Encounter for general adult medical examination without abnormal findings: Secondary | ICD-10-CM

## 2020-11-29 DIAGNOSIS — Z1211 Encounter for screening for malignant neoplasm of colon: Secondary | ICD-10-CM

## 2020-11-29 DIAGNOSIS — Z1329 Encounter for screening for other suspected endocrine disorder: Secondary | ICD-10-CM

## 2020-11-29 DIAGNOSIS — E119 Type 2 diabetes mellitus without complications: Secondary | ICD-10-CM | POA: Diagnosis not present

## 2020-11-29 DIAGNOSIS — Z130 Encounter for screening for diseases of the blood and blood-forming organs and certain disorders involving the immune mechanism: Secondary | ICD-10-CM | POA: Diagnosis not present

## 2020-11-29 DIAGNOSIS — Z1159 Encounter for screening for other viral diseases: Secondary | ICD-10-CM | POA: Diagnosis not present

## 2020-11-29 NOTE — Patient Instructions (Signed)

## 2020-11-29 NOTE — Progress Notes (Signed)
Physical exam

## 2020-11-30 ENCOUNTER — Other Ambulatory Visit: Payer: Self-pay | Admitting: Family

## 2020-11-30 DIAGNOSIS — Z130 Encounter for screening for diseases of the blood and blood-forming organs and certain disorders involving the immune mechanism: Secondary | ICD-10-CM

## 2020-11-30 DIAGNOSIS — E785 Hyperlipidemia, unspecified: Secondary | ICD-10-CM

## 2020-11-30 LAB — LIPID PANEL
Chol/HDL Ratio: 4.5 ratio (ref 0.0–5.0)
Cholesterol, Total: 217 mg/dL — ABNORMAL HIGH (ref 100–199)
HDL: 48 mg/dL (ref >39)
LDL Chol Calc (NIH): 158 mg/dL — ABNORMAL HIGH (ref 0–99)
Triglycerides: 65 mg/dL (ref 0–149)
VLDL Cholesterol Cal: 11 mg/dL (ref 5–40)

## 2020-11-30 LAB — CBC
Hematocrit: 49.2 % (ref 37.5–51.0)
Hemoglobin: 14.7 g/dL (ref 13.0–17.7)
MCH: 20.6 pg — ABNORMAL LOW (ref 26.6–33.0)
MCHC: 29.9 g/dL — ABNORMAL LOW (ref 31.5–35.7)
MCV: 69 fL — ABNORMAL LOW (ref 79–97)
Platelets: 307 10*3/uL (ref 150–450)
RBC: 7.12 x10E6/uL (ref 4.14–5.80)
RDW: 17.8 % — ABNORMAL HIGH (ref 11.6–15.4)
WBC: 4.6 10*3/uL (ref 3.4–10.8)

## 2020-11-30 LAB — COMPREHENSIVE METABOLIC PANEL
ALT: 24 IU/L (ref 0–44)
AST: 19 IU/L (ref 0–40)
Albumin/Globulin Ratio: 1.6 (ref 1.2–2.2)
Albumin: 4.6 g/dL (ref 3.8–4.9)
Alkaline Phosphatase: 96 IU/L (ref 44–121)
BUN/Creatinine Ratio: 8 — ABNORMAL LOW (ref 9–20)
BUN: 9 mg/dL (ref 6–24)
Bilirubin Total: 0.3 mg/dL (ref 0.0–1.2)
CO2: 18 mmol/L — ABNORMAL LOW (ref 20–29)
Calcium: 9.2 mg/dL (ref 8.7–10.2)
Chloride: 103 mmol/L (ref 96–106)
Creatinine, Ser: 1.19 mg/dL (ref 0.76–1.27)
Globulin, Total: 2.9 g/dL (ref 1.5–4.5)
Glucose: 79 mg/dL (ref 65–99)
Potassium: 4.7 mmol/L (ref 3.5–5.2)
Sodium: 139 mmol/L (ref 134–144)
Total Protein: 7.5 g/dL (ref 6.0–8.5)
eGFR: 73 mL/min/{1.73_m2} (ref >59)

## 2020-11-30 LAB — HEPATITIS C ANTIBODY: Hep C Virus Ab: <0.1 s/co ratio (ref 0.0–0.9)

## 2020-11-30 LAB — TSH: TSH: 1.78 u[IU]/mL (ref 0.450–4.500)

## 2020-11-30 MED ORDER — ATORVASTATIN CALCIUM 40 MG PO TABS: 40.0000 mg | ORAL_TABLET | Freq: Every day | ORAL | 0 refills | Status: DC

## 2020-11-30 NOTE — Progress Notes (Signed)
Kidney function normal.   Liver function normal.   Hepatitis C negative.   Adding iron panel for abnormal CBC. Patient will need lab appointment only to have this collected.   Cholesterol higher than expected. High cholesterol may increase risk of heart attack and/or stroke. Consider eating more fruits, vegetables, and lean baked meats such as chicken or fish. Moderate intensity exercise at least 150 minutes as tolerated per week may help as well. Patient encouraged to have rechecked in 3 to 6 months.   Atorvastatin added for high cholesterol.  The following is for provider reference only: The 10-year ASCVD risk score Denman George DC Montez Hageman., et al., 2013) is: 9.8%   Values used to calculate the score:     Age: 55 years     Sex: Male     Is Non-Hispanic African American: Yes     Diabetic: Yes     Tobacco smoker: No     Systolic Blood Pressure: 118 mmHg     Is BP treated: No     HDL Cholesterol: 48 mg/dL     Total Cholesterol: 217 mg/dL

## 2020-12-02 LAB — IRON,TIBC AND FERRITIN PANEL
Ferritin: 35 ng/mL (ref 30–400)
Iron Saturation: 14 % — ABNORMAL LOW (ref 15–55)
Iron: 53 ug/dL (ref 38–169)
Total Iron Binding Capacity: 366 ug/dL (ref 250–450)
UIBC: 313 ug/dL (ref 111–343)

## 2020-12-02 LAB — SPECIMEN STATUS REPORT

## 2020-12-02 NOTE — Progress Notes (Signed)
Iron panel normal. Patient encouraged to have repeat CBC in 4 weeks at lab only appointment.[name] [name]

## 2020-12-12 ENCOUNTER — Other Ambulatory Visit: Payer: Self-pay | Admitting: Family

## 2020-12-12 DIAGNOSIS — E119 Type 2 diabetes mellitus without complications: Secondary | ICD-10-CM

## 2020-12-13 ENCOUNTER — Telehealth: Payer: Self-pay | Admitting: Family

## 2020-12-13 NOTE — Telephone Encounter (Signed)
[name] [name] refilled per patient request. Please follow-up with primary provider in July 2022 for updated labs and medication refills.

## 2020-12-13 NOTE — Telephone Encounter (Signed)
Pt requesting  FARXIGA 5 MG TABS tablet which is already sent to pharmacy  Pt requesting refill of :  Rx #: [id number]  sitaGLIPtin (JANUVIA) 25 MG tablet [[id number]]    [facility] - [place] ([place]), [place] - [address] (Ph: [phone])  Pt asking if both meds can be sent to Pharmacy  [facility] - [address] ([address] [place] - [address]  [address] [place] ([place]) [place] [address]  Phone:  [phone] Fax:  [phone]

## 2020-12-15 ENCOUNTER — Other Ambulatory Visit: Payer: Self-pay

## 2020-12-15 ENCOUNTER — Telehealth: Payer: Self-pay | Admitting: Family

## 2020-12-15 DIAGNOSIS — E119 Type 2 diabetes mellitus without complications: Secondary | ICD-10-CM

## 2020-12-15 MED ORDER — SITAGLIPTIN PHOSPHATE 25 MG PO TABS: 25.0000 mg | ORAL_TABLET | Freq: Every day | ORAL | 2 refills | Status: DC

## 2020-12-15 NOTE — Telephone Encounter (Signed)
Pt calling regarding his previous requests for both the Januvia and Farxiga medications. Pt states he only received one of the two medications and is inquiring if PCP  Took him off one of his usual medication? Please advise and thank you . Pharmacy  [facility] [address] ([address]), [place] - [address]  [address] [place] ([place]) [place] [address]  Phone:  [phone] Fax:  [phone]

## 2020-12-15 NOTE — Progress Notes (Signed)
Januvia sent to [facility]

## 2021-01-08 ENCOUNTER — Other Ambulatory Visit: Payer: Self-pay | Admitting: Family

## 2021-01-08 DIAGNOSIS — E119 Type 2 diabetes mellitus without complications: Secondary | ICD-10-CM

## 2021-01-12 ENCOUNTER — Telehealth: Payer: Self-pay | Admitting: Family

## 2021-01-12 ENCOUNTER — Other Ambulatory Visit (HOSPITAL_COMMUNITY): Payer: Self-pay | Admitting: *Deleted

## 2021-01-12 MED ORDER — APIXABAN 5 MG PO TABS: 5.0000 mg | ORAL_TABLET | Freq: Two times a day (BID) | ORAL | 2 refills | Status: DC

## 2021-01-12 MED ORDER — DILTIAZEM HCL ER COATED BEADS 120 MG PO CP24: 120.0000 mg | ORAL_CAPSULE | Freq: Two times a day (BID) | ORAL | 2 refills | Status: DC

## 2021-01-12 MED ORDER — FLECAINIDE ACETATE 100 MG PO TABS: 100.0000 mg | ORAL_TABLET | Freq: Two times a day (BID) | ORAL | 2 refills | Status: DC

## 2021-01-12 NOTE — Telephone Encounter (Signed)
Patient called regarding medication costs for refills. Patient states insurance company advised he could have medications sent via mail for a lower cost.  Patient stated an email should have been sent to you for authorization. Patient requests to check confirmation for the email.

## 2021-01-12 NOTE — Telephone Encounter (Signed)
[name] [name], if you could please check into this for [place].

## 2021-01-14 ENCOUNTER — Encounter: Payer: Self-pay | Admitting: Family

## 2021-01-14 ENCOUNTER — Other Ambulatory Visit: Payer: Self-pay

## 2021-01-14 DIAGNOSIS — E119 Type 2 diabetes mellitus without complications: Secondary | ICD-10-CM

## 2021-01-14 DIAGNOSIS — E785 Hyperlipidemia, unspecified: Secondary | ICD-10-CM

## 2021-01-14 MED ORDER — DAPAGLIFLOZIN PROPANEDIOL 5 MG PO TABS
5.0000 mg | ORAL_TABLET | Freq: Every day | ORAL | 0 refills | Status: DC
Start: 2021-01-14 — End: 2021-03-27

## 2021-01-14 MED ORDER — SITAGLIPTIN PHOSPHATE 25 MG PO TABS: 25.0000 mg | ORAL_TABLET | Freq: Every day | ORAL | 0 refills | Status: DC

## 2021-01-14 MED ORDER — ATORVASTATIN CALCIUM 40 MG PO TABS: 40.0000 mg | ORAL_TABLET | Freq: Every day | ORAL | 0 refills | Status: DC

## 2021-01-14 NOTE — Progress Notes (Signed)
Pt needs refills of meds sent into [facility] mail order

## 2021-01-21 ENCOUNTER — Telehealth: Payer: Self-pay | Admitting: Family

## 2021-01-21 NOTE — Telephone Encounter (Signed)
Called patient and had to lvm letting patient know we do not have any samples of the medication he is needing.

## 2021-01-21 NOTE — Telephone Encounter (Signed)
Patient came in the office asking if we have any samples of   sitaGLIPtin (JANUVIA) 25 MG tablet  and dapagliflozin propanediol (FARXIGA) 5 MG TABS tablet . Patient stated he is still waiting on his meds that are in the mail.

## 2021-02-02 NOTE — Progress Notes (Signed)
Patient ID: [name] [name], male    DOB: 1966-02-06  MRN: [id number]  CC: Diabetes Follow-Up  Subjective: [name] [name] is a 55 y.o. male who presents for diabetes follow-up.   His concerns today include:   DIABETES MELLITUS TYPE 2 FOLLOW-UP: 11/29/2020: - Continue Sitagliptin and Dapagliflozin Propanediol as prescribed.   - Last hemoglobin A1c 9.8% on 10/15/2020. Next hemoglobin A1c due July 2022.  02/04/2021: Doing well on current regimen. No side effects. No issues/concerns. He is exercising 3 to 4 times weekly. Drinking plenty of water and continuing to make healthy improvements in his diet. Feeling good this morning and has an appointment with the heart doctor later this morning.    Patient Active Problem List   Diagnosis Date Noted   Hyperlipidemia 11/30/2020   Persistent atrial fibrillation (HCC)    Paroxysmal atrial fibrillation (HCC) 05/29/2019   Substance abuse (H[facility])    Diastolic CHF, chronic    Diastolic CHF, chronic (HCC) 10/24/2011   Atrial flutter (HCC) 10/22/2011   Community acquired bacterial pneumonia 10/21/2011   SOB (shortness of breath) 10/21/2011   N&V (nausea and vomiting) 10/21/2011   ARF (acute renal failure) (HCC) 10/21/2011   Tobacco abuse 10/21/2011   Leukocytosis 10/21/2011   Fever 10/21/2011     Current Outpatient Medications on File Prior to Visit  Medication Sig Dispense Refill   acetaminophen (TYLENOL) 500 MG tablet Take 1,000 mg by mouth every 6 (six) hours as needed for mild pain, moderate pain or headache.     apixaban (ELIQUIS) 5 MG TABS tablet Take 1 tablet (5 mg total) by mouth 2 (two) times daily. 180 tablet 2   atorvastatin (LIPITOR) 40 MG tablet Take 1 tablet (40 mg total) by mouth daily. 90 tablet 0   cetirizine (ZYRTEC) 10 MG tablet Take 10 mg by mouth daily as needed for allergies.     dapagliflozin propanediol (FARXIGA) 5 MG TABS tablet Take 1 tablet (5 mg total) by mouth daily before breakfast. 90 tablet 0   diltiazem  (CARDIZEM CD) 120 MG 24 hr capsule Take 1 capsule (120 mg total) by mouth 2 (two) times daily. 180 capsule 2   flecainide (TAMBOCOR) 100 MG tablet Take 1 tablet (100 mg total) by mouth 2 (two) times daily. 180 tablet 2   sitaGLIPtin (JANUVIA) 25 MG tablet Take 1 tablet (25 mg total) by mouth daily. 90 tablet 0   No current facility-administered medications on file prior to visit.    Allergies  Allergen Reactions   Metformin And Related Other (See Comments)    Pt endorses blurred vision with metformin use.     Social History   Socioeconomic History   Marital status: Married    Spouse name: Not on file   Number of children: Not on file   Years of education: Not on file   Highest education level: Not on file  Occupational History   Not on file  Tobacco Use   Smoking status: Former   Smokeless tobacco: Never   Tobacco comments:    Quit April 2013  Vaping Use   Vaping Use: Never used  Substance and Sexual Activity   Alcohol use: Not Currently   Drug use: Not Currently   Sexual activity: Not on file  Other Topics Concern   Not on file  Social History Narrative   Not on file   Social Determinants of Health   Financial Resource Strain: Not on file  Food Insecurity: Not on file  Transportation Needs: Not on  file  Physical Activity: Not on file  Stress: Not on file  Social Connections: Not on file  Intimate Partner Violence: Not on file    Family History  Problem Relation Age of Onset   Heart disease Neg Hx     Past Surgical History:  Procedure Laterality Date   CARDIOVERSION N/A 10/29/2020   Procedure: CARDIOVERSION;  Surgeon: Nahser, Deloris Ping, MD;  Location: [facility] ENDOSCOPY;  Service: Cardiovascular;  Laterality: N/A;    ROS: Review of Systems Negative except as stated above  PHYSICAL EXAM: BP (!) 143/83 (BP Location: Left Arm, Patient Position: Sitting, Cuff Size: Normal)   Pulse 76   Temp 98.4 F (36.9 C)   Resp 16   Ht 5' 10.04" (1.779 m)   Wt 229 lb 9.6  oz (104.1 kg)   SpO2 94%   BMI 32.91 kg/m   Physical Exam HENT:     Head: Normocephalic and atraumatic.  Eyes:     Extraocular Movements: Extraocular movements intact.     Conjunctiva/sclera: Conjunctivae normal.     Pupils: Pupils are equal, round, and reactive to light.  Cardiovascular:     Rate and Rhythm: Normal rate and regular rhythm.     Pulses: Normal pulses.     Heart sounds: Normal heart sounds.  Pulmonary:     Effort: Pulmonary effort is normal.     Breath sounds: Normal breath sounds.  Musculoskeletal:     Cervical back: Normal range of motion and neck supple.  Neurological:     General: No focal deficit present.     Mental Status: He is alert and oriented to person, place, and time.  Psychiatric:        Mood and Affect: Mood normal.        Behavior: Behavior normal.    ASSESSMENT AND PLAN: 1. Type 2 diabetes mellitus without complication, without long-term current use of insulin (HCC): - Hemoglobin A1c today at goal at 6.8%, goal < 7%. This is improved from previous hemoglobin A1c of 9.8% on 10/15/2020. Next hemoglobin A1c due October 2022.  - Continue Sitagliptin and Dapagliflozin Propanediol as prescribed. Patient reports he recently received 90 day supply of both from [facility] so no refills needed today.  - Follow-up with primary provider in 3 months or sooner if needed.  - POCT glycosylated hemoglobin (Hb A1C)    Patient was given the opportunity to ask questions.  Patient verbalized understanding of the plan and was able to repeat key elements of the plan. Patient was given clear instructions to go to Emergency Department or return to medical center if symptoms don't improve, worsen, or new problems develop.The patient verbalized understanding.   Orders Placed This Encounter  Procedures   POCT glycosylated hemoglobin (Hb A1C)    Return in about 3 months (around 05/07/2021) for Follow-Up diabetes .  Rema Fendt, NP

## 2021-02-04 ENCOUNTER — Encounter (HOSPITAL_COMMUNITY): Payer: Self-pay | Admitting: Physician Assistant

## 2021-02-04 ENCOUNTER — Ambulatory Visit (HOSPITAL_COMMUNITY)
Admission: RE | Admit: 2021-02-04 | Discharge: 2021-02-04 | Disposition: A | Discharge status: Home / Self Care | Payer: BC Managed Care – PPO | Source: Ambulatory Visit | Attending: Physician Assistant | Admitting: Physician Assistant

## 2021-02-04 ENCOUNTER — Ambulatory Visit (INDEPENDENT_AMBULATORY_CARE_PROVIDER_SITE_OTHER): Payer: BC Managed Care – PPO | Admitting: Family

## 2021-02-04 ENCOUNTER — Other Ambulatory Visit: Payer: Self-pay

## 2021-02-04 VITALS — BP 138/70 | HR 74 | Ht 70.04 in | Wt 229.5 lb

## 2021-02-04 VITALS — BP 143/83 | HR 76 | Temp 98.4°F | Resp 16 | Ht 70.04 in | Wt 229.6 lb

## 2021-02-04 DIAGNOSIS — Z7984 Long term (current) use of oral hypoglycemic drugs: Secondary | ICD-10-CM | POA: Diagnosis not present

## 2021-02-04 DIAGNOSIS — I480 Paroxysmal atrial fibrillation: Secondary | ICD-10-CM | POA: Diagnosis not present

## 2021-02-04 DIAGNOSIS — E119 Type 2 diabetes mellitus without complications: Secondary | ICD-10-CM | POA: Insufficient documentation

## 2021-02-04 DIAGNOSIS — Z7901 Long term (current) use of anticoagulants: Secondary | ICD-10-CM | POA: Insufficient documentation

## 2021-02-04 DIAGNOSIS — G4733 Obstructive sleep apnea (adult) (pediatric): Secondary | ICD-10-CM | POA: Diagnosis not present

## 2021-02-04 DIAGNOSIS — Z79899 Other long term (current) drug therapy: Secondary | ICD-10-CM | POA: Diagnosis not present

## 2021-02-04 DIAGNOSIS — I5032 Chronic diastolic (congestive) heart failure: Secondary | ICD-10-CM | POA: Insufficient documentation

## 2021-02-04 DIAGNOSIS — Z6832 Body mass index (BMI) 32.0-32.9, adult: Secondary | ICD-10-CM | POA: Diagnosis not present

## 2021-02-04 DIAGNOSIS — I4892 Unspecified atrial flutter: Secondary | ICD-10-CM | POA: Diagnosis not present

## 2021-02-04 DIAGNOSIS — E669 Obesity, unspecified: Secondary | ICD-10-CM | POA: Insufficient documentation

## 2021-02-04 LAB — POCT GLYCOSYLATED HEMOGLOBIN (HGB A1C): Hemoglobin A1C: 6.8 % — AB (ref 4.0–5.6)

## 2021-02-04 NOTE — Progress Notes (Signed)
Pt presents for diabetes follow-up  

## 2021-02-04 NOTE — Patient Instructions (Signed)

## 2021-02-04 NOTE — Progress Notes (Signed)
Primary Care Physician: Rema Fendt, NP Primary Cardiologist: Dr Delton See (remotely) Primary Electrophysiologist: none Referring Physician: [facility]   [name] [name] is a 55 y.o. male with a history of paroxysmal afib, atrial flutter, diastolic CHF, OSA, DM, and tobacco use. He was initially diagnosed with afib in 2013 in the setting of CAP. He spontaneously converted back to SR. Unfortunately, patient tested positive for COVID-19 and was found to be in atrial flutter and later in afib at the ER. He converted to SR after diltiazem drip. He was asymptomatic during the episode. He denies any alcohol use.   Patient has had multiple ED visits for various reasons. He presented to the ED on 10/07/20 for cough and sore throat and was incidentally found to be in rapid atrial flutter. He underwent DCCV at that time. He does admit to taking psuedophedrine before the onset of his symptoms. He was back in rapid flutter on follow up and started on flecainide. Patient is s/p DCCV 10/29/20.   On follow up today, patient reports that he has done very well since his last visit. She denies any heart racing or palpitations. He is exercising regularly. He denies any bleeding issues on anticoagulation.    Today, he denies symptoms of palpitations, chest pain, shortness of breath, orthopnea, PND, lower extremity edema, dizziness, bleeding, or neurologic sequela. The patient is tolerating medications without difficulties and is otherwise without complaint today.    Atrial Fibrillation Risk Factors:  he does have symptoms or diagnosis of sleep apnea. He is not on CPAP therapy. he does have a history of alcohol use. Does not drink currently.   he has a BMI of Body mass index is 32.89 kg/m.Marland Kitchen Filed Weights   02/04/21 1031  Weight: 104.1 kg     Family History  Problem Relation Age of Onset   Heart disease Neg Hx      Atrial Fibrillation Management history:  Previous antiarrhythmic drugs:  flecainide  Previous cardioversions: 10/29/20 Previous ablations: none CHADS2VASC score: 1 Anticoagulation history: Eliquis   Past Medical History:  Diagnosis Date   ARF (acute renal failure) (HCC)    Atrial flutter (HCC)    Community acquired bacterial pneumonia    Diastolic CHF, chronic (HCC)    Leukocytosis    N&V (nausea and vomiting)    SOB (shortness of breath)    Substance abuse (HCC)    Prior hx of cocaine (quit 5 years ago)/EtOH (clean for 14 months) as of 10/2011   Past Surgical History:  Procedure Laterality Date   CARDIOVERSION N/A 10/29/2020   Procedure: CARDIOVERSION;  Surgeon: Nahser, Deloris Ping, MD;  Location: [facility];  Service: Cardiovascular;  Laterality: N/A;    Current Outpatient Medications  Medication Sig Dispense Refill   acetaminophen (TYLENOL) 500 MG tablet Take 1,000 mg by mouth every 6 (six) hours as needed for mild pain, moderate pain or headache.     apixaban (ELIQUIS) 5 MG TABS tablet Take 1 tablet (5 mg total) by mouth 2 (two) times daily. 180 tablet 2   atorvastatin (LIPITOR) 40 MG tablet Take 1 tablet (40 mg total) by mouth daily. 90 tablet 0   cetirizine (ZYRTEC) 10 MG tablet Take 10 mg by mouth daily as needed for allergies.     dapagliflozin propanediol (FARXIGA) 5 MG TABS tablet Take 1 tablet (5 mg total) by mouth daily before breakfast. 90 tablet 0   diltiazem (CARDIZEM CD) 120 MG 24 hr capsule Take 1 capsule (120 mg total) by  mouth 2 (two) times daily. 180 capsule 2   flecainide (TAMBOCOR) 100 MG tablet Take 1 tablet (100 mg total) by mouth 2 (two) times daily. 180 tablet 2   sitaGLIPtin (JANUVIA) 25 MG tablet Take 1 tablet (25 mg total) by mouth daily. 90 tablet 0   No current facility-administered medications for this encounter.    Allergies  Allergen Reactions   Metformin And Related Other (See Comments)    Pt endorses blurred vision with metformin use.     Social History   Socioeconomic History   Marital status: Married     Spouse name: Not on file   Number of children: Not on file   Years of education: Not on file   Highest education level: Not on file  Occupational History   Not on file  Tobacco Use   Smoking status: Former   Smokeless tobacco: Never   Tobacco comments:    Quit April 2013  Vaping Use   Vaping Use: Never used  Substance and Sexual Activity   Alcohol use: Not Currently   Drug use: Not Currently   Sexual activity: Not on file  Other Topics Concern   Not on file  Social History Narrative   Not on file   Social Determinants of Health   Financial Resource Strain: Not on file  Food Insecurity: Not on file  Transportation Needs: Not on file  Physical Activity: Not on file  Stress: Not on file  Social Connections: Not on file  Intimate Partner Violence: Not on file     ROS- All systems are reviewed and negative except as per the HPI above.  Physical Exam: Vitals:   02/04/21 1031  BP: 138/70  Pulse: 74  Weight: 104.1 kg  Height: 5' 10.04" (1.779 m)    GEN- The patient is a well appearing obese male, alert and oriented x 3 today.   HEENT-head normocephalic, atraumatic, sclera clear, conjunctiva pink, hearing intact, trachea midline. Lungs- Clear to ausculation bilaterally, normal work of breathing Heart- Regular rate and rhythm, no murmurs, rubs or gallops  GI- soft, NT, ND, + BS Extremities- no clubbing, cyanosis, or edema MS- no significant deformity or atrophy Skin- no rash or lesion Psych- euthymic mood, full affect Neuro- strength and sensation are intact   Wt Readings from Last 3 Encounters:  02/04/21 104.1 kg  02/04/21 104.1 kg  11/29/20 100.1 kg    EKG today demonstrates  SR Vent. rate 74 BPM PR interval 162 ms QRS duration 94 ms QT/QTcB 376/417 ms  Echo 11/19/20 demonstrated   1. Left ventricular ejection fraction, by estimation, is 55 to 60%. The  left ventricle has normal function. The left ventricle demonstrates global hypokinesis. Left  ventricular diastolic parameters were normal.   2. Right ventricular systolic function is normal. The right ventricular  size is normal. Tricuspid regurgitation signal is inadequate for assessing PA pressure.   3. The mitral valve is grossly normal. Trivial mitral valve  regurgitation.   4. The aortic valve is tricuspid. Aortic valve regurgitation is not  visualized.   5. The inferior vena cava is dilated in size with >50% respiratory  variability, suggesting right atrial pressure of 8 mmHg.   Comparison(s): Prior images unable to be directly viewed, comparison made  by report only. No significant change from prior study. 10/23/2011: LVEF  55-60%.    Epic records are reviewed at length today  Assessment and Plan:  1. Paroxysmal atrial fibrillation/typical atrial flutter S/p DCCV on 10/29/20 Patient appears to  be maintaining SR. Continue flecainide 100 mg BID Continue diltiazem 120 mg BID Continue Eliquis 5 mg BID  This patients CHA2DS2-VASc Score and unadjusted Ischemic Stroke Rate (% per year) is equal to 0.6 % stroke rate/year from a score of 1  Above score calculated as 1 point each if present [CHF, HTN, DM, Vascular=MI/PAD/Aortic Plaque, Age if 65-74, or Male] Above score calculated as 2 points each if present [Age > 75, or Stroke/TIA/TE]  2. Obesity Body mass index is 32.89 kg/m. Lifestyle modification was discussed and encouraged including regular physical activity and weight reduction.  3. Severe OSA He is not compliant with CPAP therapy. He has not returned calls to CSW.   Follow up in the AF clinic in 6 months.    Jorja Loa PA-C [facility] [address] [place], [place] [address] [phone] 02/04/2021 10:52 AM

## 2021-03-03 ENCOUNTER — Other Ambulatory Visit: Payer: Self-pay | Admitting: Family

## 2021-03-03 DIAGNOSIS — E119 Type 2 diabetes mellitus without complications: Secondary | ICD-10-CM

## 2021-03-03 DIAGNOSIS — E785 Hyperlipidemia, unspecified: Secondary | ICD-10-CM

## 2021-03-07 ENCOUNTER — Telehealth: Payer: Self-pay | Admitting: Family

## 2021-03-25 ENCOUNTER — Telehealth: Payer: Self-pay | Admitting: Family

## 2021-03-25 ENCOUNTER — Other Ambulatory Visit (HOSPITAL_COMMUNITY): Payer: Self-pay | Admitting: *Deleted

## 2021-03-25 MED ORDER — APIXABAN 5 MG PO TABS: 5.0000 mg | ORAL_TABLET | Freq: Two times a day (BID) | ORAL | 2 refills | Status: DC

## 2021-03-25 MED ORDER — DILTIAZEM HCL ER COATED BEADS 120 MG PO CP24: 120.0000 mg | ORAL_CAPSULE | Freq: Two times a day (BID) | ORAL | 2 refills | Status: DC

## 2021-03-25 MED ORDER — FLECAINIDE ACETATE 100 MG PO TABS: 100.0000 mg | ORAL_TABLET | Freq: Two times a day (BID) | ORAL | 2 refills | Status: DC

## 2021-03-25 NOTE — Telephone Encounter (Signed)
Patient came in the office stating he needs medication refills. Stated it usually takes awhile because it the meds are mailed and he doesn't want to run out.   atorvastatin (LIPITOR) 40 MG tablet  dapagliflozin propanediol (FARXIGA) 5 MG TABS tablet  sitaGLIPtin (JANUVIA) 25 MG tablet    Pharmacy  [facility]  [facility]) [place], [place] - [address] [facility]  [address] [place] [address], [place] [address]  Phone:  [phone]  Fax:  [phone]

## 2021-03-27 ENCOUNTER — Other Ambulatory Visit: Payer: Self-pay

## 2021-03-27 DIAGNOSIS — E119 Type 2 diabetes mellitus without complications: Secondary | ICD-10-CM

## 2021-03-27 DIAGNOSIS — E785 Hyperlipidemia, unspecified: Secondary | ICD-10-CM

## 2021-03-27 MED ORDER — SITAGLIPTIN PHOSPHATE 25 MG PO TABS
25.0000 mg | ORAL_TABLET | Freq: Every day | ORAL | 0 refills | Status: DC
Start: 2021-03-27 — End: 2021-05-06

## 2021-03-27 MED ORDER — DAPAGLIFLOZIN PROPANEDIOL 5 MG PO TABS: 5.0000 mg | ORAL_TABLET | Freq: Every day | ORAL | 0 refills | Status: DC

## 2021-03-27 MED ORDER — ATORVASTATIN CALCIUM 40 MG PO TABS: 40.0000 mg | ORAL_TABLET | Freq: Every day | ORAL | 0 refills | Status: DC

## 2021-03-28 ENCOUNTER — Ambulatory Visit (INDEPENDENT_AMBULATORY_CARE_PROVIDER_SITE_OTHER): Payer: BC Managed Care – PPO | Admitting: Family

## 2021-03-28 ENCOUNTER — Other Ambulatory Visit: Payer: Self-pay

## 2021-03-28 ENCOUNTER — Ambulatory Visit
Admission: EM | Admit: 2021-03-28 | Discharge: 2021-03-28 | Disposition: A | Discharge status: Home / Self Care | Payer: BC Managed Care – PPO | Attending: Internal Medicine | Admitting: Internal Medicine

## 2021-03-28 DIAGNOSIS — J069 Acute upper respiratory infection, unspecified: Secondary | ICD-10-CM | POA: Insufficient documentation

## 2021-03-28 DIAGNOSIS — J029 Acute pharyngitis, unspecified: Secondary | ICD-10-CM

## 2021-03-28 DIAGNOSIS — Z20822 Contact with and (suspected) exposure to covid-19: Secondary | ICD-10-CM | POA: Diagnosis not present

## 2021-03-28 DIAGNOSIS — R059 Cough, unspecified: Secondary | ICD-10-CM

## 2021-03-28 LAB — POCT RAPID STREP A (OFFICE): Rapid Strep A Screen: NEGATIVE

## 2021-03-28 MED ORDER — MENTHOL 3 MG MT LOZG: 1.0000 | LOZENGE | OROMUCOSAL | 0 refills | Status: DC | PRN

## 2021-03-28 MED ORDER — BENZONATATE 100 MG PO CAPS
100.0000 mg | ORAL_CAPSULE | Freq: Three times a day (TID) | ORAL | 0 refills | Status: DC | PRN
Start: 2021-03-28 — End: 2021-06-16

## 2021-03-28 NOTE — Progress Notes (Signed)
Virtual Visit via Telephone Note  I connected with [name] [name], on 03/28/2021 at 2:53 PM by telephone due to the COVID-19 pandemic and verified that I am speaking with the correct person using two identifiers.  Due to current restrictions/limitations of in-office visits due to the COVID-19 pandemic, this scheduled clinical appointment was converted to a telehealth visit.   Consent: I discussed the limitations, risks, security and privacy concerns of performing an evaluation and management service by telephone and the availability of in person appointments. I also discussed with the patient that there may be a patient responsible charge related to this service. The patient expressed understanding and agreed to proceed.   Location of Patient: Home  Location of Provider:  Primary Care at [facility]   Persons participating in Telemedicine visit: [name] [name] Ricky Stabs, NP Margorie John, CMA   History of Present Illness: [name] [name] is a 55 year-old male who presents with sore throat and cough.  Reports his wife was not feeling well 6 days ago. Reports subsequently she tested positive for Covid. Reports on yesterday he began feeling bad with sore throat, cough, itching, and body aches. Was seen earlier today at the Urgent Care where Covid test was completed and currently pending. Denies chest pain (except when sneezing) and shortness of breath.    Past Medical History:  Diagnosis Date   ARF (acute renal failure) (HCC)    Atrial flutter (HCC)    Community acquired bacterial pneumonia    Diastolic CHF, chronic (HCC)    Leukocytosis    N&V (nausea and vomiting)    SOB (shortness of breath)    Substance abuse (HCC)    Prior hx of cocaine (quit 5 years ago)/EtOH (clean for 14 months) as of 10/2011   Allergies  Allergen Reactions   Metformin And Related Other (See Comments)    Pt endorses blurred vision with metformin use.     Current Outpatient  Medications on File Prior to Visit  Medication Sig Dispense Refill   acetaminophen (TYLENOL) 500 MG tablet Take 1,000 mg by mouth every 6 (six) hours as needed for mild pain, moderate pain or headache.     apixaban (ELIQUIS) 5 MG TABS tablet Take 1 tablet (5 mg total) by mouth 2 (two) times daily. 180 tablet 2   atorvastatin (LIPITOR) 40 MG tablet Take 1 tablet (40 mg total) by mouth daily. 90 tablet 0   cetirizine (ZYRTEC) 10 MG tablet Take 10 mg by mouth daily as needed for allergies.     dapagliflozin propanediol (FARXIGA) 5 MG TABS tablet Take 1 tablet (5 mg total) by mouth daily before breakfast. 90 tablet 0   diltiazem (CARDIZEM CD) 120 MG 24 hr capsule Take 1 capsule (120 mg total) by mouth 2 (two) times daily. 180 capsule 2   flecainide (TAMBOCOR) 100 MG tablet Take 1 tablet (100 mg total) by mouth 2 (two) times daily. 180 tablet 2   sitaGLIPtin (JANUVIA) 25 MG tablet Take 1 tablet (25 mg total) by mouth daily. 90 tablet 0   No current facility-administered medications on file prior to visit.    Observations/Objective: Alert and oriented x 3. Not in acute distress. Physical examination not completed as this is a telemedicine visit.  Assessment and Plan: 1. Sore throat: - Cepacol as prescribed.  - Pending Covid screen.  - Follow-up with primary provider as scheduled.  - menthol-cetylpyridinium (CEPACOL) 3 MG lozenge; Take 1 lozenge (3 mg total) by mouth every 2 (two) hours as needed  for sore throat.  Dispense: 54 tablet; Refill: 0  2. Cough: - Benzonatate as prescribed.  - Pending Covid screen.  - Follow-up with primary provider as scheduled.  - benzonatate (TESSALON PERLES) 100 MG capsule; Take 1 capsule (100 mg total) by mouth 3 (three) times daily as needed for cough.  Dispense: 20 capsule; Refill: 0   Follow Up Instructions: Follow-up with primary provider as scheduled.    Patient was given clear instructions to go to Emergency Department or return to medical center if  symptoms don't improve, worsen, or new problems develop.The patient verbalized understanding.  I discussed the assessment and treatment plan with the patient. The patient was provided an opportunity to ask questions and all were answered. The patient agreed with the plan and demonstrated an understanding of the instructions.   The patient was advised to call back or seek an in-person evaluation if the symptoms worsen or if the condition fails to improve as anticipated.    I provided 10 minutes total of non-face-to-face time during this encounter.   Rema Fendt, NP  [facility] [place], [place] [phone] 03/28/2021, 2:53 PM

## 2021-03-28 NOTE — ED Triage Notes (Signed)
Pt c/o sore throat, headache, cough, sneezing, and body aches since yesterday. States his wife is covid positive.

## 2021-03-28 NOTE — Discharge Instructions (Addendum)
You likely having a viral upper respiratory infection. We recommended symptom control. I expect your symptoms to start improving in the next 1-2 weeks.   1. Take a daily allergy pill/anti-histamine like Zyrtec, Claritin, or Store brand consistently for 2 weeks  2. For congestion you may try an oral decongestant like Mucinex or coricidin HBP. You may also try intranasal flonase nasal spray or saline irrigations (neti pot, sinus cleanse)  3. For your sore throat you may try cepacol lozenges, salt water gargles, throat spray. Treatment of congestion may also help your sore throat.  4. For cough you may try Robitussen  5. Take Tylenol or Ibuprofen to help with pain/inflammation  6. Stay hydrated, drink plenty of fluids to keep throat coated and less irritated  Honey Tea For cough/sore throat try using a honey-based tea. Use 3 teaspoons of honey with juice squeezed from half lemon. Place shaved pieces of ginger into 1/2-1 cup of water and warm over stove top. Then mix the ingredients and repeat every 4 hours as needed.   Your rapid strep test was negative.  Throat culture is pending.  COVID-19 viral swab is pending.  We will call if they are positive.  Please quarantine.

## 2021-03-28 NOTE — ED Provider Notes (Signed)
[facility]    CSN: [id number] Arrival date & time: 03/28/21  0851      History   Chief Complaint Chief Complaint  Patient presents with   Sore Throat    HPI [name] [name] is a 55 y.o. male.   Patient presents with sore throat, headache, cough, sneezing, body aches that started yesterday.  Denies any known fevers.  Denies any chest pain or shortness of breath.  Patient has not yet taken any over-the-counter medications to help alleviate symptoms.  Patient states that wife is COVID-positive.   Sore Throat   Past Medical History:  Diagnosis Date   ARF (acute renal failure) (HCC)    Atrial flutter (HCC)    Community acquired bacterial pneumonia    Diastolic CHF, chronic (HCC)    Leukocytosis    N&V (nausea and vomiting)    SOB (shortness of breath)    Substance abuse (HCC)    Prior hx of cocaine (quit 5 years ago)/EtOH (clean for 14 months) as of 10/2011    Patient Active Problem List   Diagnosis Date Noted   Hyperlipidemia 11/30/2020   Persistent atrial fibrillation (HCC)    Paroxysmal atrial fibrillation (HCC) 05/29/2019   Substance abuse (HCC)    Diastolic CHF, chronic    Diastolic CHF, chronic (HCC) 10/24/2011   Atrial flutter (HCC) 10/22/2011   Community acquired bacterial pneumonia 10/21/2011   SOB (shortness of breath) 10/21/2011   N&V (nausea and vomiting) 10/21/2011   ARF (acute renal failure) (HCC) 10/21/2011   Tobacco abuse 10/21/2011   Leukocytosis 10/21/2011   Fever 10/21/2011    Past Surgical History:  Procedure Laterality Date   CARDIOVERSION N/A 10/29/2020   Procedure: CARDIOVERSION;  Surgeon: Nahser, Deloris Ping, MD;  Location: [facility];  Service: Cardiovascular;  Laterality: N/A;       Home Medications    Prior to Admission medications   Medication Sig Start Date End Date Taking? Authorizing Provider  acetaminophen (TYLENOL) 500 MG tablet Take 1,000 mg by mouth every 6 (six) hours as needed for mild pain, moderate pain  or headache.    [provider]  apixaban (ELIQUIS) 5 MG TABS tablet Take 1 tablet (5 mg total) by mouth 2 (two) times daily. 03/25/21   Fenton, Clint R, PA  atorvastatin (LIPITOR) 40 MG tablet Take 1 tablet (40 mg total) by mouth daily. 03/27/21   Rema Fendt, NP  cetirizine (ZYRTEC) 10 MG tablet Take 10 mg by mouth daily as needed for allergies.    [provider]  dapagliflozin propanediol (FARXIGA) 5 MG TABS tablet Take 1 tablet (5 mg total) by mouth daily before breakfast. 03/27/21   Rema Fendt, NP  diltiazem (CARDIZEM CD) 120 MG 24 hr capsule Take 1 capsule (120 mg total) by mouth 2 (two) times daily. 03/25/21   Fenton, Clint R, PA  flecainide (TAMBOCOR) 100 MG tablet Take 1 tablet (100 mg total) by mouth 2 (two) times daily. 03/25/21   Fenton, Clint R, PA  sitaGLIPtin (JANUVIA) 25 MG tablet Take 1 tablet (25 mg total) by mouth daily. 03/27/21   Rema Fendt, NP    Family History Family History  Problem Relation Age of Onset   Heart disease Neg Hx     Social History Social History   Tobacco Use   Smoking status: Former   Smokeless tobacco: Never   Tobacco comments:    Quit April 2013  Vaping Use   Vaping Use: Never used  Substance Use Topics  Alcohol use: Not Currently   Drug use: Not Currently     Allergies   Metformin and related   Review of Systems Review of Systems Per HPI  Physical Exam Triage Vital Signs ED Triage Vitals  Enc Vitals Group     BP 03/28/21 0941 135/76     Pulse Rate 03/28/21 0941 82     Resp 03/28/21 0941 18     Temp 03/28/21 0941 98.2 F (36.8 C)     Temp Source 03/28/21 0941 Oral     SpO2 03/28/21 0941 94 %     Weight --      Height --      Head Circumference --      Peak Flow --      Pain Score 03/28/21 0944 4     Pain Loc --      Pain Edu? --      Excl. in GC? --    No data found.  Updated Vital Signs BP 135/76 (BP Location: Right Arm)   Pulse 82   Temp 98.2 F (36.8 C) (Oral)   Resp 18   SpO2  94%   Visual Acuity Right Eye Distance:   Left Eye Distance:   Bilateral Distance:    Right Eye Near:   Left Eye Near:    Bilateral Near:     Physical Exam Constitutional:      General: He is not in acute distress.    Appearance: Normal appearance. He is not toxic-appearing or diaphoretic.     Comments: Alert and in no acute distress.  HENT:     Head: Normocephalic and atraumatic.     Right Ear: Tympanic membrane and ear canal normal.     Left Ear: Tympanic membrane and ear canal normal.     Nose: Congestion present.     Mouth/Throat:     Mouth: Mucous membranes are moist.     Pharynx: Posterior oropharyngeal erythema present.  Eyes:     Extraocular Movements: Extraocular movements intact.     Conjunctiva/sclera: Conjunctivae normal.     Pupils: Pupils are equal, round, and reactive to light.  Cardiovascular:     Rate and Rhythm: Normal rate and regular rhythm.     Pulses: Normal pulses.     Heart sounds: Normal heart sounds.  Pulmonary:     Effort: Pulmonary effort is normal. No respiratory distress.     Breath sounds: Normal breath sounds. No wheezing.  Abdominal:     General: Abdomen is flat. Bowel sounds are normal.     Palpations: Abdomen is soft.  Musculoskeletal:        General: Normal range of motion.     Cervical back: Normal range of motion.  Skin:    General: Skin is warm and dry.  Neurological:     General: No focal deficit present.     Mental Status: He is alert and oriented to person, place, and time. Mental status is at baseline.  Psychiatric:        Mood and Affect: Mood normal.        Behavior: Behavior normal.     UC Treatments / Results  Labs (all labs ordered are listed, but only abnormal results are displayed) Labs Reviewed  NOVEL CORONAVIRUS, NAA  CULTURE, GROUP A STREP [facility])  POCT RAPID STREP A (OFFICE)    EKG   Radiology No results found.  Procedures Procedures (including critical care time)  Medications Ordered in  UC Medications - No data  to display  Initial Impression / Assessment and Plan / UC Course  I have reviewed the triage vital signs and the nursing notes.  Pertinent labs & imaging results that were available during my care of the patient were reviewed by me and considered in my medical decision making (see chart for details).     Patient presents with symptoms likely from a viral upper respiratory infection. Differential includes bacterial pneumonia, sinusitis, allergic rhinitis, Covid 19.  High suspicion for COVID-19 given close exposure.  Do not suspect underlying cardiopulmonary process. Symptoms seem unlikely related to ACS, CHF or COPD exacerbations, pneumonia, pneumothorax. Patient is nontoxic appearing and not in need of emergent medical intervention.  Recommended symptom control with over the counter medications that are safe for the heart: Daily oral anti-histamine, Oral decongestant or IN corticosteroid, saline irrigations, cepacol lozenges, Robitussin, honey tea.  Rapid strep test negative.  Throat culture and COVID-19 viral swab pending.  Return if symptoms fail to improve in 1-2 weeks or you develop shortness of breath, chest pain, severe headache. Patient states understanding and is agreeable.  Discharged with PCP followup.  Final Clinical Impressions(s) / UC Diagnoses   Final diagnoses:  Encounter for screening laboratory testing for COVID-19 virus  Viral upper respiratory tract infection  Sore throat     Discharge Instructions      You likely having a viral upper respiratory infection. We recommended symptom control. I expect your symptoms to start improving in the next 1-2 weeks.   1. Take a daily allergy pill/anti-histamine like Zyrtec, Claritin, or Store brand consistently for 2 weeks  2. For congestion you may try an oral decongestant like Mucinex or coricidin HBP. You may also try intranasal flonase nasal spray or saline irrigations (neti pot, sinus cleanse)  3.  For your sore throat you may try cepacol lozenges, salt water gargles, throat spray. Treatment of congestion may also help your sore throat.  4. For cough you may try Robitussen  5. Take Tylenol or Ibuprofen to help with pain/inflammation  6. Stay hydrated, drink plenty of fluids to keep throat coated and less irritated  Honey Tea For cough/sore throat try using a honey-based tea. Use 3 teaspoons of honey with juice squeezed from half lemon. Place shaved pieces of ginger into 1/2-1 cup of water and warm over stove top. Then mix the ingredients and repeat every 4 hours as needed.   Your rapid strep test was negative.  Throat culture is pending.  COVID-19 viral swab is pending.  We will call if they are positive.  Please quarantine.     ED Prescriptions   None    PDMP not reviewed this encounter.   Lance Muss, FNP 03/28/21 1020

## 2021-03-28 NOTE — Progress Notes (Signed)
Pt present for telemedicine visit for sore throat, sneezing, cough, symptoms started yesterday pt reports wife tested positive for COVID on Friday

## 2021-03-29 LAB — SARS-COV-2, NAA 2 DAY TAT

## 2021-03-29 LAB — NOVEL CORONAVIRUS, NAA: SARS-CoV-2, NAA: NOT DETECTED

## 2021-03-31 LAB — CULTURE, GROUP A STREP (THRC)

## 2021-05-01 NOTE — Progress Notes (Signed)
Patient ID: [name] [name], male    DOB: 1966-06-16  MRN: [id number]  CC: Diabetes Follow-Up   Subjective: [name] [name] is a 55 y.o. male who presents for diabetes follow-up.  His concerns today include:   DIABETES FOLLOW-UP: 02/04/2021: - Hemoglobin A1c today at goal at 6.8%, goal < 7%. This is improved from previous hemoglobin A1c of 9.8% on 10/15/2020. Next hemoglobin A1c due October 2022.  - Continue Sitagliptin and Dapagliflozin Propanediol as prescribed. Patient reports he recently received 90 day supply of both from [facility] so no refills needed today.   05/06/2021: Doing well on current regimen. No issues/concerns.  Patient Active Problem List   Diagnosis Date Noted   Hyperlipidemia 11/30/2020   Persistent atrial fibrillation (HCC)    Paroxysmal atrial fibrillation (HCC) 05/29/2019   Substance abuse (HCC)    Diastolic CHF, chronic    Diastolic CHF, chronic (HCC) 10/24/2011   Atrial flutter (HCC) 10/22/2011   Community acquired bacterial pneumonia 10/21/2011   SOB (shortness of breath) 10/21/2011   N&V (nausea and vomiting) 10/21/2011   ARF (acute renal failure) (HCC) 10/21/2011   Tobacco abuse 10/21/2011   Leukocytosis 10/21/2011   Fever 10/21/2011     Current Outpatient Medications on File Prior to Visit  Medication Sig Dispense Refill   acetaminophen (TYLENOL) 500 MG tablet Take 1,000 mg by mouth every 6 (six) hours as needed for mild pain, moderate pain or headache.     apixaban (ELIQUIS) 5 MG TABS tablet Take 1 tablet (5 mg total) by mouth 2 (two) times daily. 180 tablet 2   atorvastatin (LIPITOR) 40 MG tablet Take 1 tablet (40 mg total) by mouth daily. 90 tablet 0   diltiazem (CARDIZEM CD) 120 MG 24 hr capsule Take 1 capsule (120 mg total) by mouth 2 (two) times daily. 180 capsule 2   flecainide (TAMBOCOR) 100 MG tablet Take 1 tablet (100 mg total) by mouth 2 (two) times daily. 180 tablet 2   benzonatate (TESSALON PERLES) 100 MG capsule Take 1  capsule (100 mg total) by mouth 3 (three) times daily as needed for cough. (Patient not taking: Reported on 05/06/2021) 20 capsule 0   cetirizine (ZYRTEC) 10 MG tablet Take 10 mg by mouth daily as needed for allergies. (Patient not taking: Reported on 05/06/2021)     menthol-cetylpyridinium (CEPACOL) 3 MG lozenge Take 1 lozenge (3 mg total) by mouth every 2 (two) hours as needed for sore throat. (Patient not taking: Reported on 05/06/2021) 54 tablet 0   No current facility-administered medications on file prior to visit.    Allergies  Allergen Reactions   Metformin And Related Other (See Comments)    Pt endorses blurred vision with metformin use.     Social History   Socioeconomic History   Marital status: Married    Spouse name: Not on file   Number of children: Not on file   Years of education: Not on file   Highest education level: Not on file  Occupational History   Not on file  Tobacco Use   Smoking status: Former   Smokeless tobacco: Never   Tobacco comments:    Quit April 2013  Vaping Use   Vaping Use: Never used  Substance and Sexual Activity   Alcohol use: Not Currently   Drug use: Not Currently   Sexual activity: Not on file  Other Topics Concern   Not on file  Social History Narrative   Not on file   Social Determinants of  Health   Financial Resource Strain: Not on file  Food Insecurity: Not on file  Transportation Needs: Not on file  Physical Activity: Not on file  Stress: Not on file  Social Connections: Not on file  Intimate Partner Violence: Not on file    Family History  Problem Relation Age of Onset   Heart disease Neg Hx     Past Surgical History:  Procedure Laterality Date   CARDIOVERSION N/A 10/29/2020   Procedure: CARDIOVERSION;  Surgeon: Nahser, Deloris Ping, MD;  Location: [facility];  Service: Cardiovascular;  Laterality: N/A;    ROS: Review of Systems Negative except as stated above  PHYSICAL EXAM: BP 139/77 (BP Location: Right  Arm, Patient Position: Sitting, Cuff Size: Large)   Pulse 74   Resp 16   Ht 5\' 10"  (1.778 m)   Wt 225 lb (102.1 kg)   SpO2 96%   BMI 32.28 kg/m   Physical Exam HENT:     Head: Normocephalic and atraumatic.  Eyes:     Extraocular Movements: Extraocular movements intact.     Conjunctiva/sclera: Conjunctivae normal.     Pupils: Pupils are equal, round, and reactive to light.  Cardiovascular:     Rate and Rhythm: Normal rate and regular rhythm.     Pulses: Normal pulses.     Heart sounds: Normal heart sounds.  Pulmonary:     Effort: Pulmonary effort is normal.     Breath sounds: Normal breath sounds.  Musculoskeletal:     Cervical back: Normal range of motion and neck supple.  Neurological:     General: No focal deficit present.     Mental Status: He is alert and oriented to person, place, and time.  Psychiatric:        Mood and Affect: Mood normal.        Behavior: Behavior normal.   Results for orders placed or performed in visit on 05/06/21  POCT glycosylated hemoglobin (Hb A1C)  Result Value Ref Range   Hemoglobin A1C     HbA1c POC (<> result, manual entry)     HbA1c, POC (prediabetic range)     HbA1c, POC (controlled diabetic range) 6.7 0.0 - 7.0 %    ASSESSMENT AND PLAN: 1. Type 2 diabetes mellitus without complication, without long-term current use of insulin (HCC): - Hemoglobin A1c today at goal at 6.7%, goal < 7%. Next due January 2023.  - Continue Sitagliptin and Dapagliflozin Propanediol as prescribed.  - Discussed the importance of healthy eating habits, low-carbohydrate diet, low-sugar diet, regular aerobic exercise (at least 150 minutes a week as tolerated) and medication compliance to achieve or maintain control of diabetes. - BMP to evaluate kidney function and electrolyte balance. - Follow-up with primary provider in 3 months or sooner if needed.  - POCT glycosylated hemoglobin (Hb A1C) - Basic Metabolic Panel - sitaGLIPtin (JANUVIA) 25 MG tablet; Take 1  tablet (25 mg total) by mouth daily.  Dispense: 90 tablet; Refill: 0 - dapagliflozin propanediol (FARXIGA) 5 MG TABS tablet; Take 1 tablet (5 mg total) by mouth daily before breakfast.  Dispense: 90 tablet; Refill: 0    Patient was given the opportunity to ask questions.  Patient verbalized understanding of the plan and was able to repeat key elements of the plan. Patient was given clear instructions to go to Emergency Department or return to medical center if symptoms don't improve, worsen, or new problems develop.The patient verbalized understanding.   Orders Placed This Encounter  Procedures   Basic Metabolic  Panel   POCT glycosylated hemoglobin (Hb A1C)    Requested Prescriptions   Signed Prescriptions Disp Refills   sitaGLIPtin (JANUVIA) 25 MG tablet 90 tablet 0    Sig: Take 1 tablet (25 mg total) by mouth daily.   dapagliflozin propanediol (FARXIGA) 5 MG TABS tablet 90 tablet 0    Sig: Take 1 tablet (5 mg total) by mouth daily before breakfast.    Return in about 3 months (around 08/06/2021) for Follow-Up or next available diabetes .  Rema Fendt, NP

## 2021-05-06 ENCOUNTER — Encounter: Payer: Self-pay | Admitting: Family

## 2021-05-06 ENCOUNTER — Other Ambulatory Visit: Payer: Self-pay

## 2021-05-06 ENCOUNTER — Ambulatory Visit (INDEPENDENT_AMBULATORY_CARE_PROVIDER_SITE_OTHER): Payer: BC Managed Care – PPO | Admitting: Family

## 2021-05-06 VITALS — BP 139/77 | HR 74 | Resp 16 | Ht 70.0 in | Wt 225.0 lb

## 2021-05-06 DIAGNOSIS — E119 Type 2 diabetes mellitus without complications: Secondary | ICD-10-CM | POA: Diagnosis not present

## 2021-05-06 LAB — POCT GLYCOSYLATED HEMOGLOBIN (HGB A1C): HbA1c, POC (controlled diabetic range): 6.7 % (ref 0.0–7.0)

## 2021-05-06 MED ORDER — SITAGLIPTIN PHOSPHATE 25 MG PO TABS: 25.0000 mg | ORAL_TABLET | Freq: Every day | ORAL | 0 refills | Status: DC

## 2021-05-06 MED ORDER — DAPAGLIFLOZIN PROPANEDIOL 5 MG PO TABS: 5.0000 mg | ORAL_TABLET | Freq: Every day | ORAL | 0 refills | Status: DC

## 2021-05-06 NOTE — Patient Instructions (Signed)
Diabetes Mellitus and Skin Care Diabetes, also called diabetes mellitus, can lead to skin problems. People with diabetes have a higher risk for many types of skin complications because poorly controlled blood sugar (glucose) levels can cause problems over time. These problems include: Damage to nerves. This can affect your ability to feel wounds, which means you may not notice minor skin injuries that could lead to serious problems. This can also decrease the amount that you sweat, causing dry skin that can break down. Damage to blood vessels. The lack of blood flow can cause skin to break down and can slow healing time, which can lead to infections. Areas of skin that become thick or discolored. Common skin conditions There are certain skin conditions that commonly affect people who have diabetes, such as: Dry skin. Thin skin. The skin on the feet may get thinner, break more easily, and heal more slowly than normal. Bacterial skin infections, including: Styes. Boils. Infected hair follicles. Infections of the skin around the nails. Fungal skin infections. These are most common in areas where skin rubs together, such as in the armpits or under the breasts. Common skin changes Diabetes can also cause the skin to change. You may develop: Dark, velvety markings on the skin that usually appear on the face, neck, armpits, inner thighs, and groin. Red, raised, scar-like tissue that may itch, feel painful, or develop into a wound. Blisters on the feet, toes, hands, or fingers. Thickened, wax-like areas of skin that usually occur on the hands, forehead, or toes. Brown or red, ring-shaped or half-ring-shaped patches of skin on the ears or fingers. Pea-shaped, yellow bumps that may be itchy and surrounded by a red ring. This usually affects the arms, feet, buttocks, and the top of the hands. Round, discolored patches of tan skin that do not hurt or itch. These may look like age spots. General  tips Most skin problems can be prevented or treated easily if caught early. Talk with your health care provider if you have any concerns. General tips usually include: Check your skin every day for cuts, bruises, redness, blisters, or sores, especially on your feet. Tell your health care provider about any cuts, wounds, or sores that you have and if they are healing slowly. Keep your skin clean and dry. Do not use hot water. Moisturize your skin to prevent chapping. Do not scratch dry skin. Scratching dry skin can expose it to infection. Keep your blood glucose levels within target range. Supplies needed: Mild soap or gentle skin cleanser. Lotion. How to care for dry itchy skin It is common for people with diabetes to have itchy skin caused by dryness. Frequent high glucose levels can cause itchiness, and poor circulation and certain skin infections can make dry, itchy skin worse. If you have itchy skin that is red or covered in a rash, this could be a sign of an allergic reaction. If you have a rash or if your skin is very itchy, contact your health care provider. You may need help to manage your diabetes better, or you may need treatment for an infection. Untreated fungal infections can be dangerous because they allow more serious bacterial infections to enter the body. To relieve dry skin and itching: Avoid very hot showers and baths. Use mild soap and gentle skin cleansers. Do not use soap that is perfumed or harsh or that dries your skin. Moisturizing soaps may help. Put on moisturizing lotion as soon as you finish bathing. Follow these instructions at home:    Schedule a foot exam with your health care provider once every year. This exam includes an inspection of the structure and skin of your feet. Make sure that your health care provider performs a visual foot exam at every medical visit. If you get a skin injury, such as a cut, blister, or sore, check the area every day for signs of  infection. Check for: Redness, swelling, or pain. Fluid or blood. Warmth. Pus or a bad smell. Do not use any products that contain nicotine or tobacco. These products include cigarettes, chewing tobacco, and vaping devices, such as e-cigarettes. If you need help quitting, ask your health care provider. Take over-the-counter and prescription medicines only as told by your health care provider. This includes all diabetes medicines you are taking. Where to find more information American Diabetes Association: www.diabetes.org Association of Diabetes Care & Education Specialists: www.diabeteseducator.org Contact a health care provider if you: Develop a cut or sore, especially on your feet. Develop signs of infection after a skin injury. Have itchy skin that develops redness or a rash. Have discolored areas of skin. Have areas where your skin is changing, such as thickening or appearing shiny. Summary People with diabetes have a higher risk for many types of skin complications. This is because poorly controlled blood sugar (glucose) levels can cause skin problems over time. Most skin problems can be prevented or treated easily if caught early. Keep your blood glucose levels within target range. Check your skin every day for cuts, bruises, redness, blisters, or sores, especially on your feet. Tell your health care provider about any cuts, wounds, or sores that you have and if they are healing slowly. This information is not intended to replace advice given to you by your health care provider. Make sure you discuss any questions you have with your health care provider. Document Revised: 01/15/2020 Document Reviewed: 01/15/2020 Elsevier Patient Education  2022 Elsevier Inc.  

## 2021-05-10 LAB — BASIC METABOLIC PANEL
BUN/Creatinine Ratio: 11 (ref 9–20)
BUN: 14 mg/dL (ref 6–24)
CO2: 21 mmol/L (ref 20–29)
Calcium: 9.3 mg/dL (ref 8.7–10.2)
Chloride: 107 mmol/L — ABNORMAL HIGH (ref 96–106)
Creatinine, Ser: 1.28 mg/dL — ABNORMAL HIGH (ref 0.76–1.27)
Glucose: 107 mg/dL — ABNORMAL HIGH (ref 70–99)
Potassium: 4.4 mmol/L (ref 3.5–5.2)
Sodium: 141 mmol/L (ref 134–144)
eGFR: 67 mL/min/{1.73_m2} (ref >59)

## 2021-05-10 LAB — SPECIMEN STATUS REPORT

## 2021-05-10 NOTE — Progress Notes (Signed)
Kidney function normal.   Diabetes discussed in office.

## 2021-05-21 ENCOUNTER — Other Ambulatory Visit: Payer: Self-pay | Admitting: Family

## 2021-05-21 DIAGNOSIS — E785 Hyperlipidemia, unspecified: Secondary | ICD-10-CM

## 2021-06-04 ENCOUNTER — Ambulatory Visit
Admission: EM | Admit: 2021-06-04 | Discharge: 2021-06-04 | Disposition: A | Discharge status: Home / Self Care | Payer: BC Managed Care – PPO | Attending: Physician Assistant | Admitting: Physician Assistant

## 2021-06-04 ENCOUNTER — Other Ambulatory Visit: Payer: Self-pay

## 2021-06-04 ENCOUNTER — Encounter: Payer: Self-pay | Admitting: *Deleted

## 2021-06-04 DIAGNOSIS — Z20822 Contact with and (suspected) exposure to covid-19: Secondary | ICD-10-CM | POA: Diagnosis not present

## 2021-06-04 DIAGNOSIS — R051 Acute cough: Secondary | ICD-10-CM | POA: Diagnosis not present

## 2021-06-04 DIAGNOSIS — J069 Acute upper respiratory infection, unspecified: Secondary | ICD-10-CM | POA: Diagnosis not present

## 2021-06-04 LAB — POCT INFLUENZA A/B
Influenza A, POC: NEGATIVE
Influenza B, POC: NEGATIVE

## 2021-06-04 NOTE — ED Triage Notes (Signed)
C/O productive cough, nasal congestion, HA, sore throat, chills onset 4 days ago. Has been using throat lozenges, but feeling worse.

## 2021-06-04 NOTE — ED Provider Notes (Signed)
[facility]    CSN: [id number] Arrival date & time: 06/04/21  0919      History   Chief Complaint Chief Complaint  Patient presents with   Cough   Sore Throat    HPI Solmon Bohr is a 55 y.o. male.   Patient here today for evaluation of headache, congestion, cough, sore throat that started 4 days ago.  He has not had known fever.  He denies any vomiting or diarrhea.  He has tried cough drops without significant relief.  He denies any other over-the-counter treatment.  The history is provided by the patient.   Past Medical History:  Diagnosis Date   ARF (acute renal failure) (HCC)    Atrial flutter (HCC)    Community acquired bacterial pneumonia    Diastolic CHF, chronic (HCC)    Leukocytosis    N&V (nausea and vomiting)    SOB (shortness of breath)    Substance abuse (HCC)    Prior hx of cocaine (quit 5 years ago)/EtOH (clean for 14 months) as of 10/2011    Patient Active Problem List   Diagnosis Date Noted   Hyperlipidemia 11/30/2020   Persistent atrial fibrillation (HCC)    Paroxysmal atrial fibrillation (HCC) 05/29/2019   Substance abuse (HCC)    Diastolic CHF, chronic    Diastolic CHF, chronic (HCC) 10/24/2011   Atrial flutter (HCC) 10/22/2011   Community acquired bacterial pneumonia 10/21/2011   SOB (shortness of breath) 10/21/2011   N&V (nausea and vomiting) 10/21/2011   ARF (acute renal failure) (HCC) 10/21/2011   Tobacco abuse 10/21/2011   Leukocytosis 10/21/2011   Fever 10/21/2011    Past Surgical History:  Procedure Laterality Date   CARDIOVERSION N/A 10/29/2020   Procedure: CARDIOVERSION;  Surgeon: Nahser, Deloris Ping, MD;  Location: [facility];  Service: Cardiovascular;  Laterality: N/A;       Home Medications    Prior to Admission medications   Medication Sig Start Date End Date Taking? Authorizing Provider  apixaban (ELIQUIS) 5 MG TABS tablet Take 1 tablet (5 mg total) by mouth 2 (two) times daily. 03/25/21  Yes Fenton,  Clint R, PA  atorvastatin (LIPITOR) 40 MG tablet TAKE 1 TABLET BY MOUTH  DAILY 05/23/21  Yes Georganna Skeans, MD  dapagliflozin propanediol (FARXIGA) 5 MG TABS tablet Take 1 tablet (5 mg total) by mouth daily before breakfast. 05/06/21  Yes Zonia Kief, Amy J, NP  diltiazem (CARDIZEM CD) 120 MG 24 hr capsule Take 1 capsule (120 mg total) by mouth 2 (two) times daily. 03/25/21  Yes Fenton, Clint R, PA  flecainide (TAMBOCOR) 100 MG tablet Take 1 tablet (100 mg total) by mouth 2 (two) times daily. 03/25/21  Yes Fenton, Clint R, PA  sitaGLIPtin (JANUVIA) 25 MG tablet Take 1 tablet (25 mg total) by mouth daily. 05/06/21  Yes Zonia Kief, Amy J, NP  acetaminophen (TYLENOL) 500 MG tablet Take 1,000 mg by mouth every 6 (six) hours as needed for mild pain, moderate pain or headache.    [provider]  benzonatate (TESSALON PERLES) 100 MG capsule Take 1 capsule (100 mg total) by mouth 3 (three) times daily as needed for cough. Patient not taking: Reported on 05/06/2021 03/28/21   Rema Fendt, NP  cetirizine (ZYRTEC) 10 MG tablet Take 10 mg by mouth daily as needed for allergies. Patient not taking: Reported on 05/06/2021    [provider]  menthol-cetylpyridinium (CEPACOL) 3 MG lozenge Take 1 lozenge (3 mg total) by mouth every 2 (two) hours as  needed for sore throat. Patient not taking: Reported on 05/06/2021 03/28/21   Camillia Herter, NP    Family History Family History  Problem Relation Age of Onset   Heart disease Neg Hx     Social History Social History   Tobacco Use   Smoking status: Former    Types: Cigarettes   Smokeless tobacco: Never   Tobacco comments:    Quit April 2013  Vaping Use   Vaping Use: Never used  Substance Use Topics   Alcohol use: Not Currently   Drug use: Not Currently     Allergies   Metformin and related   Review of Systems Review of Systems  Constitutional:  Negative for fever.  HENT:  Positive for congestion and sore throat. Negative for  ear pain.   Eyes:  Negative for discharge and redness.  Respiratory:  Positive for cough. Negative for shortness of breath.   Gastrointestinal:  Negative for abdominal pain, nausea and vomiting.    Physical Exam Triage Vital Signs ED Triage Vitals  Enc Vitals Group     BP      Pulse      Resp      Temp      Temp src      SpO2      Weight      Height      Head Circumference      Peak Flow      Pain Score      Pain Loc      Pain Edu?      Excl. in [place]?    No data found.  Updated Vital Signs BP 134/73   Pulse 74   Temp 98.2 F (36.8 C) (Oral)   Resp 16   SpO2 94%      Physical Exam Vitals and nursing note reviewed.  Constitutional:      General: He is not in acute distress.    Appearance: Normal appearance. He is not ill-appearing.  HENT:     Head: Normocephalic and atraumatic.     Nose: Nose normal. No congestion.     Mouth/Throat:     Mouth: Mucous membranes are moist.     Pharynx: Oropharynx is clear. No oropharyngeal exudate or posterior oropharyngeal erythema.  Eyes:     Conjunctiva/sclera: Conjunctivae normal.  Cardiovascular:     Rate and Rhythm: Normal rate and regular rhythm.     Heart sounds: Normal heart sounds. No murmur heard. Pulmonary:     Effort: Pulmonary effort is normal. No respiratory distress.     Breath sounds: Normal breath sounds. No wheezing, rhonchi or rales.  Skin:    General: Skin is warm and dry.  Neurological:     Mental Status: He is alert.  Psychiatric:        Mood and Affect: Mood normal.        Thought Content: Thought content normal.     UC Treatments / Results  Labs (all labs ordered are listed, but only abnormal results are displayed) Labs Reviewed  COVID-19, FLU A+B NAA  POCT INFLUENZA A/B    EKG   Radiology No results found.  Procedures Procedures (including critical care time)  Medications Ordered in UC Medications - No data to display  Initial Impression / Assessment and Plan / UC Course  I have  reviewed the triage vital signs and the nursing notes.  Pertinent labs & imaging results that were available during my care of the patient were reviewed by  me and considered in my medical decision making (see chart for details).    Test negative in office.  Will screen for COVID.  Recommend symptomatic treatment in the meantime.  Encouraged follow-up if symptoms fail to improve or worsen  Final Clinical Impressions(s) / UC Diagnoses   Final diagnoses:  Acute cough  Encounter for laboratory testing for COVID-19 virus  Acute upper respiratory infection   Discharge Instructions   None    ED Prescriptions   None    PDMP not reviewed this encounter.   Francene Finders, PA-C 06/04/21 1203

## 2021-06-05 LAB — COVID-19, FLU A+B NAA
Influenza A, NAA: NOT DETECTED
Influenza B, NAA: NOT DETECTED
SARS-CoV-2, NAA: NOT DETECTED

## 2021-06-16 ENCOUNTER — Other Ambulatory Visit (HOSPITAL_COMMUNITY): Payer: Self-pay | Admitting: *Deleted

## 2021-06-16 ENCOUNTER — Other Ambulatory Visit: Payer: Self-pay

## 2021-06-16 DIAGNOSIS — E785 Hyperlipidemia, unspecified: Secondary | ICD-10-CM

## 2021-06-16 DIAGNOSIS — E119 Type 2 diabetes mellitus without complications: Secondary | ICD-10-CM

## 2021-06-16 MED ORDER — DAPAGLIFLOZIN PROPANEDIOL 5 MG PO TABS: 5.0000 mg | ORAL_TABLET | Freq: Every day | ORAL | 0 refills | Status: DC

## 2021-06-16 MED ORDER — ATORVASTATIN CALCIUM 40 MG PO TABS: 40.0000 mg | ORAL_TABLET | Freq: Every day | ORAL | 0 refills | Status: DC

## 2021-06-16 MED ORDER — DILTIAZEM HCL ER COATED BEADS 120 MG PO CP24: 120.0000 mg | ORAL_CAPSULE | Freq: Two times a day (BID) | ORAL | 2 refills | Status: DC

## 2021-06-16 MED ORDER — FLECAINIDE ACETATE 100 MG PO TABS: 100.0000 mg | ORAL_TABLET | Freq: Two times a day (BID) | ORAL | 2 refills | Status: DC

## 2021-06-16 MED ORDER — APIXABAN 5 MG PO TABS: 5.0000 mg | ORAL_TABLET | Freq: Two times a day (BID) | ORAL | 2 refills | Status: DC

## 2021-06-16 MED ORDER — SITAGLIPTIN PHOSPHATE 25 MG PO TABS: 25.0000 mg | ORAL_TABLET | Freq: Every day | ORAL | 0 refills | Status: DC

## 2021-07-31 NOTE — Progress Notes (Signed)
Patient ID: [name] [name], male    DOB: 12-05-1965  MRN: [id number]  CC: Diabetes Follow-Up   Subjective: [name] [name] is a 56 y.o. male who presents for diabetes follow-up.   His concerns today include:  DIABETES TYPE 2 FOLLOW-UP: 05/06/2021: - Continue Sitagliptin and Dapagliflozin Propanediol as prescribed.   08/05/2021: Doing well on current regimen. No issues/concerns. Reports not checking blood sugars as he should. Reports needs to improve diet. He is routinely exercising.  Patient Active Problem List   Diagnosis Date Noted   Hyperlipidemia 11/30/2020   Persistent atrial fibrillation (HCC)    Paroxysmal atrial fibrillation (HCC) 05/29/2019   Substance abuse (HCC)    Diastolic CHF, chronic    Diastolic CHF, chronic (HCC) 10/24/2011   Atrial flutter (HCC) 10/22/2011   Community acquired bacterial pneumonia 10/21/2011   SOB (shortness of breath) 10/21/2011   N&V (nausea and vomiting) 10/21/2011   ARF (acute renal failure) (HCC) 10/21/2011   Tobacco abuse 10/21/2011   Leukocytosis 10/21/2011   Fever 10/21/2011     Current Outpatient Medications on File Prior to Visit  Medication Sig Dispense Refill   acetaminophen (TYLENOL) 500 MG tablet Take 1,000 mg by mouth every 6 (six) hours as needed for mild pain, moderate pain or headache.     apixaban (ELIQUIS) 5 MG TABS tablet Take 1 tablet (5 mg total) by mouth 2 (two) times daily. 180 tablet 2   atorvastatin (LIPITOR) 40 MG tablet Take 1 tablet (40 mg total) by mouth daily. 90 tablet 0   diltiazem (CARDIZEM CD) 120 MG 24 hr capsule Take 1 capsule (120 mg total) by mouth 2 (two) times daily. 180 capsule 2   flecainide (TAMBOCOR) 100 MG tablet Take 1 tablet (100 mg total) by mouth 2 (two) times daily. 180 tablet 2   sitaGLIPtin (JANUVIA) 25 MG tablet Take 1 tablet (25 mg total) by mouth daily. 90 tablet 0   No current facility-administered medications on file prior to visit.    Allergies  Allergen Reactions    Metformin And Related Other (See Comments)    Pt endorses blurred vision with metformin use.     Social History   Socioeconomic History   Marital status: Married    Spouse name: Not on file   Number of children: Not on file   Years of education: Not on file   Highest education level: Not on file  Occupational History   Not on file  Tobacco Use   Smoking status: Former    Types: Cigarettes   Smokeless tobacco: Never   Tobacco comments:    Quit April 2013  Vaping Use   Vaping Use: Never used  Substance and Sexual Activity   Alcohol use: Not Currently   Drug use: Not Currently   Sexual activity: Not on file  Other Topics Concern   Not on file  Social History Narrative   Not on file   Social Determinants of Health   Financial Resource Strain: Not on file  Food Insecurity: Not on file  Transportation Needs: Not on file  Physical Activity: Not on file  Stress: Not on file  Social Connections: Not on file  Intimate Partner Violence: Not on file    Family History  Problem Relation Age of Onset   Heart disease Neg Hx     Past Surgical History:  Procedure Laterality Date   CARDIOVERSION N/A 10/29/2020   Procedure: CARDIOVERSION;  Surgeon: Nahser, Deloris Ping, MD;  Location: [facility] ENDOSCOPY;  Service: Cardiovascular;  Laterality: N/A;    ROS: Review of Systems Negative except as stated above  PHYSICAL EXAM: BP 125/77 (BP Location: Left Arm, Patient Position: Sitting, Cuff Size: Large)    Pulse 71    Temp 98 F (36.7 C)    Resp 18    Ht 5\' 10"  (1.778 m)    Wt 236 lb (107 kg)    SpO2 98%    BMI 33.86 kg/m   Physical Exam HENT:     Head: Normocephalic and atraumatic.  Eyes:     Extraocular Movements: Extraocular movements intact.     Conjunctiva/sclera: Conjunctivae normal.     Pupils: Pupils are equal, round, and reactive to light.  Cardiovascular:     Rate and Rhythm: Normal rate and regular rhythm.     Pulses: Normal pulses.     Heart sounds: Normal heart sounds.  No murmur heard. Pulmonary:     Effort: Pulmonary effort is normal.     Breath sounds: Normal breath sounds.  Musculoskeletal:     Cervical back: Normal range of motion and neck supple.  Neurological:     General: No focal deficit present.     Mental Status: He is alert and oriented to person, place, and time.  Psychiatric:        Mood and Affect: Mood normal.        Behavior: Behavior normal.   Results for orders placed or performed in visit on 08/05/21  POCT glycosylated hemoglobin (Hb A1C)  Result Value Ref Range   Hemoglobin A1C 7.3 (A) 4.0 - 5.6 %   HbA1c POC (<> result, manual entry)     HbA1c, POC (prediabetic range)     HbA1c, POC (controlled diabetic range)       ASSESSMENT AND PLAN: 1. Type 2 diabetes mellitus without complication, without long-term current use of insulin (HCC): - Hemoglobin A1c today not at goal at 7.3%, goal < 7%. This is increased from previous 6.8% on 02/04/2021. - Patient endorses dietary indiscretion.  - Continue Sitagliptin as prescribed. No refills needed as of present.  - Increase Dapagliflozin Propanediol from 5 mg daily to 10 mg daily.  - Discussed the importance of healthy eating habits, low-carbohydrate diet, low-sugar diet, regular aerobic exercise (at least 150 minutes a week as tolerated) and medication compliance to achieve or maintain control of diabetes. - Follow-up with primary provider in 3 months or sooner if needed.  - POCT glycosylated hemoglobin (Hb A1C) - dapagliflozin propanediol (FARXIGA) 10 MG TABS tablet; Take 1 tablet (10 mg total) by mouth daily before breakfast.  Dispense: 90 tablet; Refill: 0   Patient was given the opportunity to ask questions.  Patient verbalized understanding of the plan and was able to repeat key elements of the plan. Patient was given clear instructions to go to Emergency Department or return to medical center if symptoms don't improve, worsen, or new problems develop.The patient verbalized  understanding.   Orders Placed This Encounter  Procedures   POCT glycosylated hemoglobin (Hb A1C)    Requested Prescriptions   Signed Prescriptions Disp Refills   dapagliflozin propanediol (FARXIGA) 10 MG TABS tablet 90 tablet 0    Sig: Take 1 tablet (10 mg total) by mouth daily before breakfast.    Return in about 3 months (around 11/03/2021) for Follow-Up or next available diabetes .  11/05/2021, NP

## 2021-08-05 ENCOUNTER — Ambulatory Visit (INDEPENDENT_AMBULATORY_CARE_PROVIDER_SITE_OTHER): Payer: BC Managed Care – PPO | Admitting: Family

## 2021-08-05 ENCOUNTER — Other Ambulatory Visit: Payer: Self-pay

## 2021-08-05 ENCOUNTER — Encounter: Payer: Self-pay | Admitting: Family

## 2021-08-05 VITALS — BP 125/77 | HR 71 | Temp 98.0°F | Resp 18 | Ht 70.0 in | Wt 236.0 lb

## 2021-08-05 DIAGNOSIS — E119 Type 2 diabetes mellitus without complications: Secondary | ICD-10-CM

## 2021-08-05 LAB — POCT GLYCOSYLATED HEMOGLOBIN (HGB A1C): Hemoglobin A1C: 7.3 % — AB (ref 4.0–5.6)

## 2021-08-05 MED ORDER — DAPAGLIFLOZIN PROPANEDIOL 10 MG PO TABS: 10.0000 mg | ORAL_TABLET | Freq: Every day | ORAL | 0 refills | Status: DC

## 2021-08-05 NOTE — Progress Notes (Signed)
Diabetes discussed in office.

## 2021-08-05 NOTE — Progress Notes (Signed)
Pt presents for diabetes follow-up  

## 2021-08-10 ENCOUNTER — Other Ambulatory Visit: Payer: Self-pay | Admitting: Family

## 2021-08-10 DIAGNOSIS — E785 Hyperlipidemia, unspecified: Secondary | ICD-10-CM

## 2021-08-10 DIAGNOSIS — E119 Type 2 diabetes mellitus without complications: Secondary | ICD-10-CM

## 2021-08-12 ENCOUNTER — Other Ambulatory Visit: Payer: Self-pay

## 2021-08-12 ENCOUNTER — Ambulatory Visit (HOSPITAL_COMMUNITY)
Admission: RE | Admit: 2021-08-12 | Discharge: 2021-08-12 | Disposition: A | Discharge status: Home / Self Care | Payer: BC Managed Care – PPO | Source: Ambulatory Visit | Attending: Physician Assistant | Admitting: Physician Assistant

## 2021-08-12 ENCOUNTER — Encounter (HOSPITAL_COMMUNITY): Payer: Self-pay | Admitting: Physician Assistant

## 2021-08-12 VITALS — BP 138/70 | HR 72 | Ht 70.0 in | Wt 238.8 lb

## 2021-08-12 DIAGNOSIS — Z8616 Personal history of COVID-19: Secondary | ICD-10-CM | POA: Insufficient documentation

## 2021-08-12 DIAGNOSIS — Z79899 Other long term (current) drug therapy: Secondary | ICD-10-CM | POA: Insufficient documentation

## 2021-08-12 DIAGNOSIS — Z91199 Patient's noncompliance with other medical treatment and regimen due to unspecified reason: Secondary | ICD-10-CM | POA: Insufficient documentation

## 2021-08-12 DIAGNOSIS — Z720 Tobacco use: Secondary | ICD-10-CM | POA: Insufficient documentation

## 2021-08-12 DIAGNOSIS — E119 Type 2 diabetes mellitus without complications: Secondary | ICD-10-CM | POA: Diagnosis not present

## 2021-08-12 DIAGNOSIS — Z7901 Long term (current) use of anticoagulants: Secondary | ICD-10-CM | POA: Insufficient documentation

## 2021-08-12 DIAGNOSIS — I110 Hypertensive heart disease with heart failure: Secondary | ICD-10-CM | POA: Diagnosis not present

## 2021-08-12 DIAGNOSIS — E669 Obesity, unspecified: Secondary | ICD-10-CM | POA: Diagnosis not present

## 2021-08-12 DIAGNOSIS — Z7182 Exercise counseling: Secondary | ICD-10-CM | POA: Insufficient documentation

## 2021-08-12 DIAGNOSIS — G4733 Obstructive sleep apnea (adult) (pediatric): Secondary | ICD-10-CM | POA: Insufficient documentation

## 2021-08-12 DIAGNOSIS — I483 Typical atrial flutter: Secondary | ICD-10-CM | POA: Diagnosis not present

## 2021-08-12 DIAGNOSIS — I480 Paroxysmal atrial fibrillation: Secondary | ICD-10-CM | POA: Insufficient documentation

## 2021-08-12 DIAGNOSIS — I5032 Chronic diastolic (congestive) heart failure: Secondary | ICD-10-CM | POA: Diagnosis not present

## 2021-08-12 DIAGNOSIS — Z6834 Body mass index (BMI) 34.0-34.9, adult: Secondary | ICD-10-CM | POA: Diagnosis not present

## 2021-08-12 NOTE — Progress Notes (Signed)
Primary Care Physician: Camillia Herter, NP Primary Cardiologist: Dr Meda Coffee (remotely) Primary Electrophysiologist: none Referring Physician: Ian Malkin [facility]   [name] [name] is a 56 y.o. male with a history of paroxysmal afib, atrial flutter, diastolic CHF, OSA, DM, and tobacco use. He was initially diagnosed with afib in 2013 in the setting of CAP. He spontaneously converted back to SR. Unfortunately, patient tested positive for COVID-19 and was found to be in atrial flutter and later in afib at the ER. He converted to SR after diltiazem drip. He was asymptomatic during the episode. He denies any alcohol use.   Patient has had multiple ED visits for various reasons. He presented to the ED on 10/07/20 for cough and sore throat and was incidentally found to be in rapid atrial flutter. He underwent DCCV at that time. He does admit to taking psuedophedrine before the onset of his symptoms. He was back in rapid flutter on follow up and started on flecainide. Patient is s/p DCCV 10/29/20.   On follow up today, patient reports that he has done well from a cardiac standpoint. He denies any heart racing or palpitations. He is tolerating the medications without difficulty.   Today, he denies symptoms of palpitations, chest pain, shortness of breath, orthopnea, PND, lower extremity edema, dizziness, bleeding, or neurologic sequela. The patient is tolerating medications without difficulties and is otherwise without complaint today.    Atrial Fibrillation Risk Factors:  he does have symptoms or diagnosis of sleep apnea. He is not on CPAP therapy. he does have a history of alcohol use. Does not drink currently.   he has a BMI of Body mass index is 34.26 kg/m.Marland Kitchen Filed Weights   08/12/21 1017  Weight: 108.3 kg      Family History  Problem Relation Age of Onset   Heart disease Neg Hx      Atrial Fibrillation Management history:  Previous antiarrhythmic drugs: flecainide  Previous  cardioversions: 10/29/20 Previous ablations: none CHADS2VASC score: 1 Anticoagulation history: Eliquis   Past Medical History:  Diagnosis Date   ARF (acute renal failure) (HCC)    Atrial flutter (HCC)    Community acquired bacterial pneumonia    Diastolic CHF, chronic (HCC)    Leukocytosis    N&V (nausea and vomiting)    SOB (shortness of breath)    Substance abuse ([place])    Prior hx of cocaine (quit 5 years ago)/EtOH (clean for 14 months) as of 10/2011   Past Surgical History:  Procedure Laterality Date   CARDIOVERSION N/A 10/29/2020   Procedure: CARDIOVERSION;  Surgeon: Nahser, Wonda Cheng, MD;  Location: [facility] ENDOSCOPY;  Service: Cardiovascular;  Laterality: N/A;    Current Outpatient Medications  Medication Sig Dispense Refill   acetaminophen (TYLENOL) 500 MG tablet Take 1,000 mg by mouth every 6 (six) hours as needed for mild pain, moderate pain or headache.     apixaban (ELIQUIS) 5 MG TABS tablet Take 1 tablet (5 mg total) by mouth 2 (two) times daily. 180 tablet 2   atorvastatin (LIPITOR) 40 MG tablet Take 1 tablet (40 mg total) by mouth daily. 90 tablet 0   dapagliflozin propanediol (FARXIGA) 10 MG TABS tablet Take 1 tablet (10 mg total) by mouth daily before breakfast. 90 tablet 0   diltiazem (CARDIZEM CD) 120 MG 24 hr capsule Take 1 capsule (120 mg total) by mouth 2 (two) times daily. 180 capsule 2   flecainide (TAMBOCOR) 100 MG tablet Take 1 tablet (100 mg total) by mouth 2 (  two) times daily. 180 tablet 2   HYDROcodone-acetaminophen (NORCO/VICODIN) 5-325 MG tablet Take 1 tablet by mouth every 4 (four) hours as needed.     sitaGLIPtin (JANUVIA) 25 MG tablet Take 1 tablet (25 mg total) by mouth daily. 90 tablet 0   No current facility-administered medications for this encounter.    Allergies  Allergen Reactions   Metformin And Related Other (See Comments)    Pt endorses blurred vision with metformin use.     Social History   Socioeconomic History   Marital status:  Married    Spouse name: Not on file   Number of children: Not on file   Years of education: Not on file   Highest education level: Not on file  Occupational History   Not on file  Tobacco Use   Smoking status: Former    Types: Cigarettes   Smokeless tobacco: Never   Tobacco comments:    Former Smoker Quit April 2013 08/12/21  Vaping Use   Vaping Use: Never used  Substance and Sexual Activity   Alcohol use: Not Currently   Drug use: Not Currently   Sexual activity: Not on file  Other Topics Concern   Not on file  Social History Narrative   Not on file   Social Determinants of Health   Financial Resource Strain: Not on file  Food Insecurity: Not on file  Transportation Needs: Not on file  Physical Activity: Not on file  Stress: Not on file  Social Connections: Not on file  Intimate Partner Violence: Not on file     ROS- All systems are reviewed and negative except as per the HPI above.  Physical Exam: Vitals:   08/12/21 1017  BP: 138/70  Pulse: 72  Weight: 108.3 kg  Height: 5\' 10"  (1.778 m)    GEN- The patient is a well appearing obese male, alert and oriented x 3 today.   HEENT-head normocephalic, atraumatic, sclera clear, conjunctiva pink, hearing intact, trachea midline. Lungs- Clear to ausculation bilaterally, normal work of breathing Heart- Regular rate and rhythm, no murmurs, rubs or gallops  GI- soft, NT, ND, + BS Extremities- no clubbing, cyanosis, or edema MS- no significant deformity or atrophy Skin- no rash or lesion Psych- euthymic mood, full affect Neuro- strength and sensation are intact   Wt Readings from Last 3 Encounters:  08/12/21 108.3 kg  08/05/21 107 kg  05/06/21 102.1 kg    EKG today demonstrates  SR Vent. rate 72 BPM PR interval 160 ms QRS duration 92 ms QT/QTcB 370/405 ms  Echo 11/19/20 demonstrated   1. Left ventricular ejection fraction, by estimation, is 55 to 60%. The  left ventricle has normal function. The left  ventricle demonstrates global hypokinesis. Left ventricular diastolic parameters were normal.   2. Right ventricular systolic function is normal. The right ventricular  size is normal. Tricuspid regurgitation signal is inadequate for assessing PA pressure.   3. The mitral valve is grossly normal. Trivial mitral valve  regurgitation.   4. The aortic valve is tricuspid. Aortic valve regurgitation is not  visualized.   5. The inferior vena cava is dilated in size with >50% respiratory  variability, suggesting right atrial pressure of 8 mmHg.   Comparison(s): Prior images unable to be directly viewed, comparison made by report only. No significant change from prior study. 10/23/2011: LVEF 55-60%.    Epic records are reviewed at length today  Assessment and Plan:  1. Paroxysmal atrial fibrillation/typical atrial flutter Patient appears to be maintaining  SR. Continue flecainide 100 mg BID Continue diltiazem 120 mg BID Continue Eliquis 5 mg BID  This patients CHA2DS2-VASc Score and unadjusted Ischemic Stroke Rate (% per year) is equal to 0.6 % stroke rate/year from a score of 1  Above score calculated as 1 point each if present [CHF, HTN, DM, Vascular=MI/PAD/Aortic Plaque, Age if 65-74, or Male] Above score calculated as 2 points each if present [Age > 75, or Stroke/TIA/TE]  2. Obesity Body mass index is 34.26 kg/m. Lifestyle modification was discussed and encouraged including regular physical activity and weight reduction.  3. Severe OSA He is not compliant with CPAP therapy. He inquires about Inspire device today. Unlikely candidate given he has never tried CPAP. CSW information given again today to assist him in getting a CPAP.    Follow up in the AF clinic in 6 months.    [facility] [address] [place], [place] [address] [phone] 08/12/2021 11:00 AM

## 2021-08-12 NOTE — Patient Instructions (Signed)
Burna Sis, LCSW Clinical Social Worker  Desk#: [phone] Cell#: [phone]

## 2021-09-09 ENCOUNTER — Other Ambulatory Visit (HOSPITAL_COMMUNITY): Payer: Self-pay | Admitting: *Deleted

## 2021-09-09 MED ORDER — FLECAINIDE ACETATE 100 MG PO TABS: 100.0000 mg | ORAL_TABLET | Freq: Two times a day (BID) | ORAL | 2 refills | Status: DC

## 2021-09-23 ENCOUNTER — Telehealth: Payer: Self-pay | Admitting: Family

## 2021-09-23 ENCOUNTER — Other Ambulatory Visit: Payer: Self-pay

## 2021-09-23 DIAGNOSIS — E785 Hyperlipidemia, unspecified: Secondary | ICD-10-CM

## 2021-09-23 DIAGNOSIS — E119 Type 2 diabetes mellitus without complications: Secondary | ICD-10-CM

## 2021-09-23 MED ORDER — DAPAGLIFLOZIN PROPANEDIOL 10 MG PO TABS: 10.0000 mg | ORAL_TABLET | Freq: Every day | ORAL | 0 refills | Status: DC

## 2021-09-23 MED ORDER — SITAGLIPTIN PHOSPHATE 25 MG PO TABS: 25.0000 mg | ORAL_TABLET | Freq: Every day | ORAL | 0 refills | Status: DC

## 2021-09-23 MED ORDER — ATORVASTATIN CALCIUM 40 MG PO TABS: 40.0000 mg | ORAL_TABLET | Freq: Every day | ORAL | 0 refills | Status: DC

## 2021-09-23 NOTE — Telephone Encounter (Signed)
Completed.

## 2021-09-23 NOTE — Telephone Encounter (Signed)
dapagliflozin propanediol (FARXIGA) 10 MG TABS tablet [id number]  ? ?sitaGLIPtin (JANUVIA) 25 MG tablet [id number]  ? ?atorvastatin (LIPITOR) 40 MG tablet [id number]  ? ? ?[facility] ([place], [place] ? ?

## 2021-11-04 ENCOUNTER — Ambulatory Visit: Payer: BC Managed Care – PPO | Admitting: Family

## 2021-11-17 ENCOUNTER — Other Ambulatory Visit: Payer: Self-pay | Admitting: Family

## 2021-11-17 DIAGNOSIS — E785 Hyperlipidemia, unspecified: Secondary | ICD-10-CM

## 2021-11-17 DIAGNOSIS — E119 Type 2 diabetes mellitus without complications: Secondary | ICD-10-CM

## 2021-11-17 NOTE — Progress Notes (Signed)
Patient ID: [name] [name], male    DOB: 11/24/1965  MRN: [id number]  CC: Chronic Conditions Follow-Up  Subjective: [name] [name] is a 56 y.o. male who presents for chronic conditions follow-up.   His concerns today include:  Diabetes type 2 follow-up: 08/05/2021: - Hemoglobin A1c today not at goal at 7.3%, goal < 7%. This is increased from previous 6.8% on 02/04/2021. - Patient endorses dietary indiscretion.  - Continue Sitagliptin as prescribed.  - Increase Dapagliflozin Propanediol from 5 mg daily to 10 mg daily.   11/25/2021: Doing well on current regimen. No side effects. No issues/concerns. Trying to monitor what he eats. Choosing 0 products and sugar free products. Does have cake sometimes. Eating carbohydrates in moderation. Wife checking blood sugars at home sometimes. Exercising routinely.  2. Hyperlipidemia follow-up: Doing well on Atorvastatin.  3. Erectile dysfunction: Would like to begin Cialis or Viagra. Plans to confirm with his cardiologist if this is ok and update primary provider once he receives a reply.    Patient Active Problem List   Diagnosis Date Noted   Hyperlipidemia 11/30/2020   Persistent atrial fibrillation (HCC)    Paroxysmal atrial fibrillation (HCC) 05/29/2019   Substance abuse (HCC)    Diastolic CHF, chronic    Diastolic CHF, chronic (HCC) 10/24/2011   Atrial flutter (HCC) 10/22/2011   Community acquired bacterial pneumonia 10/21/2011   SOB (shortness of breath) 10/21/2011   N&V (nausea and vomiting) 10/21/2011   ARF (acute renal failure) (HCC) 10/21/2011   Tobacco abuse 10/21/2011   Leukocytosis 10/21/2011   Fever 10/21/2011     Current Outpatient Medications on File Prior to Visit  Medication Sig Dispense Refill   acetaminophen (TYLENOL) 500 MG tablet Take 1,000 mg by mouth every 6 (six) hours as needed for mild pain, moderate pain or headache.     apixaban (ELIQUIS) 5 MG TABS tablet Take 1 tablet (5 mg total) by mouth 2 (two) times  daily. 180 tablet 2   atorvastatin (LIPITOR) 40 MG tablet TAKE 1 TABLET BY MOUTH DAILY 90 tablet 3   diltiazem (CARDIZEM CD) 120 MG 24 hr capsule Take 1 capsule (120 mg total) by mouth 2 (two) times daily. 180 capsule 2   flecainide (TAMBOCOR) 100 MG tablet Take 1 tablet (100 mg total) by mouth 2 (two) times daily. 180 tablet 2   HYDROcodone-acetaminophen (NORCO/VICODIN) 5-325 MG tablet Take 1 tablet by mouth every 4 (four) hours as needed.     JANUVIA 25 MG tablet TAKE 1 TABLET BY MOUTH DAILY 90 tablet 3   No current facility-administered medications on file prior to visit.    Allergies  Allergen Reactions   Metformin And Related Other (See Comments)    Pt endorses blurred vision with metformin use.     Social History   Socioeconomic History   Marital status: Married    Spouse name: Not on file   Number of children: Not on file   Years of education: Not on file   Highest education level: Not on file  Occupational History   Not on file  Tobacco Use   Smoking status: Former    Types: Cigarettes    Passive exposure: Past   Smokeless tobacco: Never   Tobacco comments:    Former Smoker Quit April 2013 08/12/21  Vaping Use   Vaping Use: Never used  Substance and Sexual Activity   Alcohol use: Not Currently   Drug use: Not Currently   Sexual activity: Not on file  Other Topics Concern  Not on file  Social History Narrative   Not on file   Social Determinants of Health   Financial Resource Strain: Not on file  Food Insecurity: Not on file  Transportation Needs: Not on file  Physical Activity: Not on file  Stress: Not on file  Social Connections: Not on file  Intimate Partner Violence: Not on file    Family History  Problem Relation Age of Onset   Heart disease Neg Hx     Past Surgical History:  Procedure Laterality Date   CARDIOVERSION N/A 10/29/2020   Procedure: CARDIOVERSION;  Surgeon: Nahser, Deloris Ping, MD;  Location: [facility] ENDOSCOPY;  Service: Cardiovascular;   Laterality: N/A;    ROS: Review of Systems Negative except as stated above  PHYSICAL EXAM: BP 123/70 (BP Location: Left Arm, Patient Position: Sitting, Cuff Size: Large)   Pulse 73   Temp 98.3 F (36.8 C)   Resp 18   Ht 5\' 10"  (1.778 m)   Wt 235 lb (106.6 kg)   SpO2 95%   BMI 33.72 kg/m   Physical Exam HENT:     Head: Normocephalic and atraumatic.  Eyes:     Extraocular Movements: Extraocular movements intact.     Conjunctiva/sclera: Conjunctivae normal.     Pupils: Pupils are equal, round, and reactive to light.  Cardiovascular:     Rate and Rhythm: Normal rate and regular rhythm.     Pulses: Normal pulses.     Heart sounds: Normal heart sounds.  Pulmonary:     Effort: Pulmonary effort is normal.     Breath sounds: Normal breath sounds.  Musculoskeletal:     Cervical back: Normal range of motion and neck supple.  Neurological:     General: No focal deficit present.     Mental Status: He is alert and oriented to person, place, and time.  Psychiatric:        Mood and Affect: Mood normal.        Behavior: Behavior normal.   Results for orders placed or performed in visit on 11/25/21  POCT glycosylated hemoglobin (Hb A1C)  Result Value Ref Range   Hemoglobin A1C 7.2 (A) 4.0 - 5.6 %   HbA1c POC (<> result, manual entry)     HbA1c, POC (prediabetic range)     HbA1c, POC (controlled diabetic range)      ASSESSMENT AND PLAN: 1. Type 2 diabetes mellitus without complication, without long-term current use of insulin (HCC): - Hemoglobin A1c relative to goal at 7.2%, goal 7%.  - Continue Sitagliptin as prescribed. No refills needed as of present.  - Continue Dapagliflozin Propanediol as prescribed.  - Discussed the importance of healthy eating habits, low-carbohydrate diet, low-sugar diet, regular aerobic exercise (at least 150 minutes a week as tolerated) and medication compliance to achieve or maintain control of diabetes. - Update BMP.  - Follow-up with primary  provider in 4 months or sooner if needed.  - Basic Metabolic Panel - POCT glycosylated hemoglobin (Hb A1C) - dapagliflozin propanediol (FARXIGA) 10 MG TABS tablet; Take 1 tablet (10 mg total) by mouth daily before breakfast.  Dispense: 120 tablet; Refill: 0  2. Hyperlipidemia, unspecified hyperlipidemia type: - Continue Atorvastatin as prescribed. No refills needed as of present.  - Update lipid panel.  - Lipid panel  3. Erectile dysfunction, unspecified erectile dysfunction type: - Patient would like to begin Cialis or Viagra. Plans to confirm with his cardiologist if this is ok and update primary provider once he receives a reply. I  will confirm the same after review in Epic.   Patient was given the opportunity to ask questions.  Patient verbalized understanding of the plan and was able to repeat key elements of the plan. Patient was given clear instructions to go to Emergency Department or return to medical center if symptoms don't improve, worsen, or new problems develop.The patient verbalized understanding.   Orders Placed This Encounter  Procedures   Basic Metabolic Panel   Lipid panel   POCT glycosylated hemoglobin (Hb A1C)     Requested Prescriptions   Signed Prescriptions Disp Refills   dapagliflozin propanediol (FARXIGA) 10 MG TABS tablet 120 tablet 0    Sig: Take 1 tablet (10 mg total) by mouth daily before breakfast.    Return in about 4 months (around 03/28/2022) for Follow-Up or next available DM .  Rema Fendt, NP

## 2021-11-25 ENCOUNTER — Encounter: Payer: Self-pay | Admitting: Family

## 2021-11-25 ENCOUNTER — Ambulatory Visit (INDEPENDENT_AMBULATORY_CARE_PROVIDER_SITE_OTHER): Payer: BC Managed Care – PPO | Admitting: Family

## 2021-11-25 VITALS — BP 123/70 | HR 73 | Temp 98.3°F | Resp 18 | Ht 70.0 in | Wt 235.0 lb

## 2021-11-25 DIAGNOSIS — E119 Type 2 diabetes mellitus without complications: Secondary | ICD-10-CM | POA: Diagnosis not present

## 2021-11-25 DIAGNOSIS — N529 Male erectile dysfunction, unspecified: Secondary | ICD-10-CM

## 2021-11-25 DIAGNOSIS — E785 Hyperlipidemia, unspecified: Secondary | ICD-10-CM

## 2021-11-25 LAB — POCT GLYCOSYLATED HEMOGLOBIN (HGB A1C): Hemoglobin A1C: 7.2 % — AB (ref 4.0–5.6)

## 2021-11-25 MED ORDER — DAPAGLIFLOZIN PROPANEDIOL 10 MG PO TABS: 10.0000 mg | ORAL_TABLET | Freq: Every day | ORAL | 0 refills | Status: DC

## 2021-11-25 NOTE — Progress Notes (Signed)
Pt presents for chronic conditions mgmt

## 2021-11-25 NOTE — Progress Notes (Signed)
Diabetes discussed in office.

## 2021-11-26 LAB — BASIC METABOLIC PANEL
BUN/Creatinine Ratio: 10 (ref 9–20)
BUN: 12 mg/dL (ref 6–24)
CO2: 22 mmol/L (ref 20–29)
Calcium: 9.5 mg/dL (ref 8.7–10.2)
Chloride: 104 mmol/L (ref 96–106)
Creatinine, Ser: 1.25 mg/dL (ref 0.76–1.27)
Glucose: 100 mg/dL — ABNORMAL HIGH (ref 70–99)
Potassium: 4.5 mmol/L (ref 3.5–5.2)
Sodium: 140 mmol/L (ref 134–144)
eGFR: 69 mL/min/{1.73_m2} (ref >59)

## 2021-11-26 LAB — LIPID PANEL
Chol/HDL Ratio: 2.9 ratio (ref 0.0–5.0)
Cholesterol, Total: 137 mg/dL (ref 100–199)
HDL: 47 mg/dL (ref >39)
LDL Chol Calc (NIH): 78 mg/dL (ref 0–99)
Triglycerides: 58 mg/dL (ref 0–149)
VLDL Cholesterol Cal: 12 mg/dL (ref 5–40)

## 2021-11-26 NOTE — Progress Notes (Signed)
-   Kidney function and electrolytes normal.  - Continue Atorvastatin for cholesterol maintenance.

## 2021-12-18 ENCOUNTER — Other Ambulatory Visit: Payer: Self-pay

## 2021-12-18 ENCOUNTER — Emergency Department (HOSPITAL_BASED_OUTPATIENT_CLINIC_OR_DEPARTMENT_OTHER)
Admission: EM | Admit: 2021-12-18 | Discharge: 2021-12-18 | Disposition: A | Discharge status: Home / Self Care | Payer: BC Managed Care – PPO | Attending: Emergency Medicine | Admitting: Emergency Medicine

## 2021-12-18 ENCOUNTER — Other Ambulatory Visit: Payer: Self-pay | Admitting: Family

## 2021-12-18 ENCOUNTER — Encounter (HOSPITAL_BASED_OUTPATIENT_CLINIC_OR_DEPARTMENT_OTHER): Payer: Self-pay | Admitting: Emergency Medicine

## 2021-12-18 ENCOUNTER — Emergency Department (HOSPITAL_BASED_OUTPATIENT_CLINIC_OR_DEPARTMENT_OTHER): Payer: BC Managed Care – PPO

## 2021-12-18 DIAGNOSIS — Z87891 Personal history of nicotine dependence: Secondary | ICD-10-CM | POA: Diagnosis not present

## 2021-12-18 DIAGNOSIS — M25551 Pain in right hip: Secondary | ICD-10-CM | POA: Insufficient documentation

## 2021-12-18 DIAGNOSIS — E119 Type 2 diabetes mellitus without complications: Secondary | ICD-10-CM

## 2021-12-18 DIAGNOSIS — Z7984 Long term (current) use of oral hypoglycemic drugs: Secondary | ICD-10-CM | POA: Insufficient documentation

## 2021-12-18 DIAGNOSIS — Z7901 Long term (current) use of anticoagulants: Secondary | ICD-10-CM | POA: Diagnosis not present

## 2021-12-18 DIAGNOSIS — M545 Low back pain, unspecified: Secondary | ICD-10-CM | POA: Diagnosis not present

## 2021-12-18 LAB — URINALYSIS, ROUTINE W REFLEX MICROSCOPIC
Bilirubin Urine: NEGATIVE
Glucose, UA: >=500 mg/dL — AB
Hgb urine dipstick: NEGATIVE
Ketones, ur: NEGATIVE mg/dL
Leukocytes,Ua: NEGATIVE
Nitrite: NEGATIVE
Protein, ur: NEGATIVE mg/dL
Specific Gravity, Urine: 1.025 (ref 1.005–1.030)
pH: 5.5 (ref 5.0–8.0)

## 2021-12-18 LAB — URINALYSIS, MICROSCOPIC (REFLEX)
RBC / HPF: NONE SEEN RBC/hpf (ref 0–5)
Squamous Epithelial / HPF: NONE SEEN (ref 0–5)
WBC, UA: NONE SEEN WBC/hpf (ref 0–5)

## 2021-12-18 MED ORDER — LIDOCAINE 5 % EX PTCH: 1.0000 | MEDICATED_PATCH | CUTANEOUS | 0 refills | Status: DC

## 2021-12-18 MED ORDER — LIDOCAINE 5 % EX PTCH: 1.0000 | MEDICATED_PATCH | CUTANEOUS | Status: DC

## 2021-12-18 MED ORDER — KETOROLAC TROMETHAMINE 30 MG/ML IJ SOLN
30.0000 mg | Freq: Once | INTRAMUSCULAR | Status: AC
  Administered 2021-12-18: 30 mg via INTRAMUSCULAR
  Filled 2021-12-18: qty 1

## 2021-12-18 MED ORDER — NAPROXEN 500 MG PO TABS: 500.0000 mg | ORAL_TABLET | Freq: Two times a day (BID) | ORAL | 0 refills | Status: DC

## 2021-12-18 MED ORDER — CYCLOBENZAPRINE HCL 10 MG PO TABS: 10.0000 mg | ORAL_TABLET | Freq: Two times a day (BID) | ORAL | 0 refills | Status: DC | PRN

## 2021-12-18 NOTE — ED Provider Notes (Signed)
[facility] EMERGENCY DEPARTMENT Provider Note   CSN: [id number] Arrival date & time: 12/18/21  1547     History  Chief Complaint  Patient presents with   Back Pain    [name] [name] is a 56 y.o. male who presents to the emergency department for evaluation of right lower back pain radiating down into the right hip and upper anterior thigh.  Pain is described as an aching that is worse at night.  Patient states that he is unable to sleep because he is woken up from the pain.  Denies trauma or injury.  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  He has previous history of arthritis.  He has been taking on Tylenol 500 mg x 2 and has improvement in pain although it does not last.  Denies numbness and tingling.  Denies shooting pain down the leg.  Has no systemic complaints.   Back Pain      Home Medications Prior to Admission medications   Medication Sig Start Date End Date Taking? Authorizing Provider  cyclobenzaprine (FLEXERIL) 10 MG tablet Take 1 tablet (10 mg total) by mouth 2 (two) times daily as needed for muscle spasms. 12/18/21  Yes Kathe Becton R, PA-C  lidocaine (LIDODERM) 5 % Place 1 patch onto the skin daily. Remove & Discard patch within 12 hours or as directed by MD 12/18/21  Yes Tonye Pearson, PA-C  naproxen (NAPROSYN) 500 MG tablet Take 1 tablet (500 mg total) by mouth 2 (two) times daily. 12/18/21  Yes Tonye Pearson, PA-C  acetaminophen (TYLENOL) 500 MG tablet Take 1,000 mg by mouth every 6 (six) hours as needed for mild pain, moderate pain or headache.    [provider]  apixaban (ELIQUIS) 5 MG TABS tablet Take 1 tablet (5 mg total) by mouth 2 (two) times daily. 06/16/21   Fenton, Clint R, PA  atorvastatin (LIPITOR) 40 MG tablet TAKE 1 TABLET BY MOUTH DAILY 11/18/21   Camillia Herter, NP  dapagliflozin propanediol (FARXIGA) 10 MG TABS tablet Take 1 tablet (10 mg total) by mouth daily before breakfast. 11/25/21 03/25/22  Camillia Herter, NP  diltiazem (CARDIZEM CD) 120 MG 24 hr capsule Take 1 capsule (120 mg total) by mouth 2 (two) times daily. 06/16/21   Fenton, Clint R, PA  flecainide (TAMBOCOR) 100 MG tablet Take 1 tablet (100 mg total) by mouth 2 (two) times daily. 09/09/21   Fenton, Clint R, PA  HYDROcodone-acetaminophen (NORCO/VICODIN) 5-325 MG tablet Take 1 tablet by mouth every 4 (four) hours as needed. 08/05/21   [provider]  JANUVIA 25 MG tablet TAKE 1 TABLET BY MOUTH DAILY 11/18/21   Camillia Herter, NP      Allergies    Metformin and related    Review of Systems   Review of Systems  Musculoskeletal:  Positive for back pain.    Physical Exam Updated Vital Signs BP 129/77   Pulse 73   Temp 98.3 F (36.8 C) (Oral)   Resp 18   Ht 5\' 10"  (1.778 m)   Wt 108.9 kg   SpO2 96%   BMI 34.44 kg/m  Physical Exam Vitals and nursing note reviewed.  Constitutional:      General: He is not in acute distress.    Appearance: He is not ill-appearing.  HENT:     Head: Atraumatic.  Eyes:     Conjunctiva/sclera: Conjunctivae normal.  Cardiovascular:     Rate and Rhythm: Normal  rate and regular rhythm.     Pulses: Normal pulses.     Heart sounds: No murmur heard. Pulmonary:     Effort: Pulmonary effort is normal. No respiratory distress.     Breath sounds: Normal breath sounds.  Abdominal:     General: Abdomen is flat. There is no distension.     Palpations: Abdomen is soft.     Tenderness: There is no abdominal tenderness.  Musculoskeletal:        General: Normal range of motion.     Cervical back: Normal range of motion.     Comments: T-spine and L-spine nontender to palpation at midline. Patient moves all extremities without difficulty. All joints supple and easily movable, no erythema, swelling or palpable deformity, all compartments soft.  Patient is nontender to palpation of the right lumbar musculature into the right hip and right leg.  He has full range of motion.  Ambulatory without  difficulty.  Negative straight leg raise.   Skin:    General: Skin is warm and dry.     Capillary Refill: Capillary refill takes less than 2 seconds.  Neurological:     General: No focal deficit present.     Mental Status: He is alert.  Psychiatric:        Mood and Affect: Mood normal.     ED Results / Procedures / Treatments   Labs (all labs ordered are listed, but only abnormal results are displayed) Labs Reviewed  URINALYSIS, ROUTINE W REFLEX MICROSCOPIC - Abnormal; Notable for the following components:      Result Value   Glucose, UA >=500 (*)    All other components within normal limits  URINALYSIS, MICROSCOPIC (REFLEX) - Abnormal; Notable for the following components:   Bacteria, UA RARE (*)    All other components within normal limits    EKG None  Radiology DG Hip Unilat With Pelvis 2-3 Views Right  Result Date: 12/18/2021 CLINICAL DATA:  Right hip pain. EXAM: DG HIP (WITH OR WITHOUT PELVIS) 2-3V RIGHT COMPARISON:  None Available. FINDINGS: There is no evidence of hip fracture or dislocation. There is no evidence of arthropathy or other focal bone abnormality. IMPRESSION: Negative. Electronically Signed   By: Misty Stanley M.D.   On: 12/18/2021 17:06    Procedures Procedures    Medications Ordered in ED Medications  ketorolac (TORADOL) 30 MG/ML injection 30 mg (has no administration in time range)  lidocaine (LIDODERM) 5 % 1 patch (1 patch Transdermal Not Given 12/18/21 1723)    ED Course/ Medical Decision Making/ A&P                           Medical Decision Making Amount and/or Complexity of Data Reviewed Labs: ordered. Radiology: ordered.  Risk Prescription drug management.   Social determinants of health:  Social History   Socioeconomic History   Marital status: Married    Spouse name: Not on file   Number of children: Not on file   Years of education: Not on file   Highest education level: Not on file  Occupational History   Not on file   Tobacco Use   Smoking status: Former    Types: Cigarettes    Passive exposure: Past   Smokeless tobacco: Never   Tobacco comments:    Former Smoker Quit April 2013 08/12/21  Vaping Use   Vaping Use: Never used  Substance and Sexual Activity   Alcohol use: Not Currently  Drug use: Not Currently   Sexual activity: Not on file  Other Topics Concern   Not on file  Social History Narrative   Not on file   Social Determinants of Health   Financial Resource Strain: Not on file  Food Insecurity: Not on file  Transportation Needs: Not on file  Physical Activity: Not on file  Stress: Not on file  Social Connections: Not on file  Intimate Partner Violence: Not on file     Initial impression:  This patient presents to the ED for concern of right lower back pain, this involves an extensive number of treatment options, and is a complaint that carries with it a high risk of complications and morbidity.   Differentials include fracture, muscular strain, arthritis, sciatica.   Comorbidities affecting care:  Arthritis  Additional history obtained: None  Lab Tests  I Ordered, reviewed, and interpreted labs and EKG.  The pertinent results include:  UA normal  Imaging Studies ordered:  I ordered imaging studies including  Right hip x-ray without acute findings or arthritic changes I independently visualized and interpreted imaging and I agree with the radiologist interpretation.   Medicines ordered and prescription drug management:  I ordered medication including: Toradol 30 mg IM Reevaluation of the patient after these medicines showed that the patient improved I have reviewed the patients home medicines and have made adjustments as needed  ED Course/Re-evaluation: 56 year old male presents to the ED for evaluation of right lower back pain extending into the hip.  Vitals without significant abnormality.  On exam, patient has no tenderness to palpation of the right lumbar  musculature and no reproducible tenderness of the hip and the thigh.  He states the pain is aching and worse at night and points to his posterior right hip.  I obtained x-ray which was without acute findings or arthritic changes.  Symptoms not consistent with cauda equina or fracture.  Symptoms also not consistent with bursitis.  Will provide lidocaine patch, muscle relaxers and naproxen.  Advised to follow-up with PCP as needed.  Patient expresses understanding is amenable to plan.  Disposition:  After consideration of the diagnostic results, physical exam, history and the patients response to treatment feel that the patent would benefit from discharge.   Right hip pain: Plan and management as described above. Discharged home in good condition.  Final Clinical Impression(s) / ED Diagnoses Final diagnoses:  Right hip pain    Rx / DC Orders ED Discharge Orders          Ordered    lidocaine (LIDODERM) 5 %  Every 24 hours        12/18/21 1720    naproxen (NAPROSYN) 500 MG tablet  2 times daily        12/18/21 1720    cyclobenzaprine (FLEXERIL) 10 MG tablet  2 times daily PRN        12/18/21 1720              Tonye Pearson, PA-C 12/18/21 1724    Malvin Johns, MD 12/18/21 2042

## 2021-12-18 NOTE — ED Notes (Signed)
D/c paperwork reviewed with pt, including prescriptions and f/u care. Pt verbalized understanding, no questions or concerns at time of d/c. Ambulatory to ED exit with steady gait.

## 2021-12-18 NOTE — Discharge Instructions (Signed)
Your x-ray today was normal.  It seems you are having possibly some musculoskeletal pain.  You can try lidocaine patches at home.  I have given you a prescription for it, however if your insurance does not cover it, you can pick up over-the-counter medication called Salonpas which is very similar.  I have also sent in muscle relaxers and an anti-inflammatory medication which may help with some of your symptoms.  You can take this in addition to Tylenol although I would recommend alternating the Tylenol and the naproxen every 4 hours.  Follow-up with your PCP if this becomes a chronic issue.

## 2021-12-18 NOTE — ED Triage Notes (Signed)
Pt arrives pov, steady gait c/o right side lower back pain radiating into leg. Denies injury, denies dysuria, or otc meds today

## 2021-12-19 NOTE — Telephone Encounter (Signed)
Farxiga refilled 11/25/2021 for 4 month supply. Call patient to confirm necessity of early refill request.

## 2021-12-21 ENCOUNTER — Other Ambulatory Visit: Payer: Self-pay | Admitting: Family

## 2021-12-21 DIAGNOSIS — E119 Type 2 diabetes mellitus without complications: Secondary | ICD-10-CM

## 2021-12-21 NOTE — Telephone Encounter (Signed)
Farxiga refilled on 11/25/2021 for 4 month supply. Call patient to determine necessity of early refill request.

## 2022-01-25 ENCOUNTER — Other Ambulatory Visit (HOSPITAL_COMMUNITY): Payer: Self-pay | Admitting: Physician Assistant

## 2022-02-17 ENCOUNTER — Other Ambulatory Visit (HOSPITAL_COMMUNITY): Payer: Self-pay

## 2022-02-17 ENCOUNTER — Ambulatory Visit (HOSPITAL_COMMUNITY)
Admission: RE | Admit: 2022-02-17 | Discharge: 2022-02-17 | Disposition: A | Discharge status: Home / Self Care | Payer: BC Managed Care – PPO | Source: Ambulatory Visit | Attending: Physician Assistant | Admitting: Physician Assistant

## 2022-02-17 VITALS — BP 146/60 | HR 78 | Ht 70.0 in | Wt 237.6 lb

## 2022-02-17 DIAGNOSIS — I5032 Chronic diastolic (congestive) heart failure: Secondary | ICD-10-CM | POA: Insufficient documentation

## 2022-02-17 DIAGNOSIS — I483 Typical atrial flutter: Secondary | ICD-10-CM | POA: Insufficient documentation

## 2022-02-17 DIAGNOSIS — G4733 Obstructive sleep apnea (adult) (pediatric): Secondary | ICD-10-CM | POA: Insufficient documentation

## 2022-02-17 DIAGNOSIS — E669 Obesity, unspecified: Secondary | ICD-10-CM | POA: Insufficient documentation

## 2022-02-17 DIAGNOSIS — E119 Type 2 diabetes mellitus without complications: Secondary | ICD-10-CM | POA: Diagnosis not present

## 2022-02-17 DIAGNOSIS — I480 Paroxysmal atrial fibrillation: Secondary | ICD-10-CM | POA: Diagnosis not present

## 2022-02-17 DIAGNOSIS — Z6834 Body mass index (BMI) 34.0-34.9, adult: Secondary | ICD-10-CM | POA: Insufficient documentation

## 2022-02-17 DIAGNOSIS — Z7182 Exercise counseling: Secondary | ICD-10-CM | POA: Diagnosis not present

## 2022-02-17 DIAGNOSIS — Z7901 Long term (current) use of anticoagulants: Secondary | ICD-10-CM | POA: Insufficient documentation

## 2022-02-17 DIAGNOSIS — Z79899 Other long term (current) drug therapy: Secondary | ICD-10-CM | POA: Insufficient documentation

## 2022-02-17 MED ORDER — APIXABAN 5 MG PO TABS: 5.0000 mg | ORAL_TABLET | Freq: Two times a day (BID) | ORAL | 3 refills | Status: DC

## 2022-02-17 MED ORDER — DILTIAZEM HCL ER COATED BEADS 120 MG PO CP24: 120.0000 mg | ORAL_CAPSULE | Freq: Two times a day (BID) | ORAL | 3 refills | Status: DC

## 2022-02-17 MED ORDER — FLECAINIDE ACETATE 100 MG PO TABS: 100.0000 mg | ORAL_TABLET | Freq: Two times a day (BID) | ORAL | 3 refills | Status: DC

## 2022-02-17 NOTE — Progress Notes (Addendum)
Primary Care Physician: Rema Fendt, NP Primary Cardiologist: Dr Delton See (remotely) Primary Electrophysiologist: none Referring Physician: [facility]   [name] [name] is a 56 y.o. male with a history of paroxysmal afib, atrial flutter, diastolic CHF, OSA, DM, and tobacco use. He was initially diagnosed with afib in 2013 in the setting of CAP. He spontaneously converted back to SR. Unfortunately, patient tested positive for COVID-19 and was found to be in atrial flutter and later in afib at the ER. He converted to SR after diltiazem drip. He was asymptomatic during the episode. He denies any alcohol use.   Patient has had multiple ED visits for various reasons. He presented to the ED on 10/07/20 for cough and sore throat and was incidentally found to be in rapid atrial flutter. He underwent DCCV at that time. He does admit to taking psuedophedrine before the onset of his symptoms. He was back in rapid flutter on follow up and started on flecainide. Patient is s/p DCCV 10/29/20.   On follow up today, patient reports that he has done well from an afib standpoint. He has not had any tachypalpitations. He will occasionally have a small amount of blood after wiping but this has been chronic for years, has a long history of hemorrhoids.   Today, he denies symptoms of palpitations, chest pain, shortness of breath, orthopnea, PND, lower extremity edema, dizziness, or neurologic sequela. The patient is tolerating medications without difficulties and is otherwise without complaint today.    Atrial Fibrillation Risk Factors:  he does have symptoms or diagnosis of sleep apnea. He is not on CPAP therapy. he does have a history of alcohol use. Does not drink currently.   he has a BMI of Body mass index is 34.09 kg/m.Marland Kitchen Filed Weights   02/17/22 1028  Weight: 107.8 kg    Family History  Problem Relation Age of Onset   Heart disease Neg Hx      Atrial Fibrillation Management  history:  Previous antiarrhythmic drugs: flecainide  Previous cardioversions: 10/29/20 Previous ablations: none CHADS2VASC score: 1 Anticoagulation history: Eliquis   Past Medical History:  Diagnosis Date   ARF (acute renal failure) (HCC)    Atrial flutter (HCC)    Community acquired bacterial pneumonia    Diastolic CHF, chronic (HCC)    Leukocytosis    N&V (nausea and vomiting)    SOB (shortness of breath)    Substance abuse (HCC)    Prior hx of cocaine (quit 5 years ago)/EtOH (clean for 14 months) as of 10/2011   Past Surgical History:  Procedure Laterality Date   CARDIOVERSION N/A 10/29/2020   Procedure: CARDIOVERSION;  Surgeon: Vesta Mixer, MD;  Location: [facility];  Service: Cardiovascular;  Laterality: N/A;    Current Outpatient Medications  Medication Sig Dispense Refill   acetaminophen (TYLENOL) 500 MG tablet Take 1,000 mg by mouth as needed for mild pain, moderate pain or headache.     atorvastatin (LIPITOR) 40 MG tablet TAKE 1 TABLET BY MOUTH DAILY 90 tablet 3   cyclobenzaprine (FLEXERIL) 10 MG tablet Take 1 tablet (10 mg total) by mouth 2 (two) times daily as needed for muscle spasms. 20 tablet 0   dapagliflozin propanediol (FARXIGA) 10 MG TABS tablet Take 1 tablet (10 mg total) by mouth daily before breakfast. 120 tablet 0   diltiazem (CARDIZEM CD) 120 MG 24 hr capsule TAKE 1 CAPSULE BY MOUTH TWICE  DAILY 180 capsule 3   ELIQUIS 5 MG TABS tablet TAKE 1 TABLET  BY MOUTH TWICE  DAILY 180 tablet 3   flecainide (TAMBOCOR) 100 MG tablet Take 1 tablet (100 mg total) by mouth 2 (two) times daily. 180 tablet 2   HYDROcodone-acetaminophen (NORCO/VICODIN) 5-325 MG tablet Take 1 tablet by mouth every 4 (four) hours as needed.     JANUVIA 25 MG tablet TAKE 1 TABLET BY MOUTH DAILY 90 tablet 3   lidocaine (LIDODERM) 5 % Place 1 patch onto the skin daily. Remove & Discard patch within 12 hours or as directed by MD 30 patch 0   naproxen (NAPROSYN) 500 MG tablet Take 1 tablet  (500 mg total) by mouth 2 (two) times daily. (Patient taking differently: Take 500 mg by mouth as needed.) 30 tablet 0   No current facility-administered medications for this encounter.    Allergies  Allergen Reactions   Metformin And Related Other (See Comments)    Pt endorses blurred vision with metformin use.     Social History   Socioeconomic History   Marital status: Married    Spouse name: Not on file   Number of children: Not on file   Years of education: Not on file   Highest education level: Not on file  Occupational History   Not on file  Tobacco Use   Smoking status: Former    Types: Cigarettes    Passive exposure: Past   Smokeless tobacco: Never   Tobacco comments:    Former Smoker Quit April 2013 08/12/21  Vaping Use   Vaping Use: Never used  Substance and Sexual Activity   Alcohol use: Not Currently   Drug use: Not Currently   Sexual activity: Not on file  Other Topics Concern   Not on file  Social History Narrative   Not on file   Social Determinants of Health   Financial Resource Strain: Not on file  Food Insecurity: Not on file  Transportation Needs: Not on file  Physical Activity: Not on file  Stress: Not on file  Social Connections: Not on file  Intimate Partner Violence: Not on file     ROS- All systems are reviewed and negative except as per the HPI above.  Physical Exam: Vitals:   02/17/22 1028  Weight: 107.8 kg  Height: 5\' 10"  (1.778 m)    GEN- The patient is a well appearing obese male, alert and oriented x 3 today.   HEENT-head normocephalic, atraumatic, sclera clear, conjunctiva pink, hearing intact, trachea midline. Lungs- Clear to ausculation bilaterally, normal work of breathing Heart- Regular rate and rhythm, no murmurs, rubs or gallops  GI- soft, NT, ND, + BS Extremities- no clubbing, cyanosis, or edema MS- no significant deformity or atrophy Skin- no rash or lesion Psych- euthymic mood, full affect Neuro- strength  and sensation are intact   Wt Readings from Last 3 Encounters:  02/17/22 107.8 kg  12/18/21 108.9 kg  11/25/21 106.6 kg    EKG today demonstrates  SR Vent. rate 78 BPM PR interval 166 ms QRS duration 96 ms QT/QTcB 322/367 ms  Echo 11/19/20 demonstrated   1. Left ventricular ejection fraction, by estimation, is 55 to 60%. The  left ventricle has normal function. The left ventricle demonstrates global hypokinesis. Left ventricular diastolic parameters were normal.   2. Right ventricular systolic function is normal. The right ventricular  size is normal. Tricuspid regurgitation signal is inadequate for assessing PA pressure.   3. The mitral valve is grossly normal. Trivial mitral valve  regurgitation.   4. The aortic valve is tricuspid.  Aortic valve regurgitation is not  visualized.   5. The inferior vena cava is dilated in size with >50% respiratory  variability, suggesting right atrial pressure of 8 mmHg.   Comparison(s): Prior images unable to be directly viewed, comparison made by report only. No significant change from prior study. 10/23/2011: LVEF 55-60%.    Epic records are reviewed at length today  CHA2DS2-VASc Score = 1  The patient's score is based upon: CHF History: 0 HTN History: 0 Diabetes History: 1 Stroke History: 0 Vascular Disease History: 0 Age Score: 0 Gender Score: 0       ASSESSMENT AND PLAN: 1. Paroxysmal Atrial Fibrillation/typical atrial flutter The patient's CHA2DS2-VASc score is 1, indicating a 0.6% annual risk of stroke.   Patient in SR today. Continue flecainide 100 mg BID Continue diltiazem 120 mg BID Continue Eliquis 5 mg BID OK from cardiac standpoint for sildenafil or tadalafil (patient inquiring, PCP to fill).  2. Obesity Body mass index is 34.09 kg/m. Lifestyle modification was discussed and encouraged including regular physical activity and weight reduction.  3. Severe OSA He is not compliant with CPAP therapy, never got device  after sleep study. Will refer to sleep medicine, he may need repeat sleep study since his was completed in 2021.   Follow up in the AF clinic in 6 months. Will refer to Dr Mayford Knife to establish care for both cardiology and OSA.    Jorja Loa PA-C [facility] [address] [place], [place] [address] [phone] 02/17/2022 10:45 AM

## 2022-02-17 NOTE — Telephone Encounter (Signed)
Patient called in regarding his refills on his medications. He needed refills on his Eliquis, Diltiazem and Flecainide medication.  His medications were sent to be filled at [facility] on 7/19. Contacted [facility] and the pharmacist stated it was to soon to fill his medications. The pharmacist states he resubmitted his Eliquis and Diltiazem to be filled today and will resubmit his Flecainide medication tomorrow. Patient was told he should receive his medications by next week. He will contact the clinic back if he needs a 30 day supply to his local pharmacy to get him by until he receives his medications from [facility]. Consulted with patient and he verbalized understanding.

## 2022-03-03 ENCOUNTER — Telehealth: Payer: Self-pay | Admitting: Family

## 2022-03-03 NOTE — Telephone Encounter (Signed)
Med refills needed before upcoming appt please.  JANUVIA 25 MG tablet [[id number]]   atorvastatin (LIPITOR) 40 MG tablet [[id number]]     Pharmacy  [facility] ([facility] - [place], [place] - [address]  [address] [place] [address]  Phone:  [phone]  Fax:  [phone]

## 2022-03-03 NOTE — Telephone Encounter (Signed)
Pt should contact [facility] to confirm that refill is needed, put was last refilled 11/20/21 and should last thru 09/16 which is after appt w/provider on 09/08

## 2022-03-06 NOTE — Progress Notes (Signed)
Patient ID: [name] [name], male    DOB: June 25, 1966  MRN: [id number]  CC: Chronic Care Management   Subjective: [name] [name] is a 56 y.o. male who presents for chronic care management.   His concerns today include:  Doing well on diabetes and cholesterol medications without issues or concerns. Reports he has two bottles of Farxiga at home from recent pharmacy pickup. He did speak with his cardiologist during 02/17/2022 appointment and ok for him begin Sildenafil per encounter note.   Patient Active Problem List   Diagnosis Date Noted   Hyperlipidemia 11/30/2020   Persistent atrial fibrillation (HCC)    Paroxysmal atrial fibrillation (HCC) 05/29/2019   Substance abuse (HCC)    Diastolic CHF, chronic    Diastolic CHF, chronic (HCC) 10/24/2011   Atrial flutter (HCC) 10/22/2011   Community acquired bacterial pneumonia 10/21/2011   SOB (shortness of breath) 10/21/2011   N&V (nausea and vomiting) 10/21/2011   ARF (acute renal failure) (HCC) 10/21/2011   Tobacco abuse 10/21/2011   Leukocytosis 10/21/2011   Fever 10/21/2011     Current Outpatient Medications on File Prior to Visit  Medication Sig Dispense Refill   acetaminophen (TYLENOL) 500 MG tablet Take 1,000 mg by mouth as needed for mild pain, moderate pain or headache.     apixaban (ELIQUIS) 5 MG TABS tablet Take 1 tablet (5 mg total) by mouth 2 (two) times daily. 180 tablet 3   atorvastatin (LIPITOR) 40 MG tablet TAKE 1 TABLET BY MOUTH DAILY 90 tablet 3   cyclobenzaprine (FLEXERIL) 10 MG tablet Take 1 tablet (10 mg total) by mouth 2 (two) times daily as needed for muscle spasms. 20 tablet 0   dapagliflozin propanediol (FARXIGA) 10 MG TABS tablet Take 1 tablet (10 mg total) by mouth daily before breakfast. 120 tablet 0   diltiazem (CARDIZEM CD) 120 MG 24 hr capsule Take 1 capsule (120 mg total) by mouth 2 (two) times daily. 180 capsule 3   flecainide (TAMBOCOR) 100 MG tablet Take 1 tablet (100 mg total) by mouth 2 (two) times  daily. 180 tablet 3   HYDROcodone-acetaminophen (NORCO/VICODIN) 5-325 MG tablet Take 1 tablet by mouth every 4 (four) hours as needed.     JANUVIA 25 MG tablet TAKE 1 TABLET BY MOUTH DAILY 90 tablet 3   lidocaine (LIDODERM) 5 % Place 1 patch onto the skin daily. Remove & Discard patch within 12 hours or as directed by MD 30 patch 0   naproxen (NAPROSYN) 500 MG tablet Take 1 tablet (500 mg total) by mouth 2 (two) times daily. (Patient taking differently: Take 500 mg by mouth as needed.) 30 tablet 0   No current facility-administered medications on file prior to visit.    Allergies  Allergen Reactions   Metformin And Related Other (See Comments)    Pt endorses blurred vision with metformin use.     Social History   Socioeconomic History   Marital status: Married    Spouse name: Not on file   Number of children: Not on file   Years of education: Not on file   Highest education level: Not on file  Occupational History   Not on file  Tobacco Use   Smoking status: Former    Types: Cigarettes    Passive exposure: Past   Smokeless tobacco: Never   Tobacco comments:    Former Smoker Quit April 2013 08/12/21  Vaping Use   Vaping Use: Never used  Substance and Sexual Activity   Alcohol use: Not  Currently   Drug use: Not Currently   Sexual activity: Not on file  Other Topics Concern   Not on file  Social History Narrative   Not on file   Social Determinants of Health   Financial Resource Strain: Not on file  Food Insecurity: Not on file  Transportation Needs: Not on file  Physical Activity: Not on file  Stress: Not on file  Social Connections: Not on file  Intimate Partner Violence: Not on file    Family History  Problem Relation Age of Onset   Heart disease Neg Hx     Past Surgical History:  Procedure Laterality Date   CARDIOVERSION N/A 10/29/2020   Procedure: CARDIOVERSION;  Surgeon: Nahser, Deloris Ping, MD;  Location: [facility];  Service: Cardiovascular;   Laterality: N/A;    ROS: Review of Systems Negative except as stated above  PHYSICAL EXAM: BP 128/81 (BP Location: Left Arm, Patient Position: Sitting, Cuff Size: Large)   Pulse 75   Temp 98.3 F (36.8 C)   Resp 18   Ht 5\' 10"  (1.778 m)   Wt 238 lb (108 kg)   SpO2 95%   BMI 34.15 kg/m   Physical Exam HENT:     Head: Normocephalic and atraumatic.  Eyes:     Extraocular Movements: Extraocular movements intact.     Conjunctiva/sclera: Conjunctivae normal.     Pupils: Pupils are equal, round, and reactive to light.  Cardiovascular:     Rate and Rhythm: Normal rate and regular rhythm.     Pulses: Normal pulses.     Heart sounds: Normal heart sounds.  Pulmonary:     Effort: Pulmonary effort is normal.     Breath sounds: Normal breath sounds.  Musculoskeletal:     Cervical back: Normal range of motion and neck supple.  Neurological:     General: No focal deficit present.     Mental Status: He is alert and oriented to person, place, and time.  Psychiatric:        Mood and Affect: Mood normal.        Behavior: Behavior normal.     ASSESSMENT AND PLAN: 1. Type 2 diabetes mellitus without complication, without long-term current use of insulin (HCC) - Continue Sitagliptin as prescribed. No refills needed as of present.  - Continue Dapagliflozin Propanediol as prescribed. No refills needed as of present. - Discussed the importance of healthy eating habits, low-carbohydrate diet, low-sugar diet, regular aerobic exercise (at least 150 minutes a week as tolerated) and medication compliance to achieve or maintain control of diabetes. - Sending hemoglobin A1c to lab, pending result.  - Follow-up with primary provider as scheduled.  - Hemoglobin A1c  2. Erectile dysfunction, unspecified erectile dysfunction type - Sildenafil as prescribed. Counseled on medication adherence and adverse effects.  - Follow-up with primary provider as scheduled.  - sildenafil (VIAGRA) 50 MG tablet;  Take 50 mg (1 tablet total) 1/2 hour to 1 hour prior to intercourse as needed. Limit use to 1/2 tablet or 1 tablet per 24 hours.  Dispense: 30 tablet; Refill: 2  3. Need for immunization against influenza - Administered.  - Flu Vaccine QUAD 27mo+IM (Fluarix, Fluzone & Alfiuria Quad PF)    Patient was given the opportunity to ask questions.  Patient verbalized understanding of the plan and was able to repeat key elements of the plan. Patient was given clear instructions to go to Emergency Department or return to medical center if symptoms don't improve, worsen, or new problems develop.The  patient verbalized understanding.   Orders Placed This Encounter  Procedures   Flu Vaccine QUAD 16mo+IM (Fluarix, Fluzone & Alfiuria Quad PF)   Hemoglobin A1c     Requested Prescriptions   Signed Prescriptions Disp Refills   sildenafil (VIAGRA) 50 MG tablet 30 tablet 2    Sig: Take 50 mg (1 tablet total) 1/2 hour to 1 hour prior to intercourse as needed. Limit use to 1/2 tablet or 1 tablet per 24 hours.    Return in about 3 months (around 06/16/2022) for Follow-Up or next available chronic care mgmt.  Rema Fendt, NP

## 2022-03-17 ENCOUNTER — Encounter: Payer: Self-pay | Admitting: Family

## 2022-03-17 ENCOUNTER — Ambulatory Visit (INDEPENDENT_AMBULATORY_CARE_PROVIDER_SITE_OTHER): Payer: BC Managed Care – PPO | Admitting: Family

## 2022-03-17 VITALS — BP 128/81 | HR 75 | Temp 98.3°F | Resp 18 | Ht 70.0 in | Wt 238.0 lb

## 2022-03-17 DIAGNOSIS — E119 Type 2 diabetes mellitus without complications: Secondary | ICD-10-CM | POA: Diagnosis not present

## 2022-03-17 DIAGNOSIS — Z23 Encounter for immunization: Secondary | ICD-10-CM

## 2022-03-17 DIAGNOSIS — N529 Male erectile dysfunction, unspecified: Secondary | ICD-10-CM

## 2022-03-17 MED ORDER — SILDENAFIL CITRATE 50 MG PO TABS: ORAL_TABLET | ORAL | 2 refills | Status: DC

## 2022-03-17 NOTE — Progress Notes (Signed)
.  Pt presents for chronic care management   

## 2022-03-17 NOTE — Patient Instructions (Signed)
Sitagliptin Tablets What is this medication? SITAGLIPTIN (sit a GLIP tin) treats type 2 diabetes. It works by increasing insulin levels in your body, which decreases your blood sugar (glucose). It also reduces the amount of sugar released into your blood. Changes to diet and exercise are often combined with this medication. This medicine may be used for other purposes; ask your health care provider or pharmacist if you have questions. COMMON BRAND NAME(S): Januvia What should I tell my care team before I take this medication? They need to know if you have any of these conditions: Diabetic ketoacidosis Kidney disease Pancreatitis Previous swelling of the tongue, face, or lips with difficulty breathing, difficulty swallowing, hoarseness, or tightening of the throat Type 1 diabetes An unusual or allergic reaction to sitagliptin, other medications, foods, dyes, or preservatives Pregnant or trying to get pregnant Breast-feeding How should I use this medication? Take this medication by mouth with a glass of water. Follow the directions on the prescription label. You can take it with or without food. Do not cut, crush or chew this medication. Take your dose at the same time each day. Do not take more often than directed. Do not stop taking except on your care team's advice. A special MedGuide will be given to you by the pharmacist with each prescription and refill. Be sure to read this information carefully each time. Talk to your care team about the use of this medication in children. It is not approved for use in children. Overdosage: If you think you have taken too much of this medicine contact a poison control center or emergency room at once. NOTE: This medicine is only for you. Do not share this medicine with others. What if I miss a dose? If you miss a dose, take it as soon as you can. If it is almost time for your next dose, take only that dose. Do not take double or extra doses. What may  interact with this medication? Do not take this medication with any of the following: Gatifloxacin This medication may also interact with the following: Alcohol Digoxin Insulin Sulfonylureas like glimepiride, glipizide, glyburide This list may not describe all possible interactions. Give your health care provider a list of all the medicines, herbs, non-prescription drugs, or dietary supplements you use. Also tell them if you smoke, drink alcohol, or use illegal drugs. Some items may interact with your medicine. What should I watch for while using this medication? Visit your care team for regular checks on your progress. A test called the HbA1C (A1C) will be monitored. This is a simple blood test. It measures your blood sugar control over the last 2 to 3 months. You will receive this test every 3 to 6 months. Learn how to check your blood sugar. Learn the symptoms of low and high blood sugar and how to manage them. Always carry a quick-source of sugar with you in case you have symptoms of low blood sugar. Examples include hard sugar candy or glucose tablets. Make sure others know that you can choke if you eat or drink when you develop serious symptoms of low blood sugar, such as seizures or unconsciousness. They must get medical help at once. Tell your care team if you have high blood sugar. You might need to change the dose of your medication. If you are sick or exercising more than usual, you might need to change the dose of your medication. Do not skip meals. Ask your care team if you should avoid alcohol. Many   nonprescription cough and cold products contain sugar or alcohol. These can affect blood sugar. Wear a medical ID bracelet or chain, and carry a card that describes your disease and details of your medication and dosage times. What side effects may I notice from receiving this medication? Side effects that you should report to your care team as soon as possible: Allergic reactions--skin  rash, itching, hives, swelling of the face, lips, tongue, or throat Heart failure--shortness of breath, swelling of the ankles, feet, or hands, sudden weight gain, unusual weakness or fatigue Kidney injury--decrease in the amount of urine, swelling of the ankles, hands, or feet Pancreatitis--severe stomach pain that spreads to your back or gets worse after eating or when touched, fever, nausea, vomiting Redness, blistering, peeling or loosening of the skin, including inside the mouth Severe joint pain Side effects that usually do not require medical attention (report to your care team if they continue or are bothersome): Headache Runny or stuffy nose Sore throat This list may not describe all possible side effects. Call your doctor for medical advice about side effects. You may report side effects to FDA at 1-800-FDA-1088. Where should I keep my medication? Keep out of the reach of children. Store at room temperature between 15 and 30 degrees C (59 and 86 degrees F). Throw away any unused medication after the expiration date. NOTE: This sheet is a summary. It may not cover all possible information. If you have questions about this medicine, talk to your doctor, pharmacist, or health care provider.  2023 Elsevier/Gold Standard (2020-10-11 00:00:00)

## 2022-03-18 LAB — HEMOGLOBIN A1C
Est. average glucose Bld gHb Est-mCnc: 151 mg/dL
Hgb A1c MFr Bld: 6.9 % — ABNORMAL HIGH (ref 4.8–5.6)

## 2022-05-02 ENCOUNTER — Other Ambulatory Visit: Payer: Self-pay

## 2022-05-02 DIAGNOSIS — E119 Type 2 diabetes mellitus without complications: Secondary | ICD-10-CM

## 2022-05-02 MED ORDER — DAPAGLIFLOZIN PROPANEDIOL 10 MG PO TABS: 10.0000 mg | ORAL_TABLET | Freq: Every day | ORAL | 0 refills | Status: DC

## 2022-06-03 ENCOUNTER — Encounter (HOSPITAL_BASED_OUTPATIENT_CLINIC_OR_DEPARTMENT_OTHER): Payer: Self-pay | Admitting: Emergency Medicine

## 2022-06-03 ENCOUNTER — Emergency Department (HOSPITAL_BASED_OUTPATIENT_CLINIC_OR_DEPARTMENT_OTHER)
Admission: EM | Admit: 2022-06-03 | Discharge: 2022-06-03 | Disposition: A | Discharge status: Home / Self Care | Payer: BC Managed Care – PPO | Attending: Emergency Medicine | Admitting: Emergency Medicine

## 2022-06-03 ENCOUNTER — Other Ambulatory Visit: Payer: Self-pay

## 2022-06-03 DIAGNOSIS — E119 Type 2 diabetes mellitus without complications: Secondary | ICD-10-CM | POA: Insufficient documentation

## 2022-06-03 DIAGNOSIS — Z7984 Long term (current) use of oral hypoglycemic drugs: Secondary | ICD-10-CM | POA: Insufficient documentation

## 2022-06-03 DIAGNOSIS — Z7901 Long term (current) use of anticoagulants: Secondary | ICD-10-CM | POA: Diagnosis not present

## 2022-06-03 DIAGNOSIS — J029 Acute pharyngitis, unspecified: Secondary | ICD-10-CM | POA: Diagnosis not present

## 2022-06-03 DIAGNOSIS — Z1152 Encounter for screening for COVID-19: Secondary | ICD-10-CM | POA: Diagnosis not present

## 2022-06-03 DIAGNOSIS — J039 Acute tonsillitis, unspecified: Secondary | ICD-10-CM | POA: Insufficient documentation

## 2022-06-03 LAB — SARS CORONAVIRUS 2 BY RT PCR: SARS Coronavirus 2 by RT PCR: NEGATIVE

## 2022-06-03 LAB — GROUP A STREP BY PCR: Group A Strep by PCR: NOT DETECTED

## 2022-06-03 MED ORDER — AMOXICILLIN 500 MG PO CAPS: 1000.0000 mg | ORAL_CAPSULE | Freq: Every day | ORAL | 0 refills | Status: AC

## 2022-06-03 NOTE — ED Provider Notes (Signed)
[facility] EMERGENCY DEPARTMENT Provider Note   CSN: [id number] Arrival date & time: 06/03/22  [id number]     History  Chief Complaint  Patient presents with   Headache   Sore Throat    [name] [name] is a 56 y.o. male.  Patient with history of A-fib on anticoagulation, diabetes, high cholesterol --presents to the emergency department for evaluation of sore throat and headache ongoing over the past 3 to 4 days.  Patient has been able to tolerate liquids, but reports pain with swallowing.  States he gets an infection like this every year.  He reports subjective fever and intermittent chills.  No vomiting or diarrhea.  No confusion, neck pain, extremity weakness.  Headache is generalized.  No injuries.  Patient states that typically he feels better with course of antibiotics.       Home Medications Prior to Admission medications   Medication Sig Start Date End Date Taking? Authorizing Provider  amoxicillin (AMOXIL) 500 MG capsule Take 2 capsules (1,000 mg total) by mouth daily for 7 days. 06/03/22 06/10/22 Yes Carlisle Cater, PA-C  acetaminophen (TYLENOL) 500 MG tablet Take 1,000 mg by mouth as needed for mild pain, moderate pain or headache.    [provider]  apixaban (ELIQUIS) 5 MG TABS tablet Take 1 tablet (5 mg total) by mouth 2 (two) times daily. 02/17/22   Fenton, Clint R, PA  atorvastatin (LIPITOR) 40 MG tablet TAKE 1 TABLET BY MOUTH DAILY 11/18/21   Camillia Herter, NP  cyclobenzaprine (FLEXERIL) 10 MG tablet Take 1 tablet (10 mg total) by mouth 2 (two) times daily as needed for muscle spasms. 12/18/21   Tonye Pearson, PA-C  dapagliflozin propanediol (FARXIGA) 10 MG TABS tablet Take 1 tablet (10 mg total) by mouth daily before breakfast. 05/02/22 08/30/22  Camillia Herter, NP  diltiazem (CARDIZEM CD) 120 MG 24 hr capsule Take 1 capsule (120 mg total) by mouth 2 (two) times daily. 02/17/22   Fenton, Clint R, PA  flecainide (TAMBOCOR) 100 MG tablet Take 1 tablet  (100 mg total) by mouth 2 (two) times daily. 02/17/22   Fenton, Clint R, PA  HYDROcodone-acetaminophen (NORCO/VICODIN) 5-325 MG tablet Take 1 tablet by mouth every 4 (four) hours as needed. 08/05/21   [provider]  JANUVIA 25 MG tablet TAKE 1 TABLET BY MOUTH DAILY 11/18/21   Camillia Herter, NP  lidocaine (LIDODERM) 5 % Place 1 patch onto the skin daily. Remove & Discard patch within 12 hours or as directed by MD 12/18/21   Tonye Pearson, PA-C  naproxen (NAPROSYN) 500 MG tablet Take 1 tablet (500 mg total) by mouth 2 (two) times daily. Patient taking differently: Take 500 mg by mouth as needed. 12/18/21   Tonye Pearson, PA-C  sildenafil (VIAGRA) 50 MG tablet Take 50 mg (1 tablet total) 1/2 hour to 1 hour prior to intercourse as needed. Limit use to 1/2 tablet or 1 tablet per 24 hours. 03/17/22   Camillia Herter, NP      Allergies    Metformin and related    Review of Systems   Review of Systems  Physical Exam Updated Vital Signs BP 139/84 (BP Location: Left Arm)   Pulse 82   Temp 98.2 F (36.8 C) (Oral)   Resp 18   Ht 5\' 10"  (1.778 m)   Wt 108.9 kg   SpO2 97%   BMI 34.44 kg/m  Physical Exam Vitals and nursing note reviewed.  Constitutional:  Appearance: He is well-developed.  HENT:     Head: Normocephalic and atraumatic.     Jaw: No trismus.     Right Ear: Tympanic membrane, ear canal and external ear normal.     Left Ear: Tympanic membrane, ear canal and external ear normal.     Nose: Nose normal. No mucosal edema or rhinorrhea.     Mouth/Throat:     Mouth: Mucous membranes are not dry.     Pharynx: Uvula midline. Pharyngeal swelling, oropharyngeal exudate and posterior oropharyngeal erythema present. No uvula swelling.     Tonsils: No tonsillar abscesses.     Comments: No sign of peritonsillar abscess Eyes:     General:        Right eye: No discharge.        Left eye: No discharge.     Conjunctiva/sclera: Conjunctivae normal.  Cardiovascular:     Rate  and Rhythm: Normal rate and regular rhythm.     Heart sounds: Normal heart sounds.  Pulmonary:     Effort: Pulmonary effort is normal. No respiratory distress.     Breath sounds: Normal breath sounds. No wheezing or rales.  Abdominal:     Palpations: Abdomen is soft.     Tenderness: There is no abdominal tenderness.  Musculoskeletal:     Cervical back: Normal range of motion and neck supple.  Lymphadenopathy:     Cervical: Cervical adenopathy present.  Skin:    General: Skin is warm and dry.  Neurological:     Mental Status: He is alert.     ED Results / Procedures / Treatments   Labs (all labs ordered are listed, but only abnormal results are displayed) Labs Reviewed  SARS CORONAVIRUS 2 BY RT PCR  GROUP A STREP BY PCR    EKG None  Radiology No results found.  Procedures Procedures    Medications Ordered in ED Medications - No data to display  ED Course/ Medical Decision Making/ A&P    Patient seen and examined. History obtained directly from patient. Work-up including labs, imaging, EKG ordered in triage, if performed, were reviewed.    Labs/EKG: Independently reviewed and interpreted.  This included: Strep test, COVID test were negative  Imaging: None ordered  Medications/Fluids: None ordered.  Considered Toradol however patient is on anticoagulation.  Consider steroids, however patient is a diabetic.  Would prefer to avoid these medications at this time.  Most recent vital signs reviewed and are as follows: BP 139/84 (BP Location: Left Arm)   Pulse 82   Temp 98.2 F (36.8 C) (Oral)   Resp 18   Ht 5\' 10"  (1.778 m)   Wt 108.9 kg   SpO2 97%   BMI 34.44 kg/m   Initial impression: Tonsillitis, centor 4/4.   Home treatment plan: Discussed risks and benefits of antibiotics for tonsillitis.  Discussed potential for GI upset, diarrhea, colitis.  Patient would prefer to have a prescription.  He has high risk Centor score.  Will give rx amox.   Return  instructions discussed with patient: Inability to swallow, severe pain, persistent vomiting  Follow-up instructions discussed with patient: Follow-up with PCP for recheck in 3 to 5 days if not improving.                          Medical Decision Making Risk Prescription drug management.   In regards to the patient's sore throat today, the following dangerous and potentially life threatening etiologies were considered  on the differential diagnosis: Lugwig's angina, uvulitis, epiglottis, peritonsillar abscess, retropharyngeal abscess, Lemierre's syndrome. Also considered were more common causes such as: streptococcal pharyngitis, gonococcal pharyngitis, non-bacterial pharyngitis (cold viruses, HSV/coxsackievirus, influenza, COVID-19, infectious mononucleosis, oropharyngeal candidiasis), and other non-infectious causes including seasonal allergies/post-nasal drip, GERD/esophagitis, trauma.   The patient's vital signs, pertinent lab work and imaging were reviewed and interpreted as discussed in the ED course. Hospitalization was considered for further testing, treatments, or serial exams/observation. However as patient is well-appearing, has a stable exam, and reassuring studies today, I do not feel that they warrant admission at this time. This plan was discussed with the patient who verbalizes agreement and comfort with this plan and seems reliable and able to return to the Emergency Department with worsening or changing symptoms.          Final Clinical Impression(s) / ED Diagnoses Final diagnoses:  Tonsillitis    Rx / DC Orders ED Discharge Orders          Ordered    amoxicillin (AMOXIL) 500 MG capsule  Daily        06/03/22 1251              Carlisle Cater, PA-C 06/03/22 1258    Fransico Meadow, MD 06/11/22 [phone]

## 2022-06-03 NOTE — ED Triage Notes (Signed)
Patient c/o sinus headache and sore throat onset Tuesday night. Patient also wants his A1C and blood sugar checked.

## 2022-06-03 NOTE — Discharge Instructions (Addendum)
Please read and follow all provided instructions.  Your diagnoses today include:  1. Tonsillitis     Tests performed today include: Strep test: neg for covid and strep Vital signs. See below for your results today.   Medications prescribed:  Amoxicillin - antibiotic  You have been prescribed an antibiotic medicine: take the entire course of medicine even if you are feeling better. Stopping early can cause the antibiotic not to work.  Take any medications prescribed only as directed.   Home care instructions:  Please read the educational materials provided and follow any instructions contained in this packet.  Follow-up instructions: Please follow-up with your primary care provider as needed for further evaluation of your symptoms.  Return instructions:  Please return to the Emergency Department if you experience worsening symptoms.  Return if you have worsening problems swallowing, your neck becomes swollen, you cannot swallow your saliva or your voice becomes muffled.  Return with high persistent fever, persistent vomiting, or if you have trouble breathing.  Please return if you have any other emergent concerns.  Additional Information:  Your vital signs today were: BP 139/84 (BP Location: Left Arm)   Pulse 82   Temp 98.2 F (36.8 C) (Oral)   Resp 18   Ht 5\' 10"  (1.778 m)   Wt 108.9 kg   SpO2 97%   BMI 34.44 kg/m  If your blood pressure (BP) was elevated above 135/85 this visit, please have this repeated by your doctor within one month. --------------

## 2022-06-07 NOTE — Progress Notes (Signed)
Patient ID: [name] [name], male    DOB: 01-29-1966  MRN: [id number]  CC: Chronic Care Management   Subjective: [name] [name] is a 56 y.o. male who presents for chronic care management.   His concerns today include:  06/03/2022 [facility] per MD note: Medical Decision Making Risk Prescription drug management.   In regards to the patient's sore throat today, the following dangerous and potentially life threatening etiologies were considered on the differential diagnosis: Lugwig's angina, uvulitis, epiglottis, peritonsillar abscess, retropharyngeal abscess, Lemierre's syndrome. Also considered were more common causes such as: streptococcal pharyngitis, gonococcal pharyngitis, non-bacterial pharyngitis (cold viruses, HSV/coxsackievirus, influenza, COVID-19, infectious mononucleosis, oropharyngeal candidiasis), and other non-infectious causes including seasonal allergies/post-nasal drip, GERD/esophagitis, trauma.    The patient's vital signs, pertinent lab work and imaging were reviewed and interpreted as discussed in the ED course. Hospitalization was considered for further testing, treatments, or serial exams/observation. However as patient is well-appearing, has a stable exam, and reassuring studies today, I do not feel that they warrant admission at this time. This plan was discussed with the patient who verbalizes agreement and comfort with this plan and seems reliable and able to return to the Emergency Department with worsening or changing symptoms.   Final Clinical Impression(s) / ED Diagnoses Final diagnoses:  Tonsillitis      Rx / DC Orders ED Discharge Orders             Ordered      amoxicillin (AMOXIL) 500 MG capsule  Daily        06/03/22 1251      Today's visit 06/16/2022: Doing well on diabetes medications. Trying to monitor what he eats. Exercising routinely. Sore throat persisting. He completed Amoxicillin. Using over-the-counter  medications to help.   Patient Active Problem List   Diagnosis Date Noted   Hyperlipidemia 11/30/2020   Persistent atrial fibrillation (HCC)    Paroxysmal atrial fibrillation (HCC) 05/29/2019   Substance abuse (HCC)    Diastolic CHF, chronic    Diastolic CHF, chronic (HCC) 10/24/2011   Atrial flutter (HCC) 10/22/2011   Community acquired bacterial pneumonia 10/21/2011   SOB (shortness of breath) 10/21/2011   N&V (nausea and vomiting) 10/21/2011   ARF (acute renal failure) (HCC) 10/21/2011   Tobacco abuse 10/21/2011   Leukocytosis 10/21/2011   Fever 10/21/2011     Current Outpatient Medications on File Prior to Visit  Medication Sig Dispense Refill   acetaminophen (TYLENOL) 500 MG tablet Take 1,000 mg by mouth as needed for mild pain, moderate pain or headache.     apixaban (ELIQUIS) 5 MG TABS tablet Take 1 tablet (5 mg total) by mouth 2 (two) times daily. 180 tablet 3   atorvastatin (LIPITOR) 40 MG tablet TAKE 1 TABLET BY MOUTH DAILY 90 tablet 3   cyclobenzaprine (FLEXERIL) 10 MG tablet Take 1 tablet (10 mg total) by mouth 2 (two) times daily as needed for muscle spasms. 20 tablet 0   diltiazem (CARDIZEM CD) 120 MG 24 hr capsule Take 1 capsule (120 mg total) by mouth 2 (two) times daily. 180 capsule 3   flecainide (TAMBOCOR) 100 MG tablet Take 1 tablet (100 mg total) by mouth 2 (two) times daily. 180 tablet 3   HYDROcodone-acetaminophen (NORCO/VICODIN) 5-325 MG tablet Take 1 tablet by mouth every 4 (four) hours as needed.     lidocaine (LIDODERM) 5 % Place 1 patch onto the skin daily. Remove & Discard patch within 12 hours or as directed by MD 30 patch  0   naproxen (NAPROSYN) 500 MG tablet Take 1 tablet (500 mg total) by mouth 2 (two) times daily. 30 tablet 0   sildenafil (VIAGRA) 50 MG tablet Take 50 mg (1 tablet total) 1/2 hour to 1 hour prior to intercourse as needed. Limit use to 1/2 tablet or 1 tablet per 24 hours. (Patient not taking: Reported on 06/09/2022) 30 tablet 2   No  current facility-administered medications on file prior to visit.    Allergies  Allergen Reactions   Metformin And Related Other (See Comments)    Pt endorses blurred vision with metformin use.     Social History   Socioeconomic History   Marital status: Married    Spouse name: Not on file   Number of children: Not on file   Years of education: Not on file   Highest education level: Not on file  Occupational History   Not on file  Tobacco Use   Smoking status: Former    Types: Cigarettes    Passive exposure: Past   Smokeless tobacco: Never   Tobacco comments:    Former Smoker Quit April 2013 08/12/21  Vaping Use   Vaping Use: Never used  Substance and Sexual Activity   Alcohol use: Not Currently   Drug use: Not Currently   Sexual activity: Not on file  Other Topics Concern   Not on file  Social History Narrative   Not on file   Social Determinants of Health   Financial Resource Strain: Not on file  Food Insecurity: Not on file  Transportation Needs: Not on file  Physical Activity: Not on file  Stress: Not on file  Social Connections: Not on file  Intimate Partner Violence: Not on file    Family History  Problem Relation Age of Onset   Heart disease Neg Hx     Past Surgical History:  Procedure Laterality Date   CARDIOVERSION N/A 10/29/2020   Procedure: CARDIOVERSION;  Surgeon: Nahser, Deloris Ping, MD;  Location: [facility];  Service: Cardiovascular;  Laterality: N/A;    ROS: Review of Systems Negative except as stated above  PHYSICAL EXAM: BP 133/68   Pulse 68   Temp 98 F (36.7 C)   Resp 16   Ht 5\' 10"  (1.778 m)   Wt 230 lb (104.3 kg)   SpO2 94%   BMI 33.00 kg/m    Physical Exam HENT:     Head: Normocephalic and atraumatic.     Nose: Nose normal.     Mouth/Throat:     Mouth: Mucous membranes are moist.     Pharynx: Oropharynx is clear.  Eyes:     Extraocular Movements: Extraocular movements intact.     Conjunctiva/sclera: Conjunctivae  normal.     Pupils: Pupils are equal, round, and reactive to light.  Cardiovascular:     Rate and Rhythm: Normal rate and regular rhythm.     Pulses: Normal pulses.     Heart sounds: Normal heart sounds.  Pulmonary:     Effort: Pulmonary effort is normal.     Breath sounds: Normal breath sounds.  Musculoskeletal:     Cervical back: Normal range of motion and neck supple.  Neurological:     General: No focal deficit present.     Mental Status: He is alert and oriented to person, place, and time.  Psychiatric:        Mood and Affect: Mood normal.        Behavior: Behavior normal.    Results for  orders placed or performed in visit on 06/16/22  POCT glycosylated hemoglobin (Hb A1C)  Result Value Ref Range   Hemoglobin A1C 6.8 (A) 4.0 - 5.6 %   HbA1c POC (<> result, manual entry)     HbA1c, POC (prediabetic range)     HbA1c, POC (controlled diabetic range)      ASSESSMENT AND PLAN: 1. Type 2 diabetes mellitus without complication, without long-term current use of insulin (HCC) - Hemoglobin A1c at goal at 6.8%, goal 7%.  - Continue Dapagliflozin Propanediol and Sitagliptin as prescribed.  - Discussed the importance of healthy eating habits, low-carbohydrate diet, low-sugar diet, regular aerobic exercise (at least 150 minutes a week as tolerated) and medication compliance to achieve or maintain control of diabetes. - Follow-up with primary provider in 3 months or sooner if needed.  - POCT glycosylated hemoglobin (Hb A1C) - dapagliflozin propanediol (FARXIGA) 10 MG TABS tablet; Take 1 tablet (10 mg total) by mouth daily before breakfast.  Dispense: 30 tablet; Refill: 2 - sitaGLIPtin (JANUVIA) 25 MG tablet; Take 1 tablet (25 mg total) by mouth daily.  Dispense: 30 tablet; Refill: 2  2. Tonsillitis - Prednisone as prescribed.  - Referral to ENT for further evaluation/management. - predniSONE (DELTASONE) 10 MG tablet; Take 6 tablets (60 mg total) by mouth daily with breakfast for 1 day,  THEN 5 tablets (50 mg total) daily with breakfast for 1 day, THEN 4 tablets (40 mg total) daily with breakfast for 1 day, THEN 3 tablets (30 mg total) daily with breakfast for 1 day, THEN 2 tablets (20 mg total) daily with breakfast for 1 day, THEN 1 tablet (10 mg total) daily with breakfast for 1 day.  Dispense: 21 tablet; Refill: 0 - Ambulatory referral to ENT   Patient was given the opportunity to ask questions.  Patient verbalized understanding of the plan and was able to repeat key elements of the plan. Patient was given clear instructions to go to Emergency Department or return to medical center if symptoms don't improve, worsen, or new problems develop.The patient verbalized understanding.   Orders Placed This Encounter  Procedures   Ambulatory referral to ENT   POCT glycosylated hemoglobin (Hb A1C)     Requested Prescriptions   Signed Prescriptions Disp Refills   dapagliflozin propanediol (FARXIGA) 10 MG TABS tablet 30 tablet 2    Sig: Take 1 tablet (10 mg total) by mouth daily before breakfast.   sitaGLIPtin (JANUVIA) 25 MG tablet 30 tablet 2    Sig: Take 1 tablet (25 mg total) by mouth daily.   predniSONE (DELTASONE) 10 MG tablet 21 tablet 0    Sig: Take 6 tablets (60 mg total) by mouth daily with breakfast for 1 day, THEN 5 tablets (50 mg total) daily with breakfast for 1 day, THEN 4 tablets (40 mg total) daily with breakfast for 1 day, THEN 3 tablets (30 mg total) daily with breakfast for 1 day, THEN 2 tablets (20 mg total) daily with breakfast for 1 day, THEN 1 tablet (10 mg total) daily with breakfast for 1 day.    Return in about 3 months (around 09/15/2022) for Follow-Up or next available chronic care mgmt.  Camillia Herter, NP

## 2022-06-09 ENCOUNTER — Encounter: Payer: Self-pay | Admitting: Cardiology

## 2022-06-09 ENCOUNTER — Ambulatory Visit: Payer: BC Managed Care – PPO | Attending: Cardiology | Admitting: Cardiology

## 2022-06-09 VITALS — BP 132/72 | HR 67 | Ht 70.0 in | Wt 236.0 lb

## 2022-06-09 DIAGNOSIS — G4733 Obstructive sleep apnea (adult) (pediatric): Secondary | ICD-10-CM | POA: Diagnosis not present

## 2022-06-09 NOTE — Patient Instructions (Signed)
Medication Instructions:  Your physician recommends that you continue on your current medications as directed. Please refer to the Current Medication list given to you today.  *If you need a refill on your cardiac medications before your next appointment, please call your pharmacy*   Lab Work: None If you have labs (blood work) drawn today and your tests are completely normal, you will receive your results only by: MyChart Message (if you have MyChart) OR A paper copy in the mail If you have any lab test that is abnormal or we need to change your treatment, we will call you to review the results.   Testing/Procedures: Your physician has recommended that you have a Split Night Sleep Study. This test records several body functions during sleep, including: brain activity, eye movement, oxygen and carbon dioxide blood levels, heart rate and rhythm, breathing rate and rhythm, the flow of air through your mouth and nose, snoring, body muscle movements, and chest and belly movement.   Follow-Up: To be determined after the Sleep Study is complete  Important Information About Sugar

## 2022-06-09 NOTE — Progress Notes (Signed)
Sleep Medicine CONSULT Note    Date:  06/09/2022   ID:  [name] [name], DOB 10-05-1965, MRN [id number]  PCP:  Rema Fendt, NP  Cardiologist:  Armanda Magic, MD   Chief Complaint  Patient presents with   New Patient (Initial Visit)    Obstructive sleep apnea    History of Present Illness:  [name] [name] is a 56 y.o. male who is being seen today for the evaluation of OSA at the request of Alphonzo Severance, PA.  This is a 56yo AAM with a hx of PAF and atrial flutter followed in A-fib clinic.  He also has a history of severe obstructive sleep but never pursued needed with getting his device after his sleep study.    His initial study was done in January 2021 which demonstrated severe obstructive sleep apnea with an AHI of 33.9/h with no central events.  His RDI was 44.7/h.  AHI during REM sleep was 61.9/h and AHI during supine sleep was 89/h.  He underwent CPAP titration to 12 cm H2O and CPAP was ordered but he never proceeded with it.  He is now referred to establish with a sleep physician then for further evaluation and treatment of obstructive sleep apnea  given his atrial arrhythmias.  He tells me that if he gets enough hours of sleep he feels fine in the am but if he does not get enough hours of sleep he will be sleepy and feel like he is going to fall asleep on the fork lift.  His wife says that he snores but no one has ever told him he stops breathing.  He denies any am HAs.  Stop Bang score is 5.  Past Medical History:  Diagnosis Date   ARF (acute renal failure) (HCC)    Atrial flutter (HCC)    Community acquired bacterial pneumonia    Diastolic CHF, chronic (HCC)    Leukocytosis    N&V (nausea and vomiting)    SOB (shortness of breath)    Substance abuse (HCC)    Prior hx of cocaine (quit 5 years ago)/EtOH (clean for 14 months) as of 10/2011    Past Surgical History:  Procedure Laterality Date   CARDIOVERSION N/A 10/29/2020   Procedure: CARDIOVERSION;  Surgeon: Vesta Mixer, MD;  Location: [facility];  Service: Cardiovascular;  Laterality: N/A;    Current Medications: Current Meds  Medication Sig   acetaminophen (TYLENOL) 500 MG tablet Take 1,000 mg by mouth as needed for mild pain, moderate pain or headache.   amoxicillin (AMOXIL) 500 MG capsule Take 2 capsules (1,000 mg total) by mouth daily for 7 days.   apixaban (ELIQUIS) 5 MG TABS tablet Take 1 tablet (5 mg total) by mouth 2 (two) times daily.   atorvastatin (LIPITOR) 40 MG tablet TAKE 1 TABLET BY MOUTH DAILY   cyclobenzaprine (FLEXERIL) 10 MG tablet Take 1 tablet (10 mg total) by mouth 2 (two) times daily as needed for muscle spasms.   dapagliflozin propanediol (FARXIGA) 10 MG TABS tablet Take 1 tablet (10 mg total) by mouth daily before breakfast.   diltiazem (CARDIZEM CD) 120 MG 24 hr capsule Take 1 capsule (120 mg total) by mouth 2 (two) times daily.   flecainide (TAMBOCOR) 100 MG tablet Take 1 tablet (100 mg total) by mouth 2 (two) times daily.   HYDROcodone-acetaminophen (NORCO/VICODIN) 5-325 MG tablet Take 1 tablet by mouth every 4 (four) hours as needed.   JANUVIA 25 MG tablet TAKE 1 TABLET BY  MOUTH DAILY   lidocaine (LIDODERM) 5 % Place 1 patch onto the skin daily. Remove & Discard patch within 12 hours or as directed by MD   naproxen (NAPROSYN) 500 MG tablet Take 1 tablet (500 mg total) by mouth 2 (two) times daily.    Allergies:   Metformin and related   Social History   Socioeconomic History   Marital status: Married    Spouse name: Not on file   Number of children: Not on file   Years of education: Not on file   Highest education level: Not on file  Occupational History   Not on file  Tobacco Use   Smoking status: Former    Types: Cigarettes    Passive exposure: Past   Smokeless tobacco: Never   Tobacco comments:    Former Smoker Quit April 2013 08/12/21  Vaping Use   Vaping Use: Never used  Substance and Sexual Activity   Alcohol use: Not Currently   Drug use: Not  Currently   Sexual activity: Not on file  Other Topics Concern   Not on file  Social History Narrative   Not on file   Social Determinants of Health   Financial Resource Strain: Not on file  Food Insecurity: Not on file  Transportation Needs: Not on file  Physical Activity: Not on file  Stress: Not on file  Social Connections: Not on file     Family History:  The patient's family history is not on file.   ROS:   Please see the history of present illness.    ROS All other systems reviewed and are negative.      No data to display             PHYSICAL EXAM:   VS:  BP 132/72   Pulse 67   Ht 5\' 10"  (1.778 m)   Wt 236 lb (107 kg)   SpO2 95%   BMI 33.86 kg/m    GEN: Well nourished, well developed, in no acute distress  HEENT: normal  Neck: no JVD, carotid bruits, or masses Cardiac: RRR; no murmurs, rubs, or gallops,no edema.  Intact distal pulses bilaterally.  Respiratory:  clear to auscultation bilaterally, normal work of breathing GI: soft, nontender, nondistended, + BS MS: no deformity or atrophy  Skin: warm and dry, no rash Neuro:  Alert and Oriented x 3, Strength and sensation are intact Psych: euthymic mood, full affect  Wt Readings from Last 3 Encounters:  06/09/22 236 lb (107 kg)  06/03/22 240 lb (108.9 kg)  03/17/22 238 lb (108 kg)      Studies/Labs Reviewed:   Split-night sleep study  Recent Labs: 11/25/2021: BUN 12; Creatinine, Ser 1.25; Potassium 4.5; Sodium 140    CHA2DS2-VASc Score = 1   This indicates a 0.6% annual risk of stroke. The patient's score is based upon: CHF History: 0 HTN History: 0 Diabetes History: 1 Stroke History: 0 Vascular Disease History: 0 Age Score: 0 Gender Score: 0            Additional studies/ records that were reviewed today include:  Split-night sleep study 2021 and A-fib clinic notes    ASSESSMENT:    1. OSA (obstructive sleep apnea)      PLAN:  In order of problems listed above:  OSA   -The patient has a history of severe obstructive sleep apnea with an AHI of 33.9 by sleep study in 2021 and CPAP was ordered but patient never followed through -Given his atrial  arrhythmias and the concern that his untreated sleep apnea is driving his A-fib recommend repeating a sleep study and getting on therapy for obstructive sleep apnea -I will order a split-night sleep study  Time Spent: 20 minutes total time of encounter, including 15 minutes spent in face-to-face patient care on the date of this encounter. This time includes coordination of care and counseling regarding above mentioned problem list. Remainder of non-face-to-face time involved reviewing chart documents/testing relevant to the patient encounter and documentation in the medical record. I have independently reviewed documentation from referring provider  Medication Adjustments/Labs and Tests Ordered: Current medicines are reviewed at length with the patient today.  Concerns regarding medicines are outlined above.  Medication changes, Labs and Tests ordered today are listed in the Patient Instructions below.  There are no Patient Instructions on file for this visit.   Signed, Fransico Him, MD  06/09/2022 9:01 AM    [address], [place],   [address] Phone: [phone]; Fax: [phone]

## 2022-06-09 NOTE — Addendum Note (Signed)
Addended by: Daleen Bo I on: 06/09/2022 09:11 AM   Modules accepted: Orders

## 2022-06-16 ENCOUNTER — Ambulatory Visit (INDEPENDENT_AMBULATORY_CARE_PROVIDER_SITE_OTHER): Payer: BC Managed Care – PPO | Admitting: Family

## 2022-06-16 VITALS — BP 133/68 | HR 68 | Temp 98.0°F | Resp 16 | Ht 70.0 in | Wt 230.0 lb

## 2022-06-16 DIAGNOSIS — E119 Type 2 diabetes mellitus without complications: Secondary | ICD-10-CM | POA: Diagnosis not present

## 2022-06-16 DIAGNOSIS — J039 Acute tonsillitis, unspecified: Secondary | ICD-10-CM

## 2022-06-16 DIAGNOSIS — N529 Male erectile dysfunction, unspecified: Secondary | ICD-10-CM

## 2022-06-16 LAB — POCT GLYCOSYLATED HEMOGLOBIN (HGB A1C): Hemoglobin A1C: 6.8 % — AB (ref 4.0–5.6)

## 2022-06-16 MED ORDER — DAPAGLIFLOZIN PROPANEDIOL 10 MG PO TABS: 10.0000 mg | ORAL_TABLET | Freq: Every day | ORAL | 2 refills | Status: DC

## 2022-06-16 MED ORDER — SITAGLIPTIN PHOSPHATE 25 MG PO TABS: 25.0000 mg | ORAL_TABLET | Freq: Every day | ORAL | 2 refills | Status: DC

## 2022-06-16 MED ORDER — PREDNISONE 10 MG PO TABS: ORAL_TABLET | ORAL | 0 refills | Status: AC

## 2022-06-16 NOTE — Progress Notes (Signed)
.  Pt presents for chronic care management   -pt having complaints of sore throat, received treatment on 11/25, but still sore

## 2022-06-28 NOTE — Progress Notes (Signed)
Patient ID: [name] [name], male    DOB: 20-Jan-1966  MRN: [id number]  CC: Annual Physical Exam  Subjective: [name] [name] is a 56 y.o. male who presents for annual physical exam.   His concerns today include:  - Concern for knot of right groin. Denies red flag symptoms. Comes and goes. Denies any significant pain only mild discomfort. States in the past he developed the same symptom when he had syphilis and would like to be screened today.  - Concern for boil of right inner thigh. Comes and goes. Using over-the-counter ointment to help. States boil not present today. Discussed with patient if this returns to schedule an appointment with me for further evaluation and he is agreeable. - Concern of knot underneath scrotum. Denies red flag symptoms. Comes and goes. Endorses discomfort when drying off with a towel after a shower.    Patient Active Problem List   Diagnosis Date Noted   Hyperlipidemia 11/30/2020   Persistent atrial fibrillation (HCC)    Paroxysmal atrial fibrillation ([place]) 05/29/2019   Substance abuse ([place])    Diastolic CHF, chronic    Diastolic CHF, chronic ([place]) 10/24/2011   Atrial flutter ([place]) 10/22/2011   Community acquired bacterial pneumonia 10/21/2011   SOB (shortness of breath) 10/21/2011   N&V (nausea and vomiting) 10/21/2011   ARF (acute renal failure) ([place]) 10/21/2011   Tobacco abuse 10/21/2011   Leukocytosis 10/21/2011   Fever 10/21/2011     Current Outpatient Medications on File Prior to Visit  Medication Sig Dispense Refill   acetaminophen (TYLENOL) 500 MG tablet Take 1,000 mg by mouth as needed for mild pain, moderate pain or headache.     apixaban (ELIQUIS) 5 MG TABS tablet Take 1 tablet (5 mg total) by mouth 2 (two) times daily. 180 tablet 3   atorvastatin (LIPITOR) 40 MG tablet TAKE 1 TABLET BY MOUTH DAILY 90 tablet 3   cyclobenzaprine (FLEXERIL) 10 MG tablet Take 1 tablet (10 mg total) by mouth 2 (two) times daily as needed for muscle spasms. 20  tablet 0   dapagliflozin propanediol (FARXIGA) 10 MG TABS tablet Take 1 tablet (10 mg total) by mouth daily before breakfast. 30 tablet 2   diltiazem (CARDIZEM CD) 120 MG 24 hr capsule Take 1 capsule (120 mg total) by mouth 2 (two) times daily. 180 capsule 3   flecainide (TAMBOCOR) 100 MG tablet Take 1 tablet (100 mg total) by mouth 2 (two) times daily. 180 tablet 3   HYDROcodone-acetaminophen (NORCO/VICODIN) 5-325 MG tablet Take 1 tablet by mouth every 4 (four) hours as needed.     lidocaine (LIDODERM) 5 % Place 1 patch onto the skin daily. Remove & Discard patch within 12 hours or as directed by MD 30 patch 0   naproxen (NAPROSYN) 500 MG tablet Take 1 tablet (500 mg total) by mouth 2 (two) times daily. 30 tablet 0   sildenafil (VIAGRA) 50 MG tablet Take 50 mg (1 tablet total) 1/2 hour to 1 hour prior to intercourse as needed. Limit use to 1/2 tablet or 1 tablet per 24 hours. (Patient not taking: Reported on 06/09/2022) 30 tablet 2   sitaGLIPtin (JANUVIA) 25 MG tablet Take 1 tablet (25 mg total) by mouth daily. 30 tablet 2   No current facility-administered medications on file prior to visit.    Allergies  Allergen Reactions   Metformin And Related Other (See Comments)    Pt endorses blurred vision with metformin use.     Social History   Socioeconomic History  Marital status: Married    Spouse name: Not on file   Number of children: Not on file   Years of education: Not on file   Highest education level: Not on file  Occupational History   Not on file  Tobacco Use   Smoking status: Former    Types: Cigarettes    Passive exposure: Past   Smokeless tobacco: Never   Tobacco comments:    Former Smoker Quit April 2013 08/12/21  Vaping Use   Vaping Use: Never used  Substance and Sexual Activity   Alcohol use: Not Currently   Drug use: Not Currently   Sexual activity: Not on file  Other Topics Concern   Not on file  Social History Narrative   Not on file   Social  Determinants of Health   Financial Resource Strain: Not on file  Food Insecurity: Not on file  Transportation Needs: Not on file  Physical Activity: Not on file  Stress: Not on file  Social Connections: Not on file  Intimate Partner Violence: Not on file    Family History  Problem Relation Age of Onset   Heart disease Neg Hx     Past Surgical History:  Procedure Laterality Date   CARDIOVERSION N/A 10/29/2020   Procedure: CARDIOVERSION;  Surgeon: Nahser, Wonda Cheng, MD;  Location: [facility];  Service: Cardiovascular;  Laterality: N/A;    ROS: Review of Systems Negative except as stated above  PHYSICAL EXAM: BP 119/81 (BP Location: Left Arm, Patient Position: Sitting, Cuff Size: Large)   Pulse 81   Temp 98.3 F (36.8 C)   Resp 16   Ht _0  (1.778 m)   Wt 228 lb (103.4 kg)   SpO2 98%   BMI 32.71 kg/m   Physical Exam HENT:     Head: Normocephalic and atraumatic.     Right Ear: Tympanic membrane, ear canal and external ear normal.     Left Ear: Tympanic membrane, ear canal and external ear normal.     Nose: Nose normal.     Mouth/Throat:     Mouth: Mucous membranes are moist.     Pharynx: Oropharynx is clear.  Eyes:     Extraocular Movements: Extraocular movements intact.     Conjunctiva/sclera: Conjunctivae normal.     Pupils: Pupils are equal, round, and reactive to light.  Cardiovascular:     Rate and Rhythm: Normal rate and regular rhythm.     Pulses: Normal pulses.     Heart sounds: Normal heart sounds.  Pulmonary:     Effort: Pulmonary effort is normal.     Breath sounds: Normal breath sounds.  Abdominal:     General: Bowel sounds are normal.  Genitourinary:    Comments: Patient declined.  Musculoskeletal:        General: Normal range of motion.     Right shoulder: Normal.     Left shoulder: Normal.     Right upper arm: Normal.     Left upper arm: Normal.     Right elbow: Normal.     Left elbow: Normal.     Right forearm: Normal.     Left  forearm: Normal.     Right wrist: Normal.     Left wrist: Normal.     Right hand: Normal.     Left hand: Normal.     Cervical back: Normal, normal range of motion and neck supple.     Thoracic back: Normal.     Lumbar back: Normal.  Right hip: Normal.     Left hip: Normal.     Right upper leg: Normal.     Left upper leg: Normal.     Right knee: Normal.     Left knee: Normal.     Right lower leg: Normal.     Left lower leg: Normal.     Right ankle: Normal.     Left ankle: Normal.     Right foot: Normal.     Left foot: Normal.  Skin:    General: Skin is warm and dry.     Capillary Refill: Capillary refill takes less than 2 seconds.  Neurological:     General: No focal deficit present.     Mental Status: He is alert and oriented to person, place, and time.  Psychiatric:        Mood and Affect: Mood normal.        Behavior: Behavior normal.    ASSESSMENT AND PLAN: 1. Annual physical exam - Counseled on 150 minutes of exercise per week as tolerated, healthy eating (including decreased daily intake of saturated fats, cholesterol, added sugars, sodium), STI prevention, and routine healthcare maintenance.  2. Screening for metabolic disorder - Routine screening.  - CMP14+EGFR  3. Screening for deficiency anemia - Routine screening.  - CBC  4. Thyroid disorder screen - Routine screening.  - TSH  5. Colon cancer screening - Referral to Gastroenterology for colon cancer screening by colonoscopy. - Ambulatory referral to Gastroenterology  6. Type 2 diabetes mellitus without complication, without long-term current use of insulin (HCC) - Routine screening.  - Microalbumin / creatinine urine ratio  7. History of syphilis - Routine screening.  - RPR  8. Routine screening for STI (sexually transmitted infection) - Routine screening.  - HSV(herpes simplex vrs) 1+2 ab-IgG - Urine cytology ancillary only  9. Acute pain in scrotum - Ultrasound scrotum for further  evaluation. - US Scrotum; Future    Patient was given the opportunity to ask questions.  Patient verbalized understanding of the plan and was able to repeat key elements of the plan. Patient was given clear instructions to go to Emergency Department or return to medical center if symptoms don't improve, worsen, or new problems develop.The patient verbalized understanding.   Orders Placed This Encounter  Procedures   Microalbumin / creatinine urine ratio   CMP14+EGFR   CBC   HSV(herpes simplex vrs) 1+2 ab-IgG   RPR   Ambulatory referral to Gastroenterology     Return in about 1 year (around 07/08/2023) for Physical per patient preference.  Camillia Herter, NP

## 2022-07-07 ENCOUNTER — Other Ambulatory Visit (HOSPITAL_COMMUNITY)
Admission: RE | Admit: 2022-07-07 | Discharge: 2022-07-07 | Disposition: A | Discharge status: Home / Self Care | Payer: BC Managed Care – PPO | Source: Ambulatory Visit | Attending: Family | Admitting: Family

## 2022-07-07 ENCOUNTER — Ambulatory Visit (INDEPENDENT_AMBULATORY_CARE_PROVIDER_SITE_OTHER): Payer: BC Managed Care – PPO | Admitting: Family

## 2022-07-07 ENCOUNTER — Encounter: Payer: Self-pay | Admitting: Family

## 2022-07-07 VITALS — BP 119/81 | HR 81 | Temp 98.3°F | Resp 16 | Ht 70.0 in | Wt 228.0 lb

## 2022-07-07 DIAGNOSIS — Z13228 Encounter for screening for other metabolic disorders: Secondary | ICD-10-CM

## 2022-07-07 DIAGNOSIS — E119 Type 2 diabetes mellitus without complications: Secondary | ICD-10-CM | POA: Diagnosis not present

## 2022-07-07 DIAGNOSIS — B009 Herpesviral infection, unspecified: Secondary | ICD-10-CM | POA: Diagnosis not present

## 2022-07-07 DIAGNOSIS — Z0000 Encounter for general adult medical examination without abnormal findings: Secondary | ICD-10-CM

## 2022-07-07 DIAGNOSIS — N5082 Scrotal pain: Secondary | ICD-10-CM | POA: Diagnosis not present

## 2022-07-07 DIAGNOSIS — Z0001 Encounter for general adult medical examination with abnormal findings: Secondary | ICD-10-CM

## 2022-07-07 DIAGNOSIS — Z1329 Encounter for screening for other suspected endocrine disorder: Secondary | ICD-10-CM

## 2022-07-07 DIAGNOSIS — Z8619 Personal history of other infectious and parasitic diseases: Secondary | ICD-10-CM

## 2022-07-07 DIAGNOSIS — Z1211 Encounter for screening for malignant neoplasm of colon: Secondary | ICD-10-CM

## 2022-07-07 DIAGNOSIS — Z130 Encounter for screening for diseases of the blood and blood-forming organs and certain disorders involving the immune mechanism: Secondary | ICD-10-CM | POA: Diagnosis not present

## 2022-07-07 DIAGNOSIS — Z113 Encounter for screening for infections with a predominantly sexual mode of transmission: Secondary | ICD-10-CM

## 2022-07-07 NOTE — Patient Instructions (Signed)

## 2022-07-07 NOTE — Progress Notes (Signed)
.  Pt presents for annual physical exam  

## 2022-07-08 ENCOUNTER — Other Ambulatory Visit: Payer: Self-pay | Admitting: Family

## 2022-07-08 DIAGNOSIS — D751 Secondary polycythemia: Secondary | ICD-10-CM

## 2022-07-08 DIAGNOSIS — B009 Herpesviral infection, unspecified: Secondary | ICD-10-CM

## 2022-07-08 LAB — CBC
Hematocrit: 47.6 % (ref 37.5–51.0)
Hemoglobin: 14.6 g/dL (ref 13.0–17.7)
MCH: 21.2 pg — ABNORMAL LOW (ref 26.6–33.0)
MCHC: 30.7 g/dL — ABNORMAL LOW (ref 31.5–35.7)
MCV: 69 fL — ABNORMAL LOW (ref 79–97)
Platelets: 265 10*3/uL (ref 150–450)
RBC: 6.88 x10E6/uL — ABNORMAL HIGH (ref 4.14–5.80)
RDW: 18.8 % — ABNORMAL HIGH (ref 11.6–15.4)
WBC: 5.7 10*3/uL (ref 3.4–10.8)

## 2022-07-08 LAB — CMP14+EGFR
ALT: 29 IU/L (ref 0–44)
AST: 20 IU/L (ref 0–40)
Albumin/Globulin Ratio: 1.5 (ref 1.2–2.2)
Albumin: 4.4 g/dL (ref 3.8–4.9)
Alkaline Phosphatase: 120 IU/L (ref 44–121)
BUN/Creatinine Ratio: 6 — ABNORMAL LOW (ref 9–20)
BUN: 7 mg/dL (ref 6–24)
Bilirubin Total: 0.3 mg/dL (ref 0.0–1.2)
CO2: 20 mmol/L (ref 20–29)
Calcium: 9.3 mg/dL (ref 8.7–10.2)
Chloride: 103 mmol/L (ref 96–106)
Creatinine, Ser: 1.1 mg/dL (ref 0.76–1.27)
Globulin, Total: 2.9 g/dL (ref 1.5–4.5)
Glucose: 95 mg/dL (ref 70–99)
Potassium: 4.9 mmol/L (ref 3.5–5.2)
Sodium: 140 mmol/L (ref 134–144)
Total Protein: 7.3 g/dL (ref 6.0–8.5)
eGFR: 80 mL/min/{1.73_m2} (ref >59)

## 2022-07-08 LAB — MICROALBUMIN / CREATININE URINE RATIO
Creatinine, Urine: 106.7 mg/dL
Microalb/Creat Ratio: 29 mg/g creat (ref 0–29)
Microalbumin, Urine: 30.7 ug/mL

## 2022-07-08 LAB — HSV 1 AND 2 AB, IGG
HSV 1 Glycoprotein G Ab, IgG: >62.2 index — ABNORMAL HIGH (ref 0.00–0.90)
HSV 2 IgG, Type Spec: <0.91 index (ref 0.00–0.90)

## 2022-07-08 LAB — RPR: RPR Ser Ql: NONREACTIVE

## 2022-07-08 MED ORDER — VALACYCLOVIR HCL 1 G PO TABS: 1000.0000 mg | ORAL_TABLET | Freq: Two times a day (BID) | ORAL | 0 refills | Status: DC

## 2022-07-11 ENCOUNTER — Telehealth: Payer: Self-pay | Admitting: Family

## 2022-07-11 LAB — URINE CYTOLOGY ANCILLARY ONLY
Chlamydia: NEGATIVE
Comment: NEGATIVE
Comment: NEGATIVE
Comment: NORMAL
Neisseria Gonorrhea: NEGATIVE
Trichomonas: NEGATIVE

## 2022-07-11 NOTE — Telephone Encounter (Signed)
Spoke w/Cindy adv that provider Minette Brine, NP would like ultrasound w/doppler.

## 2022-07-11 NOTE — Telephone Encounter (Signed)
Cindy with [facility] is calling regarding US Scrotum (Order [id number]) Imag. Needing to know want with [place] or without [place].  [phone] option 1 followed by option 5

## 2022-07-13 ENCOUNTER — Ambulatory Visit (HOSPITAL_BASED_OUTPATIENT_CLINIC_OR_DEPARTMENT_OTHER): Payer: BC Managed Care – PPO | Attending: Cardiology | Admitting: Cardiology

## 2022-07-13 VITALS — Ht 70.0 in | Wt 240.0 lb

## 2022-07-13 DIAGNOSIS — R0683 Snoring: Secondary | ICD-10-CM | POA: Diagnosis not present

## 2022-07-13 DIAGNOSIS — G4733 Obstructive sleep apnea (adult) (pediatric): Secondary | ICD-10-CM | POA: Diagnosis not present

## 2022-07-13 DIAGNOSIS — E669 Obesity, unspecified: Secondary | ICD-10-CM | POA: Insufficient documentation

## 2022-07-13 DIAGNOSIS — Z6834 Body mass index (BMI) 34.0-34.9, adult: Secondary | ICD-10-CM | POA: Insufficient documentation

## 2022-07-13 DIAGNOSIS — G4736 Sleep related hypoventilation in conditions classified elsewhere: Secondary | ICD-10-CM | POA: Insufficient documentation

## 2022-07-13 DIAGNOSIS — E119 Type 2 diabetes mellitus without complications: Secondary | ICD-10-CM | POA: Diagnosis not present

## 2022-07-14 NOTE — Procedures (Signed)
Patient Name: Arnav, Cregg Date: 07/13/2022 Gender: Male D.O.B: Jun 13, 1966 Age (years): 39 Referring Provider: Fransico Him MD, ABSM Height (inches): 70 Interpreting Physician: Fransico Him MD, ABSM Weight (lbs): 240 RPSGT: Laren Everts BMI: 34 MRN: [id number] Neck Size: 17.50  CLINICAL INFORMATION Sleep Study Type: Split Night CPAP  Indication for sleep study: Diabetes, Obesity, OSA, Snoring  Epworth Sleepiness Score: 8  SLEEP STUDY TECHNIQUE As per the AASM Manual for the Scoring of Sleep and Associated Events v2.3 (April 2016) with a hypopnea requiring 4% desaturations.  The channels recorded and monitored were frontal, central and occipital EEG, electrooculogram (EOG), submentalis EMG (chin), nasal and oral airflow, thoracic and abdominal wall motion, anterior tibialis EMG, snore microphone, electrocardiogram, and pulse oximetry. Continuous positive airway pressure (CPAP) was initiated when the patient met split night criteria and was titrated according to treat sleep-disordered breathing.  MEDICATIONS Medications self-administered by patient taken the night of the study : N/A  RESPIRATORY PARAMETERS Diagnostic Total AHI (/hr): 80.7  RDI (/hr):87.0  OA Index (/hr):2.1  CA Index (/hr): 1.3 REM AHI (/hr):83.3  NREM AHI (/hr):79.4  Supine AHI (/hr):N/A  Non-supine AHI (/hr):80.7 Min O2 Sat (%):79.0  Mean O2 (%):92.0  Time below 88% (min):15.3   Titration Optimal Pressure (cm):14  AHI at Optimal Pressure (/hr):6.8  Min O2 at Optimal Pressure (%):89.0 Supine % at Optimal (%):0  Sleep % at Optimal (%):86   SLEEP ARCHITECTURE The recording time for the entire night was 403.4 minutes.  During a baseline period of 207.5 minutes, the patient slept for 142.7 minutes in REM and nonREM, yielding a sleep efficiency of 68.8%. Sleep onset after lights out was 17.8 minutes with a REM latency of 122.0 minutes. The patient spent 14.4% of the night in stage N1  sleep, 50.8% in stage N2 sleep, 0.0% in stage N3 and 34.8% in REM.  During the titration period of 188.4 minutes, the patient slept for 165.6 minutes in REM and nonREM, yielding a sleep efficiency of 87.9%. Sleep onset after CPAP initiation was 4.8 minutes with a REM latency of 75.5 minutes. The patient spent 5.5% of the night in stage N1 sleep, 74.3% in stage N2 sleep, 0.0% in stage N3 and 20.2% in REM.  CARDIAC DATA The 2 lead EKG demonstrated sinus rhythm. The mean heart rate was 100.0 beats per minute. Other EKG findings include: None.  LEG MOVEMENT DATA The total Periodic Limb Movements of Sleep (PLMS) were 0. The PLMS index was 0.0 .  IMPRESSIONS - Severe obstructive sleep apnea occurred during the diagnostic portion of the study (AHI = 80.7/hour). An optimal PAP pressure was selected for this patient ( 14 cm of water) - No significant central sleep apnea occurred during the diagnostic portion of the study (CAI = 1.3/hour). - Moderate oxygen desaturation was noted during the diagnostic portion of the study (Min O2 =79.0%). - The patient snored with moderate snoring volume during the diagnostic portion of the study. - No cardiac abnormalities were noted during this study. - Clinically significant periodic limb movements did not occur during sleep.  DIAGNOSIS - Obstructive Sleep Apnea (G47.33) - Nocturnal Hypoxemia  RECOMMENDATIONS - Trial of ResMed autoCPAP therapy from 4 to 15 cm H2O with a Medium size Fisher&Paykel Full Face Simplus mask and heated humidification. - Avoid alcohol, sedatives and other CNS depressants that may worsen sleep apnea and disrupt normal sleep architecture. - Sleep hygiene should be reviewed to assess factors that may improve sleep quality. - Weight management and regular exercise  should be initiated or continued. - Return to Sleep Center for re-evaluation after 6 weeks of therapy  [Electronically signed] 07/14/2022 05:58 PM  Fransico Him MD,  ABSM Diplomate, American Board of Sleep Medicine

## 2022-07-18 ENCOUNTER — Other Ambulatory Visit (HOSPITAL_COMMUNITY): Payer: Self-pay

## 2022-07-18 MED ORDER — FLECAINIDE ACETATE 100 MG PO TABS: 100.0000 mg | ORAL_TABLET | Freq: Two times a day (BID) | ORAL | 3 refills | Status: DC

## 2022-07-19 ENCOUNTER — Other Ambulatory Visit (HOSPITAL_COMMUNITY): Payer: Self-pay | Admitting: *Deleted

## 2022-07-19 ENCOUNTER — Ambulatory Visit (INDEPENDENT_AMBULATORY_CARE_PROVIDER_SITE_OTHER): Payer: BC Managed Care – PPO | Admitting: Family

## 2022-07-19 VITALS — BP 119/65 | HR 70 | Temp 98.3°F | Resp 16 | Ht 70.0 in | Wt 238.0 lb

## 2022-07-19 DIAGNOSIS — Z125 Encounter for screening for malignant neoplasm of prostate: Secondary | ICD-10-CM | POA: Diagnosis not present

## 2022-07-19 DIAGNOSIS — R718 Other abnormality of red blood cells: Secondary | ICD-10-CM

## 2022-07-19 DIAGNOSIS — Z712 Person consulting for explanation of examination or test findings: Secondary | ICD-10-CM

## 2022-07-19 MED ORDER — FLECAINIDE ACETATE 100 MG PO TABS
100.0000 mg | ORAL_TABLET | Freq: Two times a day (BID) | ORAL | 2 refills | Status: DC
Start: 2022-07-19 — End: 2023-02-23

## 2022-07-19 NOTE — Progress Notes (Signed)
Patient ID: [name] [name], male    DOB: 14-Jun-1966  MRN: [id number]  CC: Advice Only (Abnormal labs )  Subjective: [name] [name] is a 57 y.o. male who presents for advice only (abnormal labs). He is accompanied by his wife and niece via mobile phone.   His concerns today include:  Patient presents today for concerns of elevated red blood cells from 07/07/2022 lab and being referred to Hematology/Oncology for further evaluation/management. States he became worried when he received a call from the "[facility]" to schedule an appointment. Patient reports he has family history of "different types of cancer". He would like to have his prostate screened on today. Reports is a sickle cell trait carrier. No further issues/concerns.   Patient Active Problem List   Diagnosis Date Noted   OSA (obstructive sleep apnea) 07/13/2022   Herpes 07/08/2022   Hyperlipidemia 11/30/2020   Persistent atrial fibrillation (HCC)    Paroxysmal atrial fibrillation (HCC) 05/29/2019   Substance abuse ([place])    Diastolic CHF, chronic    Diastolic CHF, chronic ([place]) 10/24/2011   Atrial flutter ([place]) 10/22/2011   Community acquired bacterial pneumonia 10/21/2011   SOB (shortness of breath) 10/21/2011   N&V (nausea and vomiting) 10/21/2011   ARF (acute renal failure) ([place]) 10/21/2011   Tobacco abuse 10/21/2011   Leukocytosis 10/21/2011   Fever 10/21/2011     Current Outpatient Medications on File Prior to Visit  Medication Sig Dispense Refill   acetaminophen (TYLENOL) 500 MG tablet Take 1,000 mg by mouth as needed for mild pain, moderate pain or headache.     apixaban (ELIQUIS) 5 MG TABS tablet Take 1 tablet (5 mg total) by mouth 2 (two) times daily. 180 tablet 3   atorvastatin (LIPITOR) 40 MG tablet TAKE 1 TABLET BY MOUTH DAILY 90 tablet 3   cyclobenzaprine (FLEXERIL) 10 MG tablet Take 1 tablet (10 mg total) by mouth 2 (two) times daily as needed for muscle spasms. 20 tablet 0   dapagliflozin propanediol  (FARXIGA) 10 MG TABS tablet Take 1 tablet (10 mg total) by mouth daily before breakfast. 30 tablet 2   diltiazem (CARDIZEM CD) 120 MG 24 hr capsule Take 1 capsule (120 mg total) by mouth 2 (two) times daily. 180 capsule 3   flecainide (TAMBOCOR) 100 MG tablet Take 1 tablet (100 mg total) by mouth 2 (two) times daily. 180 tablet 2   HYDROcodone-acetaminophen (NORCO/VICODIN) 5-325 MG tablet Take 1 tablet by mouth every 4 (four) hours as needed.     lidocaine (LIDODERM) 5 % Place 1 patch onto the skin daily. Remove & Discard patch within 12 hours or as directed by MD 30 patch 0   naproxen (NAPROSYN) 500 MG tablet Take 1 tablet (500 mg total) by mouth 2 (two) times daily. 30 tablet 0   sildenafil (VIAGRA) 50 MG tablet Take 50 mg (1 tablet total) 1/2 hour to 1 hour prior to intercourse as needed. Limit use to 1/2 tablet or 1 tablet per 24 hours. (Patient not taking: Reported on 06/09/2022) 30 tablet 2   sitaGLIPtin (JANUVIA) 25 MG tablet Take 1 tablet (25 mg total) by mouth daily. 30 tablet 2   No current facility-administered medications on file prior to visit.    Allergies  Allergen Reactions   Metformin And Related Other (See Comments)    Pt endorses blurred vision with metformin use.     Social History   Socioeconomic History   Marital status: Married    Spouse name: Not on file  Number of children: Not on file   Years of education: Not on file   Highest education level: Not on file  Occupational History   Not on file  Tobacco Use   Smoking status: Former    Types: Cigarettes    Passive exposure: Past   Smokeless tobacco: Never   Tobacco comments:    Former Smoker Quit April 2013 08/12/21  Vaping Use   Vaping Use: Never used  Substance and Sexual Activity   Alcohol use: Not Currently   Drug use: Not Currently   Sexual activity: Not on file  Other Topics Concern   Not on file  Social History Narrative   Not on file   Social Determinants of Health   Financial Resource  Strain: Not on file  Food Insecurity: Not on file  Transportation Needs: Not on file  Physical Activity: Not on file  Stress: Not on file  Social Connections: Not on file  Intimate Partner Violence: Not on file    Family History  Problem Relation Age of Onset   Heart disease Neg Hx     Past Surgical History:  Procedure Laterality Date   CARDIOVERSION N/A 10/29/2020   Procedure: CARDIOVERSION;  Surgeon: Nahser, Deloris Ping, MD;  Location: [facility];  Service: Cardiovascular;  Laterality: N/A;    ROS: Review of Systems Negative except as stated above  PHYSICAL EXAM: BP 119/65 (BP Location: Left Arm, Patient Position: Sitting, Cuff Size: Large)   Pulse 70   Temp 98.3 F (36.8 C)   Resp 16   Ht 5\' 10"  (1.778 m)   Wt 238 lb (108 kg)   SpO2 94%   BMI 34.15 kg/m   Physical Exam HENT:     Head: Normocephalic and atraumatic.  Eyes:     Extraocular Movements: Extraocular movements intact.     Conjunctiva/sclera: Conjunctivae normal.     Pupils: Pupils are equal, round, and reactive to light.  Cardiovascular:     Rate and Rhythm: Normal rate and regular rhythm.     Pulses: Normal pulses.     Heart sounds: Normal heart sounds.  Pulmonary:     Effort: Pulmonary effort is normal.     Breath sounds: Normal breath sounds.  Musculoskeletal:     Cervical back: Normal range of motion and neck supple.  Neurological:     General: No focal deficit present.     Mental Status: He is alert and oriented to person, place, and time.  Psychiatric:        Mood and Affect: Mood normal.        Behavior: Behavior normal.     ASSESSMENT AND PLAN: 1. Encounter to discuss test results 2. Red blood cell abnormality - Discussed with patient in detail that he has chronically elevated red blood cells which needs to be further evaluated and managed.  - Confirmed with patient that he should keep his appointment on 08/04/2022 with 08/06/2022, MD at Hematology/Oncology. Patient verbalized  understanding.   3. Prostate cancer screening - Routine screening.  - PSA    Patient was given the opportunity to ask questions.  Patient verbalized understanding of the plan and was able to repeat key elements of the plan. Patient was given clear instructions to go to Emergency Department or return to medical center if symptoms don't improve, worsen, or new problems develop.The patient verbalized understanding.   Orders Placed This Encounter  Procedures   PSA   Follow-up with primary provider as scheduled.   Frankie Zito J  Minette Brine, NP

## 2022-07-19 NOTE — Progress Notes (Signed)
Pt presents to speak to provider about abnormal labs -needs to know about

## 2022-07-19 NOTE — Patient Instructions (Signed)
Red Blood Cell Count Test Why am I having this test? A red blood cell (RBC) count test is done to see how many RBCs you have. RBCs are made in your bone marrow. They circulate in your bloodstream for about 120 days, until they are destroyed by your spleen. These cells are important because they contain hemoglobin, which carries oxygen to body tissues. Having an abnormally low number of RBCs causes anemia. You may have this test as a part of a complete blood count (CBC) test that counts RBCs, white blood cells, and platelets. It may also be done if your health care provider suspects that you have certain medical conditions. These may include abnormally low production of red blood cells or abnormally fast destruction of red blood cells (hemolysis). What is being tested? This test measures the total number of circulating RBCs in a portion of your blood. What kind of sample is taken?  A blood sample is required for this test. It is usually collected by inserting a needle into a blood vessel or by sticking a finger with a small needle. Tell a health care provider about: All medicines you are taking, including vitamins, herbs, eye drops, creams, and over-the-counter medicines. Whether you are pregnant or may be pregnant. How are the results reported? Your test results will be reported as a value that indicates the number of RBCs. The International System of Units (SI units) is used to measure RBCs. Your health care provider will compare your results to normal ranges that were established after testing a large group of people (reference ranges). Reference ranges may vary among labs and hospitals. For this test, common reference ranges are: Adult or elderly: Male: 4.7-6.1. Male: 4.2-5.4. Children: Newborn: 3.9-6.0. 2-8 weeks: 4.0-6.0. 2-6 months: 3.5-5.5. 6 months-1 year: 3.5-5.2. 1-18 years: 4.0-5.5. What do the results mean? Results that are within the reference ranges are considered normal. Test  results that are higher than the reference ranges can indicate a number of health conditions. These may include: Excess RBCs (erythrocytosis). Heart disease that is present at birth (congenital). Severe chronic obstructive pulmonary disease (COPD). A bone marrow disorder that causes overproduction of RBCs (polycythemia vera). Test results that are lower than the reference ranges can indicate: Anemia. Disorders that result from abnormal hemoglobin (hemoglobinopathy). Excessive bleeding (hemorrhage). Bone marrow failure. Presence of an artificial (prosthetic) heart valve. Mechanical valves may damage the RBCs, causing them to decrease in number. Vascular diseases such as rheumatoid arthritis, systemic lupus erythematosus, or sarcoidosis. Cancers such as lymphoma, leukemia, Hodgkin's disease, or multiple myeloma. Talk with your health care provider about what your results mean. Questions to ask your health care provider Ask your health care provider, or the department that is doing the test: When will my results be ready? How will I get my results? What are my treatment options? What other tests do I need? What are my next steps? Summary A red blood cell (RBC) count is a test to see how many RBCs you have. RBCs are made in your bone marrow, and they circulate in your bloodstream for about 120 days. They are important because they contain hemoglobin, which carries oxygen to your body tissues. A variety of health conditions can lead to low or high RBC counts. Talk with your health care provider about what your test results mean. This information is not intended to replace advice given to you by your health care provider. Make sure you discuss any questions you have with your health care provider. Document Revised: 09/14/2021  Document Reviewed: 09/14/2021 Elsevier Patient Education  [place].

## 2022-07-20 LAB — PSA: Prostate Specific Ag, Serum: 1.1 ng/mL (ref 0.0–4.0)

## 2022-07-26 ENCOUNTER — Telehealth: Payer: Self-pay | Admitting: *Deleted

## 2022-07-26 NOTE — Telephone Encounter (Signed)
-----  Message from Lauralee Evener, [place] sent at 07/17/2022  9:24 AM EST -----  ----- Message ----- From: Sueanne Margarita, MD Sent: 07/14/2022   6:01 PM EST To: [facility] Sleep Studies  Please let patient know that they had a successful PAP titration and let DME know that orders are in EPIC.  Please set up 6 week OV with me.

## 2022-07-26 NOTE — Telephone Encounter (Signed)
The patient has been notified of the result and verbalized understanding.  All questions (if any) were answered. [name] [name], [place] 07/26/2022 6:20 PM    Upon patient request DME selection is [facility] Patient understands he will be contacted by [facility] to set up his cpap. Patient understands to call if [facility] does not contact him with new setup in a timely manner. Patient understands they will be called once confirmation has been received from [facility] that they have received their new machine to schedule 10 week follow up appointment.   [facility] notified of new cpap order  Please add to airview Patient was grateful for the call and thanked me.

## 2022-08-03 ENCOUNTER — Telehealth (HOSPITAL_COMMUNITY): Payer: Self-pay

## 2022-08-04 ENCOUNTER — Inpatient Hospital Stay: Payer: BC Managed Care – PPO | Attending: Oncology | Admitting: Oncology

## 2022-08-04 ENCOUNTER — Other Ambulatory Visit: Payer: Self-pay

## 2022-08-04 ENCOUNTER — Ambulatory Visit
Admission: RE | Admit: 2022-08-04 | Discharge: 2022-08-04 | Disposition: A | Discharge status: Home / Self Care | Payer: BC Managed Care – PPO | Source: Ambulatory Visit | Attending: Family | Admitting: Family

## 2022-08-04 ENCOUNTER — Other Ambulatory Visit: Payer: Self-pay | Admitting: Family

## 2022-08-04 ENCOUNTER — Encounter: Payer: Self-pay | Admitting: Oncology

## 2022-08-04 ENCOUNTER — Inpatient Hospital Stay: Payer: BC Managed Care – PPO

## 2022-08-04 VITALS — BP 136/66 | HR 102 | Temp 98.8°F | Resp 17 | Ht 70.0 in | Wt 242.2 lb

## 2022-08-04 DIAGNOSIS — E119 Type 2 diabetes mellitus without complications: Secondary | ICD-10-CM | POA: Insufficient documentation

## 2022-08-04 DIAGNOSIS — Z79899 Other long term (current) drug therapy: Secondary | ICD-10-CM | POA: Insufficient documentation

## 2022-08-04 DIAGNOSIS — N5082 Scrotal pain: Secondary | ICD-10-CM

## 2022-08-04 DIAGNOSIS — N503 Cyst of epididymis: Secondary | ICD-10-CM | POA: Diagnosis not present

## 2022-08-04 DIAGNOSIS — D573 Sickle-cell trait: Secondary | ICD-10-CM

## 2022-08-04 DIAGNOSIS — R718 Other abnormality of red blood cells: Secondary | ICD-10-CM

## 2022-08-04 DIAGNOSIS — E785 Hyperlipidemia, unspecified: Secondary | ICD-10-CM | POA: Diagnosis not present

## 2022-08-04 DIAGNOSIS — D751 Secondary polycythemia: Secondary | ICD-10-CM | POA: Diagnosis not present

## 2022-08-04 DIAGNOSIS — G4733 Obstructive sleep apnea (adult) (pediatric): Secondary | ICD-10-CM | POA: Diagnosis not present

## 2022-08-04 DIAGNOSIS — Z8249 Family history of ischemic heart disease and other diseases of the circulatory system: Secondary | ICD-10-CM | POA: Diagnosis not present

## 2022-08-04 LAB — CBC WITH DIFFERENTIAL (CANCER CENTER ONLY)
Abs Immature Granulocytes: 0.03 10*3/uL (ref 0.00–0.07)
Basophils Absolute: 0 10*3/uL (ref 0.0–0.1)
Basophils Relative: 1 %
Eosinophils Absolute: 0.2 10*3/uL (ref 0.0–0.5)
Eosinophils Relative: 3 %
HCT: 45.1 % (ref 39.0–52.0)
Hemoglobin: 14.8 g/dL (ref 13.0–17.0)
Immature Granulocytes: 1 %
Lymphocytes Relative: 13 %
Lymphs Abs: 0.8 10*3/uL (ref 0.7–4.0)
MCH: 21.8 pg — ABNORMAL LOW (ref 26.0–34.0)
MCHC: 32.8 g/dL (ref 30.0–36.0)
MCV: 66.5 fL — ABNORMAL LOW (ref 80.0–100.0)
Monocytes Absolute: 0.6 10*3/uL (ref 0.1–1.0)
Monocytes Relative: 10 %
Neutro Abs: 4.3 10*3/uL (ref 1.7–7.7)
Neutrophils Relative %: 72 %
Platelet Count: 237 10*3/uL (ref 150–400)
RBC: 6.78 MIL/uL — ABNORMAL HIGH (ref 4.22–5.81)
RDW: 18.2 % — ABNORMAL HIGH (ref 11.5–15.5)
WBC Count: 6 10*3/uL (ref 4.0–10.5)
nRBC: 0 % (ref 0.0–0.2)

## 2022-08-04 LAB — TECHNOLOGIST SMEAR REVIEW: Plt Morphology: NORMAL

## 2022-08-04 LAB — FOLATE: Folate: 8.7 ng/mL (ref >5.9)

## 2022-08-04 LAB — RETICULOCYTES
Immature Retic Fract: 18.2 % — ABNORMAL HIGH (ref 2.3–15.9)
RBC.: 6.84 MIL/uL — ABNORMAL HIGH (ref 4.22–5.81)
Retic Count, Absolute: 91.7 10*3/uL (ref 19.0–186.0)
Retic Ct Pct: 1.3 % (ref 0.4–3.1)

## 2022-08-04 LAB — VITAMIN B12: Vitamin B-12: 194 pg/mL (ref 180–914)

## 2022-08-04 LAB — FERRITIN: Ferritin: 27 ng/mL (ref 24–336)

## 2022-08-04 NOTE — Progress Notes (Signed)
[facility] Initial Visit:  Patient Care Team: Camillia Herter, NP as PCP - General (Nurse Practitioner)  CHIEF COMPLAINTS/PURPOSE OF CONSULTATION:  HISTORY OF PRESENTING ILLNESS: [name] [name] 57 y.o. male is here because of  polycythemia Medical history notable for acute renal failure, atrial flutter, congestive heart failure, leukocytosis, polysubstance abuse, diabetes mellitus type 2, hyperlipidemia  August 07, 2021: WBC 5.7 hemoglobin 14.6 MCV 69 platelet count 265  July 13, 2022: Sleep study demonstrated severe obstructive sleep apnea.  Moderate oxygen titration to 79%.  Trial of CPAP recommended  August 04 2022:  [facility] Hematology Consult Has not yet gotten CPAP machine.  Patient states that he has sickle cell trait.  Sister had sickle cell disease and died from complications of this Patient was actually diagnosed with OSA in 2021 but insurance would not cover the CPAP machine.   Has no history of VOC or painful crisis  Had a colonoscopy in 2015 which he states was negative for polyps  Social:  Married.  Drives a forklift.  No tobacco.  EtOH none   Review of Systems  Constitutional:  Negative for appetite change, chills, fatigue, fever and unexpected weight change.  HENT:   Negative for nosebleeds, sore throat, tinnitus, trouble swallowing and voice change.   Eyes:  Negative for eye problems and icterus.       Vision changes:  None  Respiratory:  Negative for chest tightness, cough, hemoptysis and wheezing.        DOE walking up stairs  Cardiovascular:  Negative for chest pain, leg swelling and palpitations.       PND:  none Orthopnea:  none  Gastrointestinal:  Negative for abdominal pain, constipation, diarrhea, nausea, rectal pain and vomiting.       Occasional hematochezia  Endocrine: Negative for hot flashes.       Cold intolerance:  none Heat intolerance:  none  Genitourinary:  Negative for bladder incontinence, difficulty urinating,  dysuria, frequency, hematuria and nocturia.   Musculoskeletal:  Positive for arthralgias and myalgias. Negative for back pain, gait problem, neck pain and neck stiffness.  Skin:  Negative for itching, rash and wound.  Neurological:  Negative for dizziness, extremity weakness, gait problem, light-headedness, numbness, seizures and speech difficulty.       Has a HA since yesterday morning  Hematological:  Negative for adenopathy. Does not bruise/bleed easily.  Psychiatric/Behavioral:  Negative for suicidal ideas. The patient is not nervous/anxious.     MEDICAL HISTORY: Past Medical History:  Diagnosis Date   ARF (acute renal failure) (HCC)    Atrial flutter (HCC)    Community acquired bacterial pneumonia    Diastolic CHF, chronic (HCC)    Leukocytosis    N&V (nausea and vomiting)    SOB (shortness of breath)    Substance abuse ([place])    Prior hx of cocaine (quit 5 years ago)/EtOH (clean for 14 months) as of 10/2011    SURGICAL HISTORY: Past Surgical History:  Procedure Laterality Date   CARDIOVERSION N/A 10/29/2020   Procedure: CARDIOVERSION;  Surgeon: Nahser, Wonda Cheng, MD;  Location: [facility] ENDOSCOPY;  Service: Cardiovascular;  Laterality: N/A;    SOCIAL HISTORY: Social History   Socioeconomic History   Marital status: Married    Spouse name: Not on file   Number of children: Not on file   Years of education: Not on file   Highest education level: Not on file  Occupational History   Not on file  Tobacco Use  Smoking status: Former    Types: Cigarettes    Passive exposure: Past   Smokeless tobacco: Never   Tobacco comments:    Former Smoker Quit April 2013 08/12/21  Vaping Use   Vaping Use: Never used  Substance and Sexual Activity   Alcohol use: Not Currently   Drug use: Not Currently   Sexual activity: Not on file  Other Topics Concern   Not on file  Social History Narrative   Not on file   Social Determinants of Health   Financial Resource Strain: Medium Risk  (08/14/2022)   Overall Financial Resource Strain (CARDIA)    Difficulty of Paying Living Expenses: Somewhat hard  Food Insecurity: No Food Insecurity (08/04/2022)   Hunger Vital Sign    Worried About Running Out of Food in the Last Year: Never true    Ran Out of Food in the Last Year: Never true  Transportation Needs: No Transportation Needs (08/04/2022)   PRAPARE - Hydrologist (Medical): No    Lack of Transportation (Non-Medical): No  Physical Activity: Not on file  Stress: Not on file  Social Connections: Not on file  Intimate Partner Violence: Not At Risk (08/04/2022)   Humiliation, Afraid, Rape, and Kick questionnaire    Fear of Current or Ex-Partner: No    Emotionally Abused: No    Physically Abused: No    Sexually Abused: No    FAMILY HISTORY Family History  Problem Relation Age of Onset   Heart disease Neg Hx     ALLERGIES:  is allergic to metformin and related.  MEDICATIONS:  Current Outpatient Medications  Medication Sig Dispense Refill   acetaminophen (TYLENOL) 500 MG tablet Take 1,000 mg by mouth as needed for mild pain, moderate pain or headache.     apixaban (ELIQUIS) 5 MG TABS tablet Take 1 tablet (5 mg total) by mouth 2 (two) times daily. 180 tablet 3   atorvastatin (LIPITOR) 40 MG tablet TAKE 1 TABLET BY MOUTH DAILY 90 tablet 3   dapagliflozin propanediol (FARXIGA) 10 MG TABS tablet Take 1 tablet (10 mg total) by mouth daily before breakfast. 30 tablet 2   diltiazem (CARDIZEM CD) 120 MG 24 hr capsule Take 1 capsule (120 mg total) by mouth 2 (two) times daily. 180 capsule 3   flecainide (TAMBOCOR) 100 MG tablet Take 1 tablet (100 mg total) by mouth 2 (two) times daily. 180 tablet 2   naproxen (NAPROSYN) 500 MG tablet Take 1 tablet (500 mg total) by mouth 2 (two) times daily. 30 tablet 0   sitaGLIPtin (JANUVIA) 25 MG tablet Take 1 tablet (25 mg total) by mouth daily. 30 tablet 2   No current facility-administered medications for this  visit.    PHYSICAL EXAMINATION:  ECOG PERFORMANCE STATUS: 1 - Symptomatic but completely ambulatory   Vitals:   08/04/22 1307  BP: 136/66  Pulse: (!) 102  Resp: 17  Temp: 98.8 F (37.1 C)  SpO2: 96%    Filed Weights   08/04/22 1307  Weight: 242 lb 3.2 oz (109.9 kg)     Physical Exam Vitals and nursing note reviewed.  Constitutional:      General: He is not in acute distress.    Appearance: Normal appearance. He is normal weight. He is not ill-appearing or diaphoretic.  HENT:     Head: Normocephalic and atraumatic.     Right Ear: External ear normal.     Left Ear: External ear normal.     Nose:  Nose normal.  Eyes:     General: No scleral icterus.    Conjunctiva/sclera: Conjunctivae normal.     Pupils: Pupils are equal, round, and reactive to light.  Cardiovascular:     Rate and Rhythm: Normal rate and regular rhythm.     Heart sounds: Normal heart sounds. No murmur heard.    No friction rub. No gallop.  Pulmonary:     Effort: Pulmonary effort is normal. No respiratory distress.     Breath sounds: Normal breath sounds. No stridor. No wheezing, rhonchi or rales.  Abdominal:     General: Bowel sounds are normal. There is no distension.     Palpations: Abdomen is soft. There is no mass.     Tenderness: There is no abdominal tenderness. There is no guarding or rebound.     Hernia: No hernia is present.  Musculoskeletal:        General: No swelling, tenderness, deformity or signs of injury. Normal range of motion.     Cervical back: Normal range of motion and neck supple. No rigidity or tenderness.  Lymphadenopathy:     Head:     Right side of head: No submental, submandibular, tonsillar, preauricular, posterior auricular or occipital adenopathy.     Left side of head: No submental, submandibular, tonsillar, preauricular, posterior auricular or occipital adenopathy.     Cervical: No cervical adenopathy.     Right cervical: No superficial, deep or posterior cervical  adenopathy.    Left cervical: No superficial, deep or posterior cervical adenopathy.     Upper Body:     Right upper body: No supraclavicular or axillary adenopathy.     Left upper body: No supraclavicular or axillary adenopathy.     Lower Body: No right inguinal adenopathy. No left inguinal adenopathy.  Skin:    Coloration: Skin is not jaundiced.  Neurological:     General: No focal deficit present.     Mental Status: He is alert and oriented to person, place, and time.     Cranial Nerves: No cranial nerve deficit.  Psychiatric:        Mood and Affect: Mood normal.        Behavior: Behavior normal.        Thought Content: Thought content normal.        Judgment: Judgment normal.      LABORATORY DATA: I have personally reviewed the data as listed:  Clinical Support on 08/04/2022  Component Date Value Ref Range Status   WBC MORPHOLOGY 08/04/2022 MORPHOLOGY UNREMARKABLE   Final   RBC MORPHOLOGY 08/04/2022 POLYCHROMASIA PRESENT   Final   Comment: TARGET CELLS OVALOCYTES    Plt Morphology 08/04/2022 Normal platelet morphology   Final   Clinical Information 08/04/2022 micorcytosis   Final   Performed at [facility], [place] [address] [place], [place] [address]   Hgb F 08/04/2022 0.0  0.0 - 2.0 % Final   Hgb A 08/04/2022 68.6 (L)  96.4 - 98.8 % Final   Hgb A2 08/04/2022 3.0  1.8 - 3.2 % Final   Hgb S 08/04/2022 28.4 (H)  0.0 % Final   Interpretation, Hgb Fract 08/04/2022 Comment   Final   Comment: (NOTE) Reflex to Hgb Solubility indicated for confirmation. Performed At: [facility] [place], [place] [id number] Rush Farmer MD LZ:[id number]    Retic Ct Pct 08/04/2022 1.3  0.4 - 3.1 % Final   RBC. 08/04/2022 6.84 (H)  4.22 - 5.81 MIL/uL Final  Retic Count, Absolute 08/04/2022 91.7  19.0 - 186.0 K/uL Final   Immature Retic Fract 08/04/2022 18.2 (H)  2.3 - 15.9 % Final   Performed at [facility], [address], [place], [place] [address]   Vitamin B-12 08/04/2022 194  180 - 914 pg/mL Final   Comment: (NOTE) This assay is not validated for testing neonatal or myeloproliferative syndrome specimens for Vitamin B12 levels. Performed at [facility], [address] [place], [place] [address]    Haptoglobin 08/04/2022 173  29 - 370 mg/dL Final   Comment: (NOTE) Performed At: [facility] [address] [place], [place] [id number] Jolene Schimke MD BO:[id number]    Folate 08/04/2022 8.7  >5.9 ng/mL Final   Performed at [facility], [address], [place], [place] [address]   Ferritin 08/04/2022 27  24 - 336 ng/mL Final   Performed at [facility], [address], [place], [place] [address]   WBC Count 08/04/2022 6.0  4.0 - 10.5 K/uL Final   RBC 08/04/2022 6.78 (H)  4.22 - 5.81 MIL/uL Final   Hemoglobin 08/04/2022 14.8  13.0 - 17.0 g/dL Final   HCT 14/43/1540 45.1  39.0 - 52.0 % Final   MCV 08/04/2022 66.5 (L)  80.0 - 100.0 fL Final   MCH 08/04/2022 21.8 (L)  26.0 - 34.0 pg Final   MCHC 08/04/2022 32.8  30.0 - 36.0 g/dL Final   RDW 08/67/6195 18.2 (H)  11.5 - 15.5 % Final   Platelet Count 08/04/2022 237  150 - 400 K/uL Final   nRBC 08/04/2022 0.0  0.0 - 0.2 % Final   Neutrophils Relative % 08/04/2022 72  % Final   Neutro Abs 08/04/2022 4.3  1.7 - 7.7 K/uL Final   Lymphocytes Relative 08/04/2022 13  % Final   Lymphs Abs 08/04/2022 0.8  0.7 - 4.0 K/uL Final   Monocytes Relative 08/04/2022 10  % Final   Monocytes Absolute 08/04/2022 0.6  0.1 - 1.0 K/uL Final   Eosinophils Relative 08/04/2022 3  % Final   Eosinophils Absolute 08/04/2022 0.2  0.0 - 0.5 K/uL Final   Basophils Relative 08/04/2022 1  % Final   Basophils Absolute 08/04/2022 0.0  0.0 - 0.1 K/uL Final   Immature Granulocytes 08/04/2022 1  % Final   Abs Immature Granulocytes 08/04/2022 0.03  0.00 - 0.07 K/uL Final   Performed at [facility], [address], [place], [place] [address]   Hgb Solubility 08/04/2022 Positive (A)  Negative Final   Final Interpretation: 08/04/2022 Comment   Final   Comment: (NOTE) Hemoglobin pattern and concentrations are consistent with sickle cell trait (heterozygous). Hgb S is observed at a concentration less than the interpretation range which suggests: transfusion of a homozygous sickle cell patient, sickle trait with alpha-thalassemia, or sickle trait with iron deficiency anemia. Suggest clinical and hematologic correlation.         Sickle Trait Interpretation Ranges         Hgb A       50.0 - 70.0%         Hgb S       30.0 - 45.0%         Hgb A2       1.8 -  4.0% Performed At: [facility] [address] [place], [place] [id number] Jolene Schimke MD IP:[id number]   Office Visit on 07/19/2022  Component Date Value Ref Range Status  Prostate Specific Ag, Serum 07/19/2022 1.1  0.0 - 4.0 ng/mL Final   Comment: Roche ECLIA methodology. According to the American Urological Association, Serum PSA should decrease and remain at undetectable levels after radical prostatectomy. The AUA defines biochemical recurrence as an initial PSA value 0.2 ng/mL or greater followed by a subsequent confirmatory PSA value 0.2 ng/mL or greater. Values obtained with different assay methods or kits cannot be used interchangeably. Results cannot be interpreted as absolute evidence of the presence or absence of malignant disease.   Office Visit on 07/07/2022  Component Date Value Ref Range Status   Creatinine, Urine 07/07/2022 106.7  Not Estab. mg/dL Final   Microalbumin, Urine 07/07/2022 30.7  Not Estab. ug/mL Final   Microalb/Creat Ratio 07/07/2022 29  0 - 29 mg/g creat Final   Comment:                        Normal:                0 -  29                        Moderately increased: 30 - 300                        Severely increased:       >300    Glucose 07/07/2022 95  70 - 99 mg/dL  Final   BUN 07/07/2022 7  6 - 24 mg/dL Final   Creatinine, Ser 07/07/2022 1.10  0.76 - 1.27 mg/dL Final   eGFR 07/07/2022 80  >59 mL/min/1.73 Final   BUN/Creatinine Ratio 07/07/2022 6 (L)  9 - 20 Final   Sodium 07/07/2022 140  134 - 144 mmol/L Final   Potassium 07/07/2022 4.9  3.5 - 5.2 mmol/L Final   Chloride 07/07/2022 103  96 - 106 mmol/L Final   CO2 07/07/2022 20  20 - 29 mmol/L Final   Calcium 07/07/2022 9.3  8.7 - 10.2 mg/dL Final   Total Protein 07/07/2022 7.3  6.0 - 8.5 g/dL Final   Albumin 07/07/2022 4.4  3.8 - 4.9 g/dL Final   Globulin, Total 07/07/2022 2.9  1.5 - 4.5 g/dL Final   Albumin/Globulin Ratio 07/07/2022 1.5  1.2 - 2.2 Final   Bilirubin Total 07/07/2022 0.3  0.0 - 1.2 mg/dL Final   Alkaline Phosphatase 07/07/2022 120  44 - 121 IU/L Final   AST 07/07/2022 20  0 - 40 IU/L Final   ALT 07/07/2022 29  0 - 44 IU/L Final   WBC 07/07/2022 5.7  3.4 - 10.8 x10E3/uL Final   RBC 07/07/2022 6.88 (H)  4.14 - 5.80 x10E6/uL Final   Hemoglobin 07/07/2022 14.6  13.0 - 17.7 g/dL Final   Hematocrit 07/07/2022 47.6  37.5 - 51.0 % Final   MCV 07/07/2022 69 (L)  79 - 97 fL Final   MCH 07/07/2022 21.2 (L)  26.6 - 33.0 pg Final   MCHC 07/07/2022 30.7 (L)  31.5 - 35.7 g/dL Final   RDW 07/07/2022 18.8 (H)  11.6 - 15.4 % Final   Platelets 07/07/2022 265  150 - 450 x10E3/uL Final   RPR Ser Ql 07/07/2022 Non Reactive  Non Reactive Final   Neisseria Gonorrhea 07/07/2022 Negative   Final   Chlamydia 07/07/2022 Negative   Final   Trichomonas 07/07/2022 Negative   Final   Comment 07/07/2022 Normal Reference Range Trichomonas - Negative   Final  Comment 07/07/2022 Normal Reference [place] Chlamydia - Negative   Final   Comment 07/07/2022 Normal Reference Range Neisseria Gonorrhea - Negative   Final   HSV 1 Glycoprotein G Ab, IgG 07/07/2022 >62.20 (H)  0.00 - 0.90 index Final   Comment:                                  Negative        <0.91                                  Equivocal 0.91 - 1.09                                   Positive        >1.09  Note: Negative indicates no antibodies detected to  HSV-1. Equivocal may suggest early infection.  If  clinically appropriate, retest at later date. Positive  indicates antibodies detected to HSV-1.    HSV 2 IgG, Type Spec 07/07/2022 <0.91  0.00 - 0.90 index Final   Comment:                                  Negative        <0.91                                  Equivocal 0.91 - 1.09                                  Positive        >1.09  HSV-2 Antibody Interpretation: Current guidelines and  recommendations do not recommend routine screening  for HSV-2 in asymptomatic individuals, including  those that are pregnant. A negative antibody result  indicates no detectable antibodies to HSV-2 were  found. If recent exposure is suspected, retest in 4  to 6 weeks. Equivocal samples should be retested in  4 to 6 weeks. A positive result indicates the  presence of detectable IgG antibody to HSV-2.  FALSE POSITIVE RESULTS MAY OCCUR. Repeat testing, or  testing by a different method, may be indicated in  some settings (e.g. patients with low likelihood of  HSV infection). If clinically appropriate, retest 4  to 6 weeks later. HSV-2 IgG antibody testing results  should be clinically correlated.     RADIOGRAPHIC STUDIES: I have personally reviewed the radiological images as listed and agree with the findings in the report  No results found.  ASSESSMENT/PLAN   57 y.o. male is here because of  polycythemia.  Medical history notable for acute renal failure, atrial flutter, congestive heart failure, leukocytosis, polysubstance abuse, diabetes mellitus type 2, hyperlipidemia  Polycythemia:  Patient has risk factors for development of secondary polycythemia:  OSA as yet untreated, CHF.  At this time will hold on additional evaluation and await institution of CPAP with plans to have him follow up.  If he remains polycythemic then can evaluate  further.  No need for therapeutic phlebotomy at this time  Possible sickle cell trait:  Obtain Hgb electrophoresis  History of colon polyps:  Can  be a potential source of blood loss.  Obtain Ferritin and CBC with diff    Cancer Staging  No matching staging information was found for the patient.   No problem-specific Assessment & Plan notes found for this encounter.   Orders Placed This Encounter  Procedures   CBC with Differential (Cancer Center Only)    Standing Status:   Future    Number of Occurrences:   1    Standing Expiration Date:   08/05/2023   Ferritin    Standing Status:   Future    Number of Occurrences:   1    Standing Expiration Date:   08/05/2023   Folate    Standing Status:   Future    Number of Occurrences:   1    Standing Expiration Date:   08/05/2023   Haptoglobin    Standing Status:   Future    Number of Occurrences:   1    Standing Expiration Date:   08/05/2023   Vitamin B12    Standing Status:   Future    Number of Occurrences:   1    Standing Expiration Date:   08/05/2023   Reticulocytes    Standing Status:   Future    Number of Occurrences:   1    Standing Expiration Date:   08/05/2023   Hgb Fractionation Cascade    Standing Status:   Future    Number of Occurrences:   1    Standing Expiration Date:   08/05/2023   Technologist smear review    Standing Status:   Future    Number of Occurrences:   1    Standing Expiration Date:   08/05/2023    Order Specific Question:   Clinical information:    Answer:   micorcytosis    46  minutes was spent in patient care.  This included time spent preparing to see the patient (e.g., review of tests), obtaining and/or reviewing separately obtained history, counseling and educating the patient, ordering tests, documenting clinical information in the electronic or other health record, independently interpreting results and communicating results to the patient as well as coordination of care.       All questions were  answered. The patient knows to call the clinic with any problems, questions or concerns.  This note was electronically signed.    Barbee Cough, MD  08/15/2022 12:49 PM

## 2022-08-06 LAB — HAPTOGLOBIN: Haptoglobin: 173 mg/dL (ref 29–370)

## 2022-08-08 LAB — HGB SOLUBILITY: Hgb Solubility: POSITIVE — AB

## 2022-08-08 LAB — HGB FRACTIONATION CASCADE
Hgb A2: 3 % (ref 1.8–3.2)
Hgb A: 68.6 % — ABNORMAL LOW (ref 96.4–98.8)
Hgb F: 0 % (ref 0.0–2.0)
Hgb S: 28.4 % — ABNORMAL HIGH

## 2022-08-14 ENCOUNTER — Telehealth (HOSPITAL_COMMUNITY): Payer: Self-pay | Admitting: Licensed Clinical Social Worker

## 2022-08-14 NOTE — Telephone Encounter (Signed)
H&V Care Navigation CSW Progress Note  Clinical Social Worker received call from pt to request help with paying for CPAP- got my number from his MD.    Pt reports copay is $418 then $15/month after for 9 months- getting it through [facility].  CSW spoke with rep who reports that no financial assistance available but they could allow the patient to pay $209 up front then $40/month after for 9 months.  Pt reports he could manage that monthly copay but would be unable to pay for $209 up front without a struggle.  CSW able to assist with this payment through [facility] program.  Pt planning to call back agency today and plan to set to pick up cpap Friday.   SDOH Screenings   Food Insecurity: No Food Insecurity (08/04/2022)  Housing: Low Risk  (08/04/2022)  Transportation Needs: No Transportation Needs (08/04/2022)  Utilities: Not At Risk (08/04/2022)  Depression (PHQ2-9): Low Risk  (06/16/2022)  Financial Resource Strain: Medium Risk (08/14/2022)  Tobacco Use: Medium Risk (08/04/2022)   [name] Ny, LCSW Clinical Social Worker [facility] Desk#: [phone] Cell#: [phone]

## 2022-08-15 DIAGNOSIS — D573 Sickle-cell trait: Secondary | ICD-10-CM | POA: Insufficient documentation

## 2022-08-15 DIAGNOSIS — D751 Secondary polycythemia: Secondary | ICD-10-CM | POA: Insufficient documentation

## 2022-08-16 ENCOUNTER — Encounter: Payer: Self-pay | Admitting: Gastroenterology

## 2022-08-18 DIAGNOSIS — Z8709 Personal history of other diseases of the respiratory system: Secondary | ICD-10-CM | POA: Diagnosis not present

## 2022-08-18 DIAGNOSIS — K2100 Gastro-esophageal reflux disease with esophagitis, without bleeding: Secondary | ICD-10-CM | POA: Diagnosis not present

## 2022-08-25 ENCOUNTER — Ambulatory Visit (HOSPITAL_COMMUNITY)
Admission: RE | Admit: 2022-08-25 | Discharge: 2022-08-25 | Disposition: A | Discharge status: Home / Self Care | Payer: BC Managed Care – PPO | Source: Ambulatory Visit | Attending: Physician Assistant | Admitting: Physician Assistant

## 2022-08-25 VITALS — BP 142/64 | HR 73 | Ht 70.0 in | Wt 238.2 lb

## 2022-08-25 DIAGNOSIS — Z8616 Personal history of COVID-19: Secondary | ICD-10-CM | POA: Diagnosis not present

## 2022-08-25 DIAGNOSIS — Z7984 Long term (current) use of oral hypoglycemic drugs: Secondary | ICD-10-CM | POA: Diagnosis not present

## 2022-08-25 DIAGNOSIS — Z7901 Long term (current) use of anticoagulants: Secondary | ICD-10-CM | POA: Diagnosis not present

## 2022-08-25 DIAGNOSIS — Z87891 Personal history of nicotine dependence: Secondary | ICD-10-CM | POA: Diagnosis not present

## 2022-08-25 DIAGNOSIS — G4733 Obstructive sleep apnea (adult) (pediatric): Secondary | ICD-10-CM | POA: Insufficient documentation

## 2022-08-25 DIAGNOSIS — Z79899 Other long term (current) drug therapy: Secondary | ICD-10-CM | POA: Insufficient documentation

## 2022-08-25 DIAGNOSIS — I5030 Unspecified diastolic (congestive) heart failure: Secondary | ICD-10-CM | POA: Insufficient documentation

## 2022-08-25 DIAGNOSIS — E119 Type 2 diabetes mellitus without complications: Secondary | ICD-10-CM | POA: Diagnosis not present

## 2022-08-25 DIAGNOSIS — E669 Obesity, unspecified: Secondary | ICD-10-CM | POA: Diagnosis not present

## 2022-08-25 DIAGNOSIS — I480 Paroxysmal atrial fibrillation: Secondary | ICD-10-CM

## 2022-08-25 DIAGNOSIS — Z6834 Body mass index (BMI) 34.0-34.9, adult: Secondary | ICD-10-CM | POA: Diagnosis not present

## 2022-08-25 MED ORDER — DILTIAZEM HCL ER COATED BEADS 120 MG PO CP24: 120.0000 mg | ORAL_CAPSULE | Freq: Two times a day (BID) | ORAL | 3 refills | Status: DC

## 2022-08-25 MED ORDER — APIXABAN 5 MG PO TABS: 5.0000 mg | ORAL_TABLET | Freq: Two times a day (BID) | ORAL | 3 refills | Status: DC

## 2022-08-25 NOTE — Progress Notes (Signed)
Primary Care Physician: Camillia Herter, NP Primary Cardiologist: Dr Meda Coffee (remotely) Primary Electrophysiologist: none Referring Physician: Ian Malkin ER   [name] [name] is a 57 y.o. male with a history of paroxysmal afib, atrial flutter, diastolic CHF, OSA, DM, and tobacco use. He was initially diagnosed with afib in 2013 in the setting of CAP. He spontaneously converted back to SR. Unfortunately, patient tested positive for COVID-19 and was found to be in atrial flutter and later in afib at the ER. He converted to SR after diltiazem drip. He was asymptomatic during the episode. He denies any alcohol use.   Patient has had multiple ED visits for various reasons. He presented to the ED on 10/07/20 for cough and sore throat and was incidentally found to be in rapid atrial flutter. He underwent DCCV at that time. He does admit to taking psuedophedrine before the onset of his symptoms. He was back in rapid flutter on follow up and started on flecainide. Patient is s/p DCCV 10/29/20.   On follow up today, patient reports that he has done well since his last visit. He denies any heart racing or palpitations. He is going later today to get his CPAP.   Today, he denies symptoms of palpitations, chest pain, shortness of breath, orthopnea, PND, lower extremity edema, dizziness, or neurologic sequela. The patient is tolerating medications without difficulties and is otherwise without complaint today.    Atrial Fibrillation Risk Factors:  he does have symptoms or diagnosis of sleep apnea. He is starting CPAP today. he does have a history of alcohol use. Does not drink currently.   he has a BMI of Body mass index is 34.18 kg/m.Marland Kitchen Filed Weights   08/25/22 1020  Weight: 108 kg    Family History  Problem Relation Age of Onset   Heart disease Neg Hx      Atrial Fibrillation Management history:  Previous antiarrhythmic drugs: flecainide  Previous cardioversions: 10/29/20 Previous  ablations: none CHADS2VASC score: 1 Anticoagulation history: Eliquis   Past Medical History:  Diagnosis Date   ARF (acute renal failure) (HCC)    Atrial flutter (HCC)    Community acquired bacterial pneumonia    Diastolic CHF, chronic (HCC)    Leukocytosis    N&V (nausea and vomiting)    SOB (shortness of breath)    Substance abuse ([place])    Prior hx of cocaine (quit 5 years ago)/EtOH (clean for 14 months) as of 10/2011   Past Surgical History:  Procedure Laterality Date   CARDIOVERSION N/A 10/29/2020   Procedure: CARDIOVERSION;  Surgeon: Thayer Headings, MD;  Location: [facility];  Service: Cardiovascular;  Laterality: N/A;    Current Outpatient Medications  Medication Sig Dispense Refill   acetaminophen (TYLENOL) 500 MG tablet Take 1,000 mg by mouth as needed for mild pain, moderate pain or headache.     apixaban (ELIQUIS) 5 MG TABS tablet Take 1 tablet (5 mg total) by mouth 2 (two) times daily. 180 tablet 3   atorvastatin (LIPITOR) 40 MG tablet TAKE 1 TABLET BY MOUTH DAILY 90 tablet 3   dapagliflozin propanediol (FARXIGA) 10 MG TABS tablet Take 1 tablet (10 mg total) by mouth daily before breakfast. 30 tablet 2   diltiazem (CARDIZEM CD) 120 MG 24 hr capsule Take 1 capsule (120 mg total) by mouth 2 (two) times daily. 180 capsule 3   flecainide (TAMBOCOR) 100 MG tablet Take 1 tablet (100 mg total) by mouth 2 (two) times daily. 180 tablet 2   sitaGLIPtin (  JANUVIA) 25 MG tablet Take 1 tablet (25 mg total) by mouth daily. 30 tablet 2   No current facility-administered medications for this encounter.    Allergies  Allergen Reactions   Metformin And Related Other (See Comments)    Pt endorses blurred vision with metformin use.     Social History   Socioeconomic History   Marital status: Married    Spouse name: Not on file   Number of children: Not on file   Years of education: Not on file   Highest education level: Not on file  Occupational History   Not on file   Tobacco Use   Smoking status: Former    Types: Cigarettes    Passive exposure: Past   Smokeless tobacco: Never   Tobacco comments:    Former Smoker Quit April 2013 08/12/21  Vaping Use   Vaping Use: Never used  Substance and Sexual Activity   Alcohol use: Not Currently   Drug use: Not Currently   Sexual activity: Not on file  Other Topics Concern   Not on file  Social History Narrative   Not on file   Social Determinants of Health   Financial Resource Strain: Medium Risk (08/14/2022)   Overall Financial Resource Strain (CARDIA)    Difficulty of Paying Living Expenses: Somewhat hard  Food Insecurity: No Food Insecurity (08/04/2022)   Hunger Vital Sign    Worried About Running Out of Food in the Last Year: Never true    [place] in the Last Year: Never true  Transportation Needs: No Transportation Needs (08/04/2022)   PRAPARE - Hydrologist (Medical): No    Lack of Transportation (Non-Medical): No  Physical Activity: Not on file  Stress: Not on file  Social Connections: Not on file  Intimate Partner Violence: Not At Risk (08/04/2022)   Humiliation, Afraid, Rape, and Kick questionnaire    Fear of Current or Ex-Partner: No    Emotionally Abused: No    Physically Abused: No    Sexually Abused: No     ROS- All systems are reviewed and negative except as per the HPI above.  Physical Exam: Vitals:   08/25/22 1020  BP: (!) 142/64  Pulse: 73  Weight: 108 kg  Height: 5' 10"$  (1.778 m)    GEN- The patient is a well appearing obese male, alert and oriented x 3 today.   HEENT-head normocephalic, atraumatic, sclera clear, conjunctiva pink, hearing intact, trachea midline. Lungs- Clear to ausculation bilaterally, normal work of breathing Heart- Regular rate and rhythm, no murmurs, rubs or gallops  GI- soft, NT, ND, + BS Extremities- no clubbing, cyanosis, or edema MS- no significant deformity or atrophy Skin- no rash or lesion Psych-  euthymic mood, full affect Neuro- strength and sensation are intact   Wt Readings from Last 3 Encounters:  08/25/22 108 kg  08/04/22 109.9 kg  07/19/22 108 kg    EKG today demonstrates  SR Vent. rate 73 BPM PR interval 144 ms QRS duration 94 ms QT/QTcB 364/401 ms  Echo 11/19/20 demonstrated   1. Left ventricular ejection fraction, by estimation, is 55 to 60%. The  left ventricle has normal function. The left ventricle demonstrates global hypokinesis. Left ventricular diastolic parameters were normal.   2. Right ventricular systolic function is normal. The right ventricular  size is normal. Tricuspid regurgitation signal is inadequate for assessing PA pressure.   3. The mitral valve is grossly normal. Trivial mitral valve  regurgitation.  4. The aortic valve is tricuspid. Aortic valve regurgitation is not  visualized.   5. The inferior vena cava is dilated in size with >50% respiratory  variability, suggesting right atrial pressure of 8 mmHg.   Comparison(s): Prior images unable to be directly viewed, comparison made by report only. No significant change from prior study. 10/23/2011: LVEF 55-60%.    Epic records are reviewed at length today  CHA2DS2-VASc Score = 1  The patient's score is based upon: CHF History: 0 HTN History: 0 Diabetes History: 1 Stroke History: 0 Vascular Disease History: 0 Age Score: 0 Gender Score: 0       ASSESSMENT AND PLAN: 1. Paroxysmal Atrial Fibrillation/typical atrial flutter The patient's CHA2DS2-VASc score is 1, indicating a 0.6% annual risk of stroke.   Patient appears to be maintaining SR. Continue flecainide 100 mg BID Continue diltiazem 120 mg BID Continue Eliquis 5 mg BID  2. Obesity Body mass index is 34.18 kg/m. Lifestyle modification was discussed and encouraged including regular physical activity and weight reduction.  3. Severe OSA He is going to pick up his CPAP today, appreciate help from CSW. Followed by Dr  Radford Pax.   Follow up in the AF clinic in 6 months.    [facility] [address] [place], [place] [address] [phone] 08/25/2022 10:50 AM

## 2022-09-01 DIAGNOSIS — H43823 Vitreomacular adhesion, bilateral: Secondary | ICD-10-CM | POA: Diagnosis not present

## 2022-09-01 DIAGNOSIS — E119 Type 2 diabetes mellitus without complications: Secondary | ICD-10-CM | POA: Diagnosis not present

## 2022-09-01 LAB — HM DIABETES EYE EXAM

## 2022-09-15 ENCOUNTER — Other Ambulatory Visit: Payer: Self-pay | Admitting: Family

## 2022-09-15 DIAGNOSIS — E119 Type 2 diabetes mellitus without complications: Secondary | ICD-10-CM

## 2022-09-18 NOTE — Telephone Encounter (Signed)
Requested medication (s) are due for refill today: no  Requested medication (s) are on the active medication list: no  Last refill:  06/16/22 #30 2 RF  Future visit scheduled: yes  Notes to clinic:  please review- is on current med list - was going to refuse but want to make sure pt no lner on it or needs to be reordered Overdue lab work  Requested Prescriptions  Pending Prescriptions Disp Refills   FARXIGA 10 MG TABS tablet [Pharmacy Med Name: Farxiga 10 MG Oral Tablet] 90 tablet 3    Sig: TAKE 1 [place]     Endocrinology:  Diabetes - SGLT2 Inhibitors Passed - 09/15/2022 10:42 PM      Passed - Cr in normal range and within 360 days    Creatinine, Ser  Date Value Ref Range Status  07/07/2022 1.10 0.76 - 1.27 mg/dL Final   Creatinine, Urine  Date Value Ref Range Status  10/21/2011 261.0 mg/dL Final         Passed - HBA1C is between 0 and 7.9 and within 180 days    Hemoglobin A1C  Date Value Ref Range Status  06/16/2022 6.8 (A) 4.0 - 5.6 % Final   HbA1c, POC (controlled diabetic range)  Date Value Ref Range Status  05/06/2021 6.7 0.0 - 7.0 % Final   Hgb A1c MFr Bld  Date Value Ref Range Status  03/17/2022 6.9 (H) 4.8 - 5.6 % Final    Comment:             Prediabetes: 5.7 - 6.4          Diabetes: >6.4          Glycemic control for adults with diabetes: <7.0          Passed - eGFR in normal range and within 360 days    GFR calc Af Amer  Date Value Ref Range Status  01/14/2019 >60 >60 mL/min Final   GFR, Estimated  Date Value Ref Range Status  10/18/2020 >60 >60 mL/min Final    Comment:    (NOTE) Calculated using the CKD-EPI Creatinine Equation (2021)    eGFR  Date Value Ref Range Status  07/07/2022 80 >59 mL/min/1.73 Final         Passed - Valid encounter within last 6 months    Recent Outpatient Visits           2 months ago Encounter to discuss test results   [place] [facility], [place], NP    2 months ago Annual physical exam   [place] [facility] Amy J, NP   3 months ago Type 2 diabetes mellitus without complication, without long-term current use of insulin ([place])   [place] [facility], Amy J, NP   6 months ago Type 2 diabetes mellitus without complication, without long-term current use of insulin ([place])   [place] [facility], Amy J, NP   9 months ago Type 2 diabetes mellitus without complication, without long-term current use of insulin [facility])    Primary Care at [facility], [place], NP       Future Appointments             In 3 weeks Camillia Herter, NP [facility]   In 2 months Turner, Eber Hong, MD [place] at  [address] [employer]

## 2022-09-22 ENCOUNTER — Ambulatory Visit: Payer: BC Managed Care – PPO | Admitting: Family

## 2022-09-22 ENCOUNTER — Other Ambulatory Visit: Payer: Self-pay

## 2022-09-22 ENCOUNTER — Telehealth: Payer: Self-pay | Admitting: Family

## 2022-09-22 ENCOUNTER — Ambulatory Visit: Payer: BC Managed Care – PPO | Admitting: Gastroenterology

## 2022-09-22 DIAGNOSIS — B009 Herpesviral infection, unspecified: Secondary | ICD-10-CM

## 2022-09-22 MED ORDER — VALACYCLOVIR HCL 1 G PO TABS: 1000.0000 mg | ORAL_TABLET | Freq: Two times a day (BID) | ORAL | 0 refills | Status: AC

## 2022-09-22 NOTE — Progress Notes (Signed)
Pt request Valacyclovir refill. Completed

## 2022-09-22 NOTE — Telephone Encounter (Signed)
Pt came in requesting a refill be sent in for his [place] on [place]

## 2022-09-22 NOTE — Telephone Encounter (Signed)
Request completed.

## 2022-09-23 DIAGNOSIS — G4733 Obstructive sleep apnea (adult) (pediatric): Secondary | ICD-10-CM | POA: Diagnosis not present

## 2022-10-01 ENCOUNTER — Other Ambulatory Visit: Payer: Self-pay | Admitting: Family

## 2022-10-01 DIAGNOSIS — E119 Type 2 diabetes mellitus without complications: Secondary | ICD-10-CM

## 2022-10-04 ENCOUNTER — Encounter: Payer: Self-pay | Admitting: Cardiology

## 2022-10-04 NOTE — Progress Notes (Signed)
[facility] Cancer Initial Visit:  Patient Care Team: Rema Fendt, NP as PCP - General (Nurse Practitioner)  CHIEF COMPLAINTS/PURPOSE OF CONSULTATION:  HISTORY OF PRESENTING ILLNESS: [name] [name] 57 y.o. male is here because of  polycythemia Medical history notable for acute renal failure, atrial flutter, congestive heart failure, leukocytosis, polysubstance abuse, diabetes mellitus type 2, hyperlipidemia  August 07, 2021: WBC 5.7 hemoglobin 14.6 MCV 69 platelet count 265  July 13, 2022: Sleep study demonstrated severe obstructive sleep apnea.  Moderate oxygen titration to 79%.  Trial of CPAP recommended  August 04 2022:  [facility] Hematology Consult Has not yet gotten CPAP machine.  Patient states that he has sickle cell trait.  Sister had sickle cell disease and died from complications of this Patient was actually diagnosed with OSA in 2021 but insurance would not cover the CPAP machine.   Has no history of VOC or painful crisis  Had a colonoscopy in 2015 which he states was negative for polyps  Social:  Married.  Drives a forklift.  No tobacco.  EtOH none  WBC 6.0 RBC 6.78 MCV 67 platelet count 237; 72 segs 13 lymphs 10 monos 3 eos 1 basophil.  Reticulocyte count 1.3% Hemoglobin electrophoresis hemoglobin A 68.6% hemoglobin A 2 3% hemoglobin S 28.4 Ferritin 27 folate 8.7 B12 194  October 06, 2022: Scheduled follow-up for management of polycythemia.  Reviewed results of labs with patient.  Since last visit has started on CPAP and using it regularly.  Last colonoscopy was 2015; no polyps per patient.  No hematochezia, melena currently.  Six months ago had a bout of hematochezia which has resolved. Has a GI consult on Nov 17 2022 for consideration of endoscopy  Hgb 14.5  today.    Erythrocyte response to hypoxemia likely blunted by sickle cell trait   Review of Systems  Constitutional:  Negative for appetite change, chills, fatigue, fever and unexpected  weight change.  HENT:   Negative for nosebleeds, sore throat, tinnitus, trouble swallowing and voice change.   Eyes:  Negative for eye problems and icterus.       Vision changes:  None  Respiratory:  Negative for chest tightness, cough, hemoptysis and wheezing.        DOE walking up stairs  Cardiovascular:  Negative for chest pain, leg swelling and palpitations.       PND:  none Orthopnea:  none  Gastrointestinal:  Negative for abdominal pain, constipation, diarrhea, nausea, rectal pain and vomiting.       Occasional hematochezia  Endocrine: Negative for hot flashes.       Cold intolerance:  none Heat intolerance:  none  Genitourinary:  Negative for bladder incontinence, difficulty urinating, dysuria, frequency, hematuria and nocturia.   Musculoskeletal:  Positive for arthralgias and myalgias. Negative for back pain, gait problem, neck pain and neck stiffness.  Skin:  Negative for itching, rash and wound.  Neurological:  Negative for dizziness, extremity weakness, gait problem, light-headedness, numbness, seizures and speech difficulty.       Has a HA since yesterday morning  Hematological:  Negative for adenopathy. Does not bruise/bleed easily.  Psychiatric/Behavioral:  Negative for suicidal ideas. The patient is not nervous/anxious.     MEDICAL HISTORY: Past Medical History:  Diagnosis Date   ARF (acute renal failure) (HCC)    Atrial flutter (HCC)    Community acquired bacterial pneumonia    Diastolic CHF, chronic (HCC)    Leukocytosis    N&V (nausea and vomiting)  SOB (shortness of breath)    Substance abuse (HCC)    Prior hx of cocaine (quit 5 years ago)/EtOH (clean for 14 months) as of 10/2011    SURGICAL HISTORY: Past Surgical History:  Procedure Laterality Date   CARDIOVERSION N/A 10/29/2020   Procedure: CARDIOVERSION;  Surgeon: Nahser, Deloris Ping, MD;  Location: [facility] ENDOSCOPY;  Service: Cardiovascular;  Laterality: N/A;    SOCIAL HISTORY: Social History    Socioeconomic History   Marital status: Married    Spouse name: Not on file   Number of children: Not on file   Years of education: Not on file   Highest education level: Not on file  Occupational History   Not on file  Tobacco Use   Smoking status: Former    Types: Cigarettes    Passive exposure: Past   Smokeless tobacco: Never   Tobacco comments:    Former Smoker Quit April 2013 08/12/21  Vaping Use   Vaping Use: Never used  Substance and Sexual Activity   Alcohol use: Not Currently   Drug use: Not Currently   Sexual activity: Not on file  Other Topics Concern   Not on file  Social History Narrative   Not on file   Social Determinants of Health   Financial Resource Strain: Medium Risk (08/14/2022)   Overall Financial Resource Strain (CARDIA)    Difficulty of Paying Living Expenses: Somewhat hard  Food Insecurity: No Food Insecurity (08/04/2022)   Hunger Vital Sign    Worried About Running Out of Food in the Last Year: Never true    Ran Out of Food in the Last Year: Never true  Transportation Needs: No Transportation Needs (08/04/2022)   PRAPARE - Administrator, Civil Service (Medical): No    Lack of Transportation (Non-Medical): No  Physical Activity: Not on file  Stress: Not on file  Social Connections: Not on file  Intimate Partner Violence: Not At Risk (08/04/2022)   Humiliation, Afraid, Rape, and Kick questionnaire    Fear of Current or Ex-Partner: No    Emotionally Abused: No    Physically Abused: No    Sexually Abused: No    FAMILY HISTORY Family History  Problem Relation Age of Onset   Heart disease Neg Hx     ALLERGIES:  is allergic to metformin and related.  MEDICATIONS:  Current Outpatient Medications  Medication Sig Dispense Refill   acetaminophen (TYLENOL) 500 MG tablet Take 1,000 mg by mouth as needed for mild pain, moderate pain or headache.     apixaban (ELIQUIS) 5 MG TABS tablet Take 1 tablet (5 mg total) by mouth 2 (two)  times daily. 180 tablet 3   atorvastatin (LIPITOR) 40 MG tablet TAKE 1 TABLET BY MOUTH DAILY 90 tablet 3   diltiazem (CARDIZEM CD) 120 MG 24 hr capsule Take 1 capsule (120 mg total) by mouth 2 (two) times daily. 180 capsule 3   FARXIGA 10 MG TABS tablet TAKE 1 TABLET BY MOUTH DAILY  BEFORE BREAKFAST 90 tablet 3   flecainide (TAMBOCOR) 100 MG tablet Take 1 tablet (100 mg total) by mouth 2 (two) times daily. 180 tablet 2   JANUVIA 25 MG tablet TAKE 1 TABLET BY MOUTH DAILY 90 tablet 3   No current facility-administered medications for this visit.    PHYSICAL EXAMINATION:  ECOG PERFORMANCE STATUS: 1 - Symptomatic but completely ambulatory   There were no vitals filed for this visit.   There were no vitals filed for this visit.  Physical Exam Vitals and nursing note reviewed.  Constitutional:      General: He is not in acute distress.    Appearance: Normal appearance. He is normal weight. He is not ill-appearing or diaphoretic.  HENT:     Head: Normocephalic and atraumatic.     Right Ear: External ear normal.     Left Ear: External ear normal.     Nose: Nose normal.  Eyes:     General: No scleral icterus.    Conjunctiva/sclera: Conjunctivae normal.     Pupils: Pupils are equal, round, and reactive to light.  Cardiovascular:     Rate and Rhythm: Normal rate and regular rhythm.     Heart sounds: Normal heart sounds. No murmur heard.    No friction rub. No gallop.  Pulmonary:     Effort: Pulmonary effort is normal. No respiratory distress.     Breath sounds: Normal breath sounds. No stridor. No wheezing, rhonchi or rales.  Abdominal:     General: Bowel sounds are normal. There is no distension.     Palpations: Abdomen is soft. There is no mass.     Tenderness: There is no abdominal tenderness. There is no guarding or rebound.     Hernia: No hernia is present.  Musculoskeletal:        General: No swelling, tenderness, deformity or signs of injury. Normal range of motion.      Cervical back: Normal range of motion and neck supple. No rigidity or tenderness.  Lymphadenopathy:     Head:     Right side of head: No submental, submandibular, tonsillar, preauricular, posterior auricular or occipital adenopathy.     Left side of head: No submental, submandibular, tonsillar, preauricular, posterior auricular or occipital adenopathy.     Cervical: No cervical adenopathy.     Right cervical: No superficial, deep or posterior cervical adenopathy.    Left cervical: No superficial, deep or posterior cervical adenopathy.     Upper Body:     Right upper body: No supraclavicular or axillary adenopathy.     Left upper body: No supraclavicular or axillary adenopathy.     Lower Body: No right inguinal adenopathy. No left inguinal adenopathy.  Skin:    Coloration: Skin is not jaundiced.  Neurological:     General: No focal deficit present.     Mental Status: He is alert and oriented to person, place, and time.     Cranial Nerves: No cranial nerve deficit.  Psychiatric:        Mood and Affect: Mood normal.        Behavior: Behavior normal.        Thought Content: Thought content normal.        Judgment: Judgment normal.      LABORATORY DATA: I have personally reviewed the data as listed:  No visits with results within 1 Month(s) from this visit.  Latest known visit with results is:  Clinical Support on 08/04/2022  Component Date Value Ref Range Status   WBC MORPHOLOGY 08/04/2022 MORPHOLOGY UNREMARKABLE   Final   RBC MORPHOLOGY 08/04/2022 POLYCHROMASIA PRESENT   Final   Comment: TARGET CELLS OVALOCYTES    Plt Morphology 08/04/2022 Normal platelet morphology   Final   Clinical Information 08/04/2022 micorcytosis   Final   Performed at [facility], [address] [place], [place] [address]   Hgb F 08/04/2022 0.0  0.0 - 2.0 % Final   Hgb A 08/04/2022 68.6 (L)  96.4 -  98.8 % Final   Hgb A2 08/04/2022 3.0  1.8 - 3.2 % Final   Hgb S 08/04/2022  28.4 (H)  0.0 % Final   Interpretation, Hgb Fract 08/04/2022 Comment   Final   Comment: (NOTE) Reflex to Hgb Solubility indicated for confirmation. Performed At: [facility] [address] [place], [place] [id number] Jolene Schimke MD OZ:[id number]    Retic Ct Pct 08/04/2022 1.3  0.4 - 3.1 % Final   RBC. 08/04/2022 6.84 (H)  4.22 - 5.81 MIL/uL Final   Retic Count, Absolute 08/04/2022 91.7  19.0 - 186.0 K/uL Final   Immature Retic Fract 08/04/2022 18.2 (H)  2.3 - 15.9 % Final   Performed at [facility], [address], [place], [place] [address]   Vitamin B-12 08/04/2022 194  180 - 914 pg/mL Final   Comment: (NOTE) This assay is not validated for testing neonatal or myeloproliferative syndrome specimens for Vitamin B12 levels. Performed at [facility], [address], [place], [place] [address]    Haptoglobin 08/04/2022 173  29 - 370 mg/dL Final   Comment: (NOTE) Performed At: [facility] [address] [place], [place] [id number] Jolene Schimke MD DG:[id number]    Folate 08/04/2022 8.7  >5.9 ng/mL Final   Performed at [facility], [address], [place], [place] [address]   Ferritin 08/04/2022 27  24 - 336 ng/mL Final   Performed at [facility], [address], [place], [place] [address]   WBC Count 08/04/2022 6.0  4.0 - 10.5 K/uL Final   RBC 08/04/2022 6.78 (H)  4.22 - 5.81 MIL/uL Final   Hemoglobin 08/04/2022 14.8  13.0 - 17.0 g/dL Final   HCT 32/95/1884 45.1  39.0 - 52.0 % Final   MCV 08/04/2022 66.5 (L)  80.0 - 100.0 fL Final   MCH 08/04/2022 21.8 (L)  26.0 - 34.0 pg Final   MCHC 08/04/2022 32.8  30.0 - 36.0 g/dL Final   RDW 16/60/6301 18.2 (H)  11.5 - 15.5 % Final   Platelet Count 08/04/2022 237  150 - 400 K/uL Final   nRBC 08/04/2022 0.0  0.0 - 0.2 % Final   Neutrophils Relative % 08/04/2022 72  % Final   Neutro Abs 08/04/2022 4.3  1.7 - 7.7 K/uL Final    Lymphocytes Relative 08/04/2022 13  % Final   Lymphs Abs 08/04/2022 0.8  0.7 - 4.0 K/uL Final   Monocytes Relative 08/04/2022 10  % Final   Monocytes Absolute 08/04/2022 0.6  0.1 - 1.0 K/uL Final   Eosinophils Relative 08/04/2022 3  % Final   Eosinophils Absolute 08/04/2022 0.2  0.0 - 0.5 K/uL Final   Basophils Relative 08/04/2022 1  % Final   Basophils Absolute 08/04/2022 0.0  0.0 - 0.1 K/uL Final   Immature Granulocytes 08/04/2022 1  % Final   Abs Immature Granulocytes 08/04/2022 0.03  0.00 - 0.07 K/uL Final   Performed at [facility], [address], [place], [place] [address]   Hgb Solubility 08/04/2022 Positive (A)  Negative Final   Final Interpretation: 08/04/2022 Comment   Final   Comment: (NOTE) Hemoglobin pattern and concentrations are consistent with sickle cell trait (heterozygous). Hgb S is observed at a concentration less than the interpretation range which suggests: transfusion of a homozygous sickle cell patient, sickle trait with alpha-thalassemia, or sickle trait with iron deficiency anemia. Suggest clinical and hematologic correlation.         Sickle Trait  Interpretation Ranges         Hgb A       50.0 - 70.0%         Hgb S       30.0 - 45.0%         Hgb A2       1.8 -  4.0% Performed At: [facility] [address] [place], [place] [id number] Jolene SchimkeNagendra Sanjai MD WU:[phone]     RADIOGRAPHIC STUDIES: I have personally reviewed the radiological images as listed and agree with the findings in the report  No results found.  ASSESSMENT/PLAN   57 y.o. male is here because of  polycythemia.  Medical history notable for acute renal failure, atrial flutter, congestive heart failure, leukocytosis, polysubstance abuse, diabetes mellitus type 2, hyperlipidemia  Polycythemia:  Patient has risk factors for development of secondary polycythemia:  1) OSA 2) CHF.   October 06 2022- Now on CPAP and using it regularly.  No need for therapeutic  phlebotomy at this time Erythrocyte response blunted by sickle cell trait and iron deficiency  Sickle cell trait:   August 04 2022- Hgb electrophoresis demonstrates sickle cell trait  History of colon polyps:  Can be a potential source of blood loss.   August 04 2022:  Ferritin 27 Nov 17 2022- GI consult for consideration of endoscopy.      Cancer Staging  No matching staging information was found for the patient.   No problem-specific Assessment & Plan notes found for this encounter.   No orders of the defined types were placed in this encounter.   30  minutes was spent in patient care.  This included time spent preparing to see the patient (e.g., review of tests), obtaining and/or reviewing separately obtained history, counseling and educating the patient, ordering tests, documenting clinical information in the electronic or other health record, independently interpreting results and communicating results to the patient as well as coordination of care.       All questions were answered. The patient knows to call the clinic with any problems, questions or concerns.  This note was electronically signed.    Loni MuseEverett C , MD  10/04/2022 5:45 PM

## 2022-10-06 ENCOUNTER — Inpatient Hospital Stay (HOSPITAL_BASED_OUTPATIENT_CLINIC_OR_DEPARTMENT_OTHER): Payer: BC Managed Care – PPO | Admitting: Oncology

## 2022-10-06 ENCOUNTER — Other Ambulatory Visit: Payer: Self-pay

## 2022-10-06 ENCOUNTER — Encounter: Payer: Self-pay | Admitting: Oncology

## 2022-10-06 ENCOUNTER — Inpatient Hospital Stay: Payer: BC Managed Care – PPO | Attending: Oncology

## 2022-10-06 ENCOUNTER — Other Ambulatory Visit: Payer: Self-pay | Admitting: *Deleted

## 2022-10-06 VITALS — BP 143/72 | HR 73 | Temp 98.0°F | Resp 18 | Ht 70.0 in | Wt 243.9 lb

## 2022-10-06 DIAGNOSIS — E785 Hyperlipidemia, unspecified: Secondary | ICD-10-CM | POA: Insufficient documentation

## 2022-10-06 DIAGNOSIS — D573 Sickle-cell trait: Secondary | ICD-10-CM | POA: Diagnosis not present

## 2022-10-06 DIAGNOSIS — G4733 Obstructive sleep apnea (adult) (pediatric): Secondary | ICD-10-CM

## 2022-10-06 DIAGNOSIS — I5032 Chronic diastolic (congestive) heart failure: Secondary | ICD-10-CM

## 2022-10-06 DIAGNOSIS — D751 Secondary polycythemia: Secondary | ICD-10-CM

## 2022-10-06 DIAGNOSIS — Z87891 Personal history of nicotine dependence: Secondary | ICD-10-CM | POA: Insufficient documentation

## 2022-10-06 DIAGNOSIS — I209 Angina pectoris, unspecified: Secondary | ICD-10-CM | POA: Diagnosis not present

## 2022-10-06 DIAGNOSIS — Z7984 Long term (current) use of oral hypoglycemic drugs: Secondary | ICD-10-CM | POA: Diagnosis not present

## 2022-10-06 DIAGNOSIS — D72819 Decreased white blood cell count, unspecified: Secondary | ICD-10-CM | POA: Insufficient documentation

## 2022-10-06 DIAGNOSIS — I4891 Unspecified atrial fibrillation: Secondary | ICD-10-CM | POA: Insufficient documentation

## 2022-10-06 DIAGNOSIS — Z7901 Long term (current) use of anticoagulants: Secondary | ICD-10-CM | POA: Insufficient documentation

## 2022-10-06 DIAGNOSIS — R718 Other abnormality of red blood cells: Secondary | ICD-10-CM

## 2022-10-06 DIAGNOSIS — N189 Chronic kidney disease, unspecified: Secondary | ICD-10-CM | POA: Insufficient documentation

## 2022-10-06 DIAGNOSIS — Z79899 Other long term (current) drug therapy: Secondary | ICD-10-CM | POA: Insufficient documentation

## 2022-10-06 DIAGNOSIS — E119 Type 2 diabetes mellitus without complications: Secondary | ICD-10-CM | POA: Insufficient documentation

## 2022-10-06 LAB — CBC WITH DIFFERENTIAL (CANCER CENTER ONLY)
Abs Immature Granulocytes: 0.02 10*3/uL (ref 0.00–0.07)
Basophils Absolute: 0 10*3/uL (ref 0.0–0.1)
Basophils Relative: 1 %
Eosinophils Absolute: 0.2 10*3/uL (ref 0.0–0.5)
Eosinophils Relative: 3 %
HCT: 45.6 % (ref 39.0–52.0)
Hemoglobin: 14.5 g/dL (ref 13.0–17.0)
Immature Granulocytes: 0 %
Lymphocytes Relative: 36 %
Lymphs Abs: 2 10*3/uL (ref 0.7–4.0)
MCH: 21.5 pg — ABNORMAL LOW (ref 26.0–34.0)
MCHC: 31.8 g/dL (ref 30.0–36.0)
MCV: 67.8 fL — ABNORMAL LOW (ref 80.0–100.0)
Monocytes Absolute: 0.6 10*3/uL (ref 0.1–1.0)
Monocytes Relative: 11 %
Neutro Abs: 2.7 10*3/uL (ref 1.7–7.7)
Neutrophils Relative %: 49 %
Platelet Count: 240 10*3/uL (ref 150–400)
RBC: 6.73 MIL/uL — ABNORMAL HIGH (ref 4.22–5.81)
RDW: 18.1 % — ABNORMAL HIGH (ref 11.5–15.5)
WBC Count: 5.5 10*3/uL (ref 4.0–10.5)
nRBC: 0 % (ref 0.0–0.2)

## 2022-10-06 NOTE — Progress Notes (Signed)
Lab orders placed.  

## 2022-10-09 NOTE — Progress Notes (Signed)
Patient ID: [name] [name], male    DOB: [birth year]  MRN: [id number]  CC: Chronic Care Management   Subjective: [name] [name] is a 57 y.o. male who presents for chronic care management. He is accompanied by his wife via FaceTime.   His concerns today include:  - Doing well on diabetes medication, no issues/concerns. States he does need to decrease his intake of sugar. He is using an over-the-counter supplement to help with diabetes management.   - Needs refills of Valacyclovir.  - No further issues/concerns for discussion today.   Patient Active Problem List   Diagnosis Date Noted   Sickle cell trait 08/15/2022   Polycythemia, secondary 08/15/2022   RBC microcytosis 08/04/2022   OSA (obstructive sleep apnea) 07/13/2022   Herpes 07/08/2022   Hyperlipidemia 11/30/2020   Persistent atrial fibrillation    Paroxysmal atrial fibrillation 05/29/2019   Substance abuse    Diastolic CHF, chronic    Diastolic CHF, chronic 10/24/2011   Atrial flutter 10/22/2011   Community acquired bacterial pneumonia 10/21/2011   SOB (shortness of breath) 10/21/2011   N&V (nausea and vomiting) 10/21/2011   ARF (acute renal failure) 10/21/2011   Tobacco abuse 10/21/2011   Leukocytosis 10/21/2011   Fever 10/21/2011     Current Outpatient Medications on File Prior to Visit  Medication Sig Dispense Refill   acetaminophen (TYLENOL) 500 MG tablet Take 1,000 mg by mouth as needed for mild pain, moderate pain or headache.     apixaban (ELIQUIS) 5 MG TABS tablet Take 1 tablet (5 mg total) by mouth 2 (two) times daily. 180 tablet 3   atorvastatin (LIPITOR) 40 MG tablet TAKE 1 TABLET BY MOUTH DAILY 90 tablet 3   diltiazem (CARDIZEM CD) 120 MG 24 hr capsule Take 1 capsule (120 mg total) by mouth 2 (two) times daily. 180 capsule 3   FARXIGA 10 MG TABS tablet TAKE 1 TABLET BY MOUTH DAILY  BEFORE BREAKFAST 90 tablet 3   flecainide (TAMBOCOR) 100 MG tablet Take 1 tablet (100 mg total) by mouth 2 (two) times  daily. 180 tablet 2   No current facility-administered medications on file prior to visit.    Allergies  Allergen Reactions   Metformin And Related Other (See Comments)    Pt endorses blurred vision with metformin use.     Social History   Socioeconomic History   Marital status: Married    Spouse name: Not on file   Number of children: Not on file   Years of education: Not on file   Highest education level: Not on file  Occupational History   Not on file  Tobacco Use   Smoking status: Former    Types: Cigarettes    Passive exposure: Past   Smokeless tobacco: Never   Tobacco comments:    Former Smoker Quit April 2013 08/12/21  Vaping Use   Vaping Use: Never used  Substance and Sexual Activity   Alcohol use: Not Currently   Drug use: Not Currently   Sexual activity: Not on file  Other Topics Concern   Not on file  Social History Narrative   Not on file   Social Determinants of Health   Financial Resource Strain: Medium Risk (08/14/2022)   Overall Financial Resource Strain (CARDIA)    Difficulty of Paying Living Expenses: Somewhat hard  Food Insecurity: No Food Insecurity (08/04/2022)   Hunger Vital Sign    Worried About Running Out of Food in the Last Year: Never true    Ran Out  of Food in the Last Year: Never true  Transportation Needs: No Transportation Needs (08/04/2022)   PRAPARE - Administrator, Civil Service (Medical): No    Lack of Transportation (Non-Medical): No  Physical Activity: Not on file  Stress: Not on file  Social Connections: Not on file  Intimate Partner Violence: Not At Risk (08/04/2022)   Humiliation, Afraid, Rape, and Kick questionnaire    Fear of Current or Ex-Partner: No    Emotionally Abused: No    Physically Abused: No    Sexually Abused: No    Family History  Problem Relation Age of Onset   Heart disease Neg Hx     Past Surgical History:  Procedure Laterality Date   CARDIOVERSION N/A 10/29/2020   Procedure:  CARDIOVERSION;  Surgeon: Nahser, Deloris Ping, MD;  Location: [facility] ENDOSCOPY;  Service: Cardiovascular;  Laterality: N/A;    ROS: Review of Systems Negative except as stated above  PHYSICAL EXAM: BP 136/78 (BP Location: Right Arm, Patient Position: Sitting, Cuff Size: Large)   Pulse 64   Temp 98.7 F (37.1 C)   Resp 14   Ht 5\' 10"  (1.778 m)   Wt 243 lb 12.8 oz (110.6 kg)   SpO2 98%   BMI 34.98 kg/m   Physical Exam HENT:     Head: Normocephalic and atraumatic.  Eyes:     Extraocular Movements: Extraocular movements intact.     Conjunctiva/sclera: Conjunctivae normal.     Pupils: Pupils are equal, round, and reactive to light.  Cardiovascular:     Rate and Rhythm: Normal rate and regular rhythm.     Pulses: Normal pulses.     Heart sounds: Normal heart sounds.  Pulmonary:     Effort: Pulmonary effort is normal.     Breath sounds: Normal breath sounds.  Musculoskeletal:     Cervical back: Normal range of motion and neck supple.  Neurological:     General: No focal deficit present.     Mental Status: He is alert and oriented to person, place, and time.  Psychiatric:        Mood and Affect: Mood normal.        Behavior: Behavior normal.    ASSESSMENT AND PLAN: 1. Type 2 diabetes mellitus without complication, without long-term current use of insulin - Hemoglobin A1c not at goal at 7.4%, goal 7%. This is increased from previous 6.8%.  - Increase Sitagliptin from 25 mg daily to 50 mg daily.  - Continue Marcelline Deist as prescribed. No refills needed as of present. - Discussed the importance of healthy eating habits, low-carbohydrate diet, low-sugar diet, regular aerobic exercise (at least 150 minutes a week as tolerated) and medication compliance to achieve or maintain control of diabetes. - Follow-up with primary provider in 4 weeks or sooner if needed.  - POCT glycosylated hemoglobin (Hb A1C) - sitaGLIPtin (JANUVIA) 50 MG tablet; Take 1 tablet (50 mg total) by mouth daily.  Dispense:  90 tablet; Refill: 0  2. Herpes - Valacyclovir as prescribed.  - Follow-up with primary provider as scheduled.  - valACYclovir (VALTREX) 1000 MG tablet; Take 1 tablet (1,000 mg total) by mouth 2 (two) times daily.  Dispense: 20 tablet; Refill: 2   Patient was given the opportunity to ask questions.  Patient verbalized understanding of the plan and was able to repeat key elements of the plan. Patient was given clear instructions to go to Emergency Department or return to medical center if symptoms don't improve, worsen, or new problems  develop.The patient verbalized understanding.   Orders Placed This Encounter  Procedures   POCT glycosylated hemoglobin (Hb A1C)     Requested Prescriptions   Signed Prescriptions Disp Refills   valACYclovir (VALTREX) 1000 MG tablet 20 tablet 2    Sig: Take 1 tablet (1,000 mg total) by mouth 2 (two) times daily.   sitaGLIPtin (JANUVIA) 50 MG tablet 90 tablet 0    Sig: Take 1 tablet (50 mg total) by mouth daily.    Return in about 4 weeks (around 11/10/2022) for Follow-Up or next available chronic care mgmt .  Rema Fendt, NP

## 2022-10-13 ENCOUNTER — Ambulatory Visit: Payer: BC Managed Care – PPO | Attending: Cardiology | Admitting: Cardiology

## 2022-10-13 ENCOUNTER — Encounter: Payer: Self-pay | Admitting: Family

## 2022-10-13 ENCOUNTER — Telehealth: Payer: Self-pay | Admitting: *Deleted

## 2022-10-13 ENCOUNTER — Encounter: Payer: Self-pay | Admitting: Cardiology

## 2022-10-13 ENCOUNTER — Ambulatory Visit (INDEPENDENT_AMBULATORY_CARE_PROVIDER_SITE_OTHER): Payer: BC Managed Care – PPO | Admitting: Family

## 2022-10-13 VITALS — Ht 70.0 in | Wt 242.0 lb

## 2022-10-13 VITALS — BP 136/78 | HR 64 | Temp 98.7°F | Resp 14 | Ht 70.0 in | Wt 243.8 lb

## 2022-10-13 DIAGNOSIS — E119 Type 2 diabetes mellitus without complications: Secondary | ICD-10-CM | POA: Diagnosis not present

## 2022-10-13 DIAGNOSIS — B009 Herpesviral infection, unspecified: Secondary | ICD-10-CM

## 2022-10-13 DIAGNOSIS — G4733 Obstructive sleep apnea (adult) (pediatric): Secondary | ICD-10-CM | POA: Diagnosis not present

## 2022-10-13 DIAGNOSIS — I5032 Chronic diastolic (congestive) heart failure: Secondary | ICD-10-CM

## 2022-10-13 DIAGNOSIS — I4819 Other persistent atrial fibrillation: Secondary | ICD-10-CM

## 2022-10-13 LAB — POCT GLYCOSYLATED HEMOGLOBIN (HGB A1C): HbA1c, POC (controlled diabetic range): 7.4 % — AB (ref 0.0–7.0)

## 2022-10-13 MED ORDER — VALACYCLOVIR HCL 1 G PO TABS: 1000.0000 mg | ORAL_TABLET | Freq: Two times a day (BID) | ORAL | 2 refills | Status: DC

## 2022-10-13 MED ORDER — SITAGLIPTIN PHOSPHATE 50 MG PO TABS: 50.0000 mg | ORAL_TABLET | Freq: Every day | ORAL | 0 refills | Status: DC

## 2022-10-13 NOTE — Telephone Encounter (Signed)
Order placed to [facility] via fax.  

## 2022-10-13 NOTE — Progress Notes (Signed)
SLEEP MEDICINE VIRTUAL VISIT      Virtual Visit via Video Note   Because of [name]'s co-morbid illnesses, he is at least at moderate risk for complications without adequate follow up.  This format is felt to be most appropriate for this patient at this time.  All issues noted in this document were discussed and addressed.  A limited physical exam was performed with this format.  Please refer to the patient's chart for his consent to telehealth for [facility].       Date:  10/13/2022   ID:  [name] [name], DOB 03/04/1966, MRN [id number] The patient was identified using 2 identifiers.  Patient Location: Home Provider Location: Home Office   PCP:  Rema FendtStephens, Amy J, NP   [facility] Providers Cardiologist:  None     Evaluation Performed:  Follow-Up Visit  Chief Complaint:  OSA  History of Present Illness:    [name] [name] is a 57 y.o. male with a hx of PAF and atrial flutter followed in A-fib clinic.  He also has a history of severe obstructive sleep but never pursued needed with getting his device after his sleep study.     His initial study was done in January 2021 which demonstrated severe obstructive sleep apnea with an AHI of 33.9/h with no central events.  His RDI was 44.7/h.  AHI during REM sleep was 61.9/h and AHI during supine sleep was 89/h.  He underwent CPAP titration to 12 cm H2O and CPAP.  At the time his CPAP was ordered he never followed through.  He was seen by me in December 2023 and at that time ordered a split-night sleep study.  This was done on 07/13/2002 showing severe obstructive sleep apnea with an AHI of 80.7/h with no significant central events.  He did have nocturnal hypoxemia with lowest O2 saturation 79%.  He was started on auto CPAP from 4 to 15 cm H2O.  He is now here for follow-up.  He is doing well with his PAP device and thinks that he has gotten used to it.  He tolerates the full face  mask and feels the pressure is adequate.   Since going on PAP he feels rested in the am and has no significant daytime sleepiness.  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  He denies any significant mouth or nasal dryness or nasal congestion.  He does not think that he snores.     Past Medical History:  Diagnosis Date   ARF (acute renal failure)    Atrial flutter    Community acquired bacterial pneumonia    Diastolic CHF, chronic    Leukocytosis    N&V (nausea and vomiting)    SOB (shortness of breath)    Substance abuse    Prior hx of cocaine (quit 5 years ago)/EtOH (clean for 14 months) as of 10/2011   Past Surgical History:  Procedure Laterality Date   CARDIOVERSION N/A 10/29/2020   Procedure: CARDIOVERSION;  Surgeon: Vesta MixerNahser, Philip J, MD;  [facility]: [facility];  Service: Cardiovascular;  Laterality: N/A;     Current Meds  Medication Sig   acetaminophen (TYLENOL) 500 MG tablet Take 1,000 mg by mouth as needed for mild pain, moderate pain or headache.   apixaban (ELIQUIS) 5 MG TABS tablet Take 1 tablet (5 mg total) by mouth 2 (two) times daily.   atorvastatin (LIPITOR) 40 MG tablet TAKE 1 TABLET BY MOUTH DAILY   diltiazem (CARDIZEM CD) 120 MG 24  hr capsule Take 1 capsule (120 mg total) by mouth 2 (two) times daily.   FARXIGA 10 MG TABS tablet TAKE 1 TABLET BY MOUTH DAILY  BEFORE BREAKFAST   flecainide (TAMBOCOR) 100 MG tablet Take 1 tablet (100 mg total) by mouth 2 (two) times daily.   JANUVIA 25 MG tablet TAKE 1 TABLET BY MOUTH DAILY     Allergies:   Metformin and related   Social History   Tobacco Use   Smoking status: Former    Types: Cigarettes    Passive exposure: Past   Smokeless tobacco: Never   Tobacco comments:    Former Smoker Quit April 2013 08/12/21  Vaping Use   Vaping Use: Never used  Substance Use Topics   Alcohol use: Not Currently   Drug use: Not Currently     Family Hx: The patient's family history is negative for Heart disease.  ROS:   Please see the history of present  illness.     All other systems reviewed and are negative.   Prior Sleep studies:   The following studies were reviewed today:  Split-night sleep study and PAP compliance download  Labs/Other Tests and Data Reviewed:     Recent Labs: 07/07/2022: ALT 29; BUN 7; Creatinine, Ser 1.10; Potassium 4.9; Sodium 140 10/06/2022: Hemoglobin 14.5; Platelet Count 240   Wt Readings from Last 3 Encounters:  10/13/22 242 lb (109.8 kg)  10/06/22 243 lb 14.4 oz (110.6 kg)  08/25/22 238 lb 3.2 oz (108 kg)     Risk Assessment/Calculations:      Objective:    Vital Signs:  Ht 5\' 10"  (1.778 m)   Wt 242 lb (109.8 kg)   BMI 34.72 kg/m    VITAL SIGNS:  reviewed GEN:  no acute distress EYES:  sclerae anicteric, EOMI - Extraocular Movements Intact RESPIRATORY:  normal respiratory effort, symmetric expansion CARDIOVASCULAR:  no peripheral edema SKIN:  no rash, lesions or ulcers. MUSCULOSKELETAL:  no obvious deformities. NEURO:  alert and oriented x 3, no obvious focal deficit PSYCH:  normal affect  ASSESSMENT & PLAN:    OSA - The patient is tolerating PAP therapy well without any problems. The PAP download performed by his DME was personally reviewed and interpreted by me today and showed an AHI of 9.6/hr on auto CPAP from 4 to 15 cm H2O with 100% compliance in using more than 4 hours nightly.  The patient has been using and benefiting from PAP use and will continue to benefit from therapy.  -his AHI is likely increased due to mask leak and I encouraged him to change out his cushion monthly to prevent leakage -he says that his tubing pulls on his mask when he turns over so I will order a new FFM with tubing coming off the top of his head -I will get an ONO on CPAP to make sure has no residual nocturnal hypoxemia   Total time of encounter: 15 minutes total time ofspent in face-to-face patient care on the date of this encounter. This time includes coordination of care and counseling regarding  above mentioned problem list. Remainder of non-face-to-face time involved reviewing chart documents/testing relevant to the patient encounter and documentation in the medical record. I have independently reviewed documentation from referring provider.    Medication Adjustments/Labs and Tests Ordered: Current medicines are reviewed at length with the patient today.  Concerns regarding medicines are outlined above.   Tests Ordered: No orders of the defined types were placed in this encounter.  Medication Changes: No orders of the defined types were placed in this encounter.   Follow Up:  In Person in 1 year(s)  Signed, Armanda Magicraci Maebel Marasco, MD  10/13/2022 8:31 AM    [facility]

## 2022-10-13 NOTE — Telephone Encounter (Signed)
Per Dr Mayford Knife, please order a full face mask with the tubing coming off the top of his head and get an ONO on CPAP in 6 weeks. Also order supplies

## 2022-10-13 NOTE — Progress Notes (Signed)
Pt is here for chronic care management  Pt states he has a few questions for the provider but declined to share them

## 2022-10-13 NOTE — Patient Instructions (Signed)
Medication Instructions:  Your physician recommends that you continue on your current medications as directed. Please refer to the Current Medication list given to you today.  *If you need a refill on your cardiac medications before your next appointment, please call your pharmacy*  Follow-Up: At [facility], you and your health needs are our priority.  As part of our continuing mission to provide you with exceptional heart care, we have created designated Provider Care Teams.  These Care Teams include your primary Cardiologist (physician) and Advanced Practice Providers (APPs -  Physician Assistants and Nurse Practitioners) who all work together to provide you with the care you need, when you need it.  We recommend signing up for the patient portal called "MyChart".  Sign up information is provided on this After Visit Summary.  MyChart is used to connect with patients for Virtual Visits (Telemedicine).  Patients are able to view lab/test results, encounter notes, upcoming appointments, etc.  Non-urgent messages can be sent to your provider as well.   To learn more about what you can do with MyChart, go to [email or web address].    Your next appointment:   1 year(s)  Provider:   Dr. Turner  

## 2022-10-18 ENCOUNTER — Other Ambulatory Visit: Payer: Self-pay | Admitting: Family

## 2022-10-18 DIAGNOSIS — E785 Hyperlipidemia, unspecified: Secondary | ICD-10-CM

## 2022-10-19 NOTE — Telephone Encounter (Signed)
Complete

## 2022-10-24 DIAGNOSIS — G4733 Obstructive sleep apnea (adult) (pediatric): Secondary | ICD-10-CM | POA: Diagnosis not present

## 2022-10-31 NOTE — Progress Notes (Signed)
Patient ID: [name] [name], male    DOB: January 19, 1966  MRN: [id number]  CC: Chronic Care Management   Subjective: [name] [name] is a 57 y.o. male who presents for chronic care management.  His concerns today include:  - Doing well on diabetes medications, no issues/concerns. He is monitoring what he eats.  - Needs refills on Atorvastatin. - Needs refills on Valtrex. - Reports right posterior upper arm itching. Thinks may have been bit by an insect months ago. Since then area has improved but still has some itching. States he does scratch the area and peel the scab to help. I discussed in detail for patient to trial over-the-counter Cortisone and to follow-up with primary provider as scheduled. Patient verbalized understanding and agreeable.    Patient Active Problem List   Diagnosis Date Noted   Sickle cell trait (HCC) 08/15/2022   Polycythemia, secondary 08/15/2022   RBC microcytosis 08/04/2022   OSA (obstructive sleep apnea) 07/13/2022   Herpes 07/08/2022   Hyperlipidemia 11/30/2020   Persistent atrial fibrillation (HCC)    Paroxysmal atrial fibrillation (HCC) 05/29/2019   Substance abuse (HCC)    Diastolic CHF, chronic    Diastolic CHF, chronic (HCC) 10/24/2011   Atrial flutter (HCC) 10/22/2011   Community acquired bacterial pneumonia 10/21/2011   SOB (shortness of breath) 10/21/2011   N&V (nausea and vomiting) 10/21/2011   ARF (acute renal failure) (HCC) 10/21/2011   Tobacco abuse 10/21/2011   Leukocytosis 10/21/2011   Fever 10/21/2011     Current Outpatient Medications on File Prior to Visit  Medication Sig Dispense Refill   acetaminophen (TYLENOL) 500 MG tablet Take 1,000 mg by mouth as needed for mild pain, moderate pain or headache.     apixaban (ELIQUIS) 5 MG TABS tablet Take 1 tablet (5 mg total) by mouth 2 (two) times daily. 180 tablet 3   diltiazem (CARDIZEM CD) 120 MG 24 hr capsule Take 1 capsule (120 mg total) by mouth 2 (two) times daily. 180 capsule 3    flecainide (TAMBOCOR) 100 MG tablet Take 1 tablet (100 mg total) by mouth 2 (two) times daily. 180 tablet 2   No current facility-administered medications on file prior to visit.    Allergies  Allergen Reactions   Metformin And Related Other (See Comments)    Pt endorses blurred vision with metformin use.     Social History   Socioeconomic History   Marital status: Married    Spouse name: Not on file   Number of children: Not on file   Years of education: Not on file   Highest education level: Not on file  Occupational History   Not on file  Tobacco Use   Smoking status: Former    Types: Cigarettes    Passive exposure: Past   Smokeless tobacco: Never   Tobacco comments:    Former Smoker Quit April 2013 08/12/21  Vaping Use   Vaping Use: Never used  Substance and Sexual Activity   Alcohol use: Not Currently   Drug use: Not Currently   Sexual activity: Not on file  Other Topics Concern   Not on file  Social History Narrative   Not on file   Social Determinants of Health   Financial Resource Strain: Medium Risk (08/14/2022)   Overall Financial Resource Strain (CARDIA)    Difficulty of Paying Living Expenses: Somewhat hard  Food Insecurity: No Food Insecurity (08/04/2022)   Hunger Vital Sign    Worried About Running Out of Food in the Last Year:  Never true    Ran Out of Food in the Last Year: Never true  Transportation Needs: No Transportation Needs (08/04/2022)   PRAPARE - Administrator, Civil Service (Medical): No    Lack of Transportation (Non-Medical): No  Physical Activity: Not on file  Stress: Not on file  Social Connections: Not on file  Intimate Partner Violence: Not At Risk (08/04/2022)   Humiliation, Afraid, Rape, and Kick questionnaire    Fear of Current or Ex-Partner: No    Emotionally Abused: No    Physically Abused: No    Sexually Abused: No    Family History  Problem Relation Age of Onset   Heart disease Neg Hx     Past Surgical  History:  Procedure Laterality Date   CARDIOVERSION N/A 10/29/2020   Procedure: CARDIOVERSION;  Surgeon: Nahser, Deloris Ping, MD;  Location: [facility];  Service: Cardiovascular;  Laterality: N/A;    ROS: Review of Systems Negative except as stated above  PHYSICAL EXAM: BP 128/73   Pulse 68   Resp 16   Wt 237 lb 6.4 oz (107.7 kg)   SpO2 95%   BMI 34.06 kg/m   Physical Exam HENT:     Head: Normocephalic and atraumatic.  Eyes:     Extraocular Movements: Extraocular movements intact.     Conjunctiva/sclera: Conjunctivae normal.     Pupils: Pupils are equal, round, and reactive to light.  Cardiovascular:     Rate and Rhythm: Normal rate and regular rhythm.     Pulses: Normal pulses.     Heart sounds: Normal heart sounds.  Pulmonary:     Effort: Pulmonary effort is normal.     Breath sounds: Normal breath sounds.  Musculoskeletal:     Right shoulder: Normal.     Left shoulder: Normal.     Right upper arm: Normal.     Left upper arm: Normal.     Right elbow: Normal.     Left elbow: Normal.     Right forearm: Normal.     Left forearm: Normal.     Right wrist: Normal.     Left wrist: Normal.     Right hand: Normal.     Left hand: Normal.     Cervical back: Normal range of motion and neck supple.     Comments: Right posterior upper arm with scab. No drainage. No additional presentation.   Neurological:     General: No focal deficit present.     Mental Status: He is alert and oriented to person, place, and time.  Psychiatric:        Mood and Affect: Mood normal.        Behavior: Behavior normal.     ASSESSMENT AND PLAN: 1. Type 2 diabetes mellitus without complication, without long-term current use of insulin (HCC) - Continue Sitagliptin and Dapagliflozin Propanediol as prescribed. - Discussed the importance of healthy eating habits, low-carbohydrate diet, low-sugar diet, regular aerobic exercise (at least 150 minutes a week as tolerated) and medication compliance to  achieve or maintain control of diabetes. - Follow-up with primary provider in 8 weeks or sooner if needed. Will recheck hemoglobin A1c at that time.  - sitaGLIPtin (JANUVIA) 50 MG tablet; Take 1 tablet (50 mg total) by mouth daily.  Dispense: 90 tablet; Refill: 0 - dapagliflozin propanediol (FARXIGA) 10 MG TABS tablet; Take 1 tablet (10 mg total) by mouth daily before breakfast.  Dispense: 90 tablet; Refill: 0  2. Hyperlipidemia, unspecified hyperlipidemia type - Continue  Atorvastatin as prescribed. - Follow-up with primary provider as scheduled.  - atorvastatin (LIPITOR) 40 MG tablet; Take 1 tablet (40 mg total) by mouth daily.  Dispense: 90 tablet; Refill: 0  3. Herpes - Continue Valacyclovir as prescribed.  - Follow-up with primary provider as scheduled.  - valACYclovir (VALTREX) 1000 MG tablet; Take 1 tablet (1,000 mg total) by mouth 2 (two) times daily.  Dispense: 20 tablet; Refill: 2   Patient was given the opportunity to ask questions.  Patient verbalized understanding of the plan and was able to repeat key elements of the plan. Patient was given clear instructions to go to Emergency Department or return to medical center if symptoms don't improve, worsen, or new problems develop.The patient verbalized understanding.   Requested Prescriptions   Signed Prescriptions Disp Refills   valACYclovir (VALTREX) 1000 MG tablet 20 tablet 2    Sig: Take 1 tablet (1,000 mg total) by mouth 2 (two) times daily.   sitaGLIPtin (JANUVIA) 50 MG tablet 90 tablet 0    Sig: Take 1 tablet (50 mg total) by mouth daily.   dapagliflozin propanediol (FARXIGA) 10 MG TABS tablet 90 tablet 0    Sig: Take 1 tablet (10 mg total) by mouth daily before breakfast.   atorvastatin (LIPITOR) 40 MG tablet 90 tablet 0    Sig: Take 1 tablet (40 mg total) by mouth daily.    Return in about 8 weeks (around 01/05/2023) for Follow-Up or next available chronic care mgmt .  [name] Fendt, NP

## 2022-11-06 ENCOUNTER — Encounter: Payer: Self-pay | Admitting: Cardiology

## 2022-11-10 ENCOUNTER — Ambulatory Visit (INDEPENDENT_AMBULATORY_CARE_PROVIDER_SITE_OTHER): Payer: BC Managed Care – PPO | Admitting: Family

## 2022-11-10 ENCOUNTER — Encounter: Payer: Self-pay | Admitting: Family

## 2022-11-10 VITALS — BP 128/73 | HR 68 | Resp 16 | Wt 237.4 lb

## 2022-11-10 DIAGNOSIS — B009 Herpesviral infection, unspecified: Secondary | ICD-10-CM

## 2022-11-10 DIAGNOSIS — E785 Hyperlipidemia, unspecified: Secondary | ICD-10-CM

## 2022-11-10 DIAGNOSIS — E119 Type 2 diabetes mellitus without complications: Secondary | ICD-10-CM | POA: Diagnosis not present

## 2022-11-10 MED ORDER — VALACYCLOVIR HCL 1 G PO TABS: 1000.0000 mg | ORAL_TABLET | Freq: Two times a day (BID) | ORAL | 2 refills | Status: AC

## 2022-11-10 MED ORDER — DAPAGLIFLOZIN PROPANEDIOL 10 MG PO TABS: 10.0000 mg | ORAL_TABLET | Freq: Every day | ORAL | 0 refills | Status: DC

## 2022-11-10 MED ORDER — ATORVASTATIN CALCIUM 40 MG PO TABS: 40.0000 mg | ORAL_TABLET | Freq: Every day | ORAL | 0 refills | Status: DC

## 2022-11-10 MED ORDER — SITAGLIPTIN PHOSPHATE 50 MG PO TABS: 50.0000 mg | ORAL_TABLET | Freq: Every day | ORAL | 0 refills | Status: DC

## 2022-11-17 ENCOUNTER — Encounter: Payer: Self-pay | Admitting: Gastroenterology

## 2022-11-17 ENCOUNTER — Ambulatory Visit (INDEPENDENT_AMBULATORY_CARE_PROVIDER_SITE_OTHER): Payer: BC Managed Care – PPO | Admitting: Gastroenterology

## 2022-11-17 ENCOUNTER — Telehealth: Payer: Self-pay

## 2022-11-17 VITALS — BP 134/76 | HR 81 | Ht 70.0 in | Wt 239.0 lb

## 2022-11-17 DIAGNOSIS — K921 Melena: Secondary | ICD-10-CM | POA: Diagnosis not present

## 2022-11-17 DIAGNOSIS — Z7901 Long term (current) use of anticoagulants: Secondary | ICD-10-CM

## 2022-11-17 DIAGNOSIS — I4819 Other persistent atrial fibrillation: Secondary | ICD-10-CM | POA: Diagnosis not present

## 2022-11-17 DIAGNOSIS — I5032 Chronic diastolic (congestive) heart failure: Secondary | ICD-10-CM | POA: Diagnosis not present

## 2022-11-17 DIAGNOSIS — Z1211 Encounter for screening for malignant neoplasm of colon: Secondary | ICD-10-CM | POA: Diagnosis not present

## 2022-11-17 DIAGNOSIS — Z79899 Other long term (current) drug therapy: Secondary | ICD-10-CM

## 2022-11-17 MED ORDER — CLENPIQ 10-3.5-12 MG-GM -GM/175ML PO SOLN: 1.0000 | Freq: Once | ORAL | 0 refills | Status: AC

## 2022-11-17 NOTE — Telephone Encounter (Signed)
[facility] Pre-operative Risk Assessment     Request for surgical clearance:     Endoscopy Procedure  What type of surgery is being performed?     Colonoscopy  When is this surgery scheduled?     12/29/22  What type of clearance is required ?   Pharmacy  Are there any medications that need to be held prior to surgery and how long? Eliquis for 2 days  Practice name and name of physician performing surgery?      Dr. Barron Alvine, [facility]  What is your office phone and fax number?      Phone- [phone]  Fax- [phone]  Anesthesia type (None, local, MAC, general) ?       MAC

## 2022-11-17 NOTE — Patient Instructions (Addendum)
You have been scheduled for a colonoscopy. Please follow written instructions given to you at your visit today.  Please pick up your prep supplies at the pharmacy within the next 1-3 days. If you use inhalers (even only as needed), please bring them with you on the day of your procedure.   You will be contaced by our office prior to your procedure for directions on holding your Eliquis.  If you do not hear from our office 1 week prior to your scheduled procedure, please call [phone] to discuss.   _______________________________________________________  If your blood pressure at your visit was 140/90 or greater, please contact your primary care physician to follow up on this.  _______________________________________________________ If you are age 85 or younger, your body mass index should be between 19-25. Your Body mass index is 34.29 kg/m. If this is out of the aformentioned range listed, please consider follow up with your Primary Care Provider.   __________________________________________________________  The [facility] providers would like to encourage you to use [facility] to communicate with providers for non-urgent requests or questions.  Due to long hold times on the telephone, sending your provider a message by [facility] may be a faster and more efficient way to get a response.  Please allow 48 business hours for a response.  Please remember that this is for non-urgent requests.   Due to recent changes in healthcare laws, you may see the results of your imaging and laboratory studies on MyChart before your provider has had a chance to review them.  We understand that in some cases there may be results that are confusing or concerning to you. Not all laboratory results come back in the same time frame and the provider may be waiting for multiple results in order to interpret others.  Please give [place] 48 hours in order for your provider to thoroughly review all the results before contacting  the office for clarification of your results.    Thank you for choosing me and [facility].  Vito Cirigliano, D.O.

## 2022-11-17 NOTE — Progress Notes (Signed)
Chief Complaint: Discuss colon cancer screening, perioperative medication management   Referring Provider:    Rema Fendt, NP     HPI:     [name] [name] is a 57 y.o. male with a history of diabetes, HLD, paroxysmal A-fib/atrial flutter s/p cardioversion 2022 (on Eliquis), diastolic CH with EF 55-60%, polysubstance abuse (quit 10+ years ago), polycythemia, OSA (on CPAP), referred to the [facility] for evaluation of   Had a colonoscopy in 2015 at outside facility and normal per patient. No record in EMR for review.   Will occasionally have BRBPR which she has attributed to hemorrhoids. Last episode was last week. Has been off/on for many years. Has had to have I&D about 3 years ago for thrombosed external hemorrhoids.   No known family history of CRC, GI malignancy, liver disease, pancreatic disease, or IBD.    -07/2022: Ferritin 27, folate 8.7, B12 194.  Follows in the Hematology clinic     Latest Ref Rng & Units 10/06/2022    9:41 AM 08/04/2022    1:42 PM 07/07/2022   12:22 PM  CBC  WBC 4.0 - 10.5 K/uL 5.5  6.0  5.7   Hemoglobin 13.0 - 17.0 g/dL 16.1  09.6  04.5   Hematocrit 39.0 - 52.0 % 45.6  45.1  47.6   Platelets 150 - 400 K/uL 240  237  265      Past Medical History:  Diagnosis Date   ARF (acute renal failure) (HCC)    Atrial flutter (HCC)    Community acquired bacterial pneumonia    Diastolic CHF, chronic (HCC)    Leukocytosis    N&V (nausea and vomiting)    SOB (shortness of breath)    Substance abuse (HCC)    Prior hx of cocaine (quit 5 years ago)/EtOH (clean for 14 months) as of 10/2011     Past Surgical History:  Procedure Laterality Date   CARDIOVERSION N/A 10/29/2020   Procedure: CARDIOVERSION;  Surgeon: Vesta Mixer, MD;  Location: [facility];  Service: Cardiovascular;  Laterality: N/A;   Family History  Problem Relation Age of Onset   Diabetes Sister    Heart disease Neg Hx    Colon cancer Neg Hx    Stomach  cancer Neg Hx    Esophageal cancer Neg Hx    Social History   Tobacco Use   Smoking status: Former    Types: Cigarettes    Passive exposure: Past   Smokeless tobacco: Never   Tobacco comments:    Former Smoker Quit April 2013 08/12/21  Vaping Use   Vaping Use: Never used  Substance Use Topics   Alcohol use: Not Currently   Drug use: Not Currently   Current Outpatient Medications  Medication Sig Dispense Refill   apixaban (ELIQUIS) 5 MG TABS tablet Take 1 tablet (5 mg total) by mouth 2 (two) times daily. 180 tablet 3   atorvastatin (LIPITOR) 40 MG tablet Take 1 tablet (40 mg total) by mouth daily. 90 tablet 0   dapagliflozin propanediol (FARXIGA) 10 MG TABS tablet Take 1 tablet (10 mg total) by mouth daily before breakfast. 90 tablet 0   diltiazem (CARDIZEM CD) 120 MG 24 hr capsule Take 1 capsule (120 mg total) by mouth 2 (two) times daily. 180 capsule 3   flecainide (TAMBOCOR) 100 MG tablet Take 1 tablet (100 mg total) by mouth 2 (two) times daily. 180 tablet 2   sitaGLIPtin (  JANUVIA) 50 MG tablet Take 1 tablet (50 mg total) by mouth daily. 90 tablet 0   acetaminophen (TYLENOL) 500 MG tablet Take 1,000 mg by mouth as needed for mild pain, moderate pain or headache. (Patient not taking: Reported on 11/17/2022)     valACYclovir (VALTREX) 1000 MG tablet Take 1 tablet (1,000 mg total) by mouth 2 (two) times daily. (Patient not taking: Reported on 11/17/2022) 20 tablet 2   No current facility-administered medications for this visit.   Allergies  Allergen Reactions   Metformin And Related Other (See Comments)    Pt endorses blurred vision with metformin use.      Review of Systems: All systems reviewed and negative except where noted in HPI.     Physical Exam:    Wt Readings from Last 3 Encounters:  11/17/22 239 lb (108.4 kg)  11/10/22 237 lb 6.4 oz (107.7 kg)  10/13/22 243 lb 12.8 oz (110.6 kg)    BP 134/76   Pulse 81   Ht 5\' 10"  (1.778 m)   Wt 239 lb (108.4 kg)   BMI  34.29 kg/m  Constitutional:  Pleasant, in no acute distress. Psychiatric: Normal mood and affect. Behavior is normal. Cardiovascular: Normal rate, regular rhythm. No edema Pulmonary/chest: Effort normal and breath sounds normal. No wheezing, rales or rhonchi. Abdominal: Soft, nondistended, nontender. Bowel sounds active throughout. There are no masses palpable. No hepatomegaly. Neurological: Alert and oriented to person place and time. Skin: Skin is warm and dry. No rashes noted.   ASSESSMENT AND PLAN;   1) Colon cancer screening - Schedule colonoscopy.  Will schedule to be done on a Friday due to his work schedule  2) Atrial fibrillation 3) Chronic anticoagulation 4) Diastolic CHF -Hold Eliquis 2 days before procedure - will instruct when and how to resume after procedure. Low but real risk of cardiovascular event such as heart attack, stroke, embolism, thrombosis or ischemia/infarct of other organs off Eliquis explained and need to seek urgent help if this occurs. The patient consents to proceed. Will communicate by phone or EMR with patient's prescribing provider to confirm that holding Eliquis is reasonable in this case  5) Hematochezia - Clinical description seems most consistent with benign anorectal etiology.  Will evaluate for additional pathology at time of colonoscopy as above - If clinically significant hemorrhoids, will discuss hemorrhoid band ligation further    The indications, risks, and benefits of colonoscopy were explained to the patient in detail. Risks include but are not limited to bleeding, perforation, adverse reaction to medications, and cardiopulmonary compromise. Sequelae include but are not limited to the possibility of surgery, hospitalization, and mortality. The patient verbalized understanding and wished to proceed. All questions answered, referred to the scheduler and bowel prep ordered. Further recommendations pending results of the exam.     Shellia Cleverly, DO, FACG  11/17/2022, 2:38 PM   Rema Fendt, NP

## 2022-11-20 NOTE — Telephone Encounter (Signed)
   Patient Name: [name] [name]  DOB: September 07, 1965 MRN: [id number]  Primary Cardiologist: None  Chart reviewed as part of pre-operative protocol coverage. Pre-op clearance already addressed by colleagues in earlier phone notes. To summarize recommendations:  -Per office protocol, patient can hold Eliquis for 2 days prior to procedure as requested   Will route this bundled recommendation to requesting provider via Epic fax function and remove from pre-op pool. Please call with questions.  Sharlene Dory, PA-C 11/20/2022, 8:39 AM

## 2022-11-20 NOTE — Telephone Encounter (Signed)
Patient with diagnosis of afib on Eliquis for anticoagulation.    Procedure: colonoscopy Date of procedure: 12/29/22  CHA2DS2-VASc Score = 3  This indicates a 3.2% annual risk of stroke. The patient's score is based upon: CHF History: 1 HTN History: 1 Diabetes History: 1 Stroke History: 0 Vascular Disease History: 0 Age Score: 0 Gender Score: 0  CrCl 46mL/min using adj body weight Platelet count 240K  Per office protocol, patient can hold Eliquis for 2 days prior to procedure as requested.    **This guidance is not considered finalized until pre-operative APP has relayed final recommendations.**

## 2022-11-20 NOTE — Telephone Encounter (Signed)
Patient made aware. Advised patient not to take Eliquis on 6/19, and 6/20 two days prior to procedure.

## 2022-11-23 DIAGNOSIS — G4733 Obstructive sleep apnea (adult) (pediatric): Secondary | ICD-10-CM | POA: Diagnosis not present

## 2022-11-28 DIAGNOSIS — G4733 Obstructive sleep apnea (adult) (pediatric): Secondary | ICD-10-CM | POA: Diagnosis not present

## 2022-12-07 ENCOUNTER — Other Ambulatory Visit: Payer: Self-pay | Admitting: Family

## 2022-12-07 DIAGNOSIS — E119 Type 2 diabetes mellitus without complications: Secondary | ICD-10-CM

## 2022-12-08 NOTE — Telephone Encounter (Signed)
Complete

## 2022-12-14 ENCOUNTER — Encounter: Payer: Self-pay | Admitting: Gastroenterology

## 2022-12-15 ENCOUNTER — Ambulatory Visit: Payer: BC Managed Care – PPO | Admitting: Cardiology

## 2022-12-18 ENCOUNTER — Emergency Department (HOSPITAL_COMMUNITY)
Admission: EM | Admit: 2022-12-18 | Discharge: 2022-12-18 | Disposition: A | Discharge status: Home / Self Care | Payer: BC Managed Care – PPO | Attending: Emergency Medicine | Admitting: Emergency Medicine

## 2022-12-18 ENCOUNTER — Other Ambulatory Visit: Payer: Self-pay

## 2022-12-18 ENCOUNTER — Emergency Department (HOSPITAL_COMMUNITY): Payer: BC Managed Care – PPO

## 2022-12-18 ENCOUNTER — Encounter (HOSPITAL_COMMUNITY): Payer: Self-pay

## 2022-12-18 DIAGNOSIS — S0181XA Laceration without foreign body of other part of head, initial encounter: Secondary | ICD-10-CM | POA: Diagnosis not present

## 2022-12-18 DIAGNOSIS — Y990 Civilian activity done for income or pay: Secondary | ICD-10-CM | POA: Insufficient documentation

## 2022-12-18 DIAGNOSIS — Z7901 Long term (current) use of anticoagulants: Secondary | ICD-10-CM | POA: Insufficient documentation

## 2022-12-18 DIAGNOSIS — S0990XA Unspecified injury of head, initial encounter: Secondary | ICD-10-CM | POA: Insufficient documentation

## 2022-12-18 DIAGNOSIS — S0993XA Unspecified injury of face, initial encounter: Secondary | ICD-10-CM | POA: Diagnosis not present

## 2022-12-18 MED ORDER — LIDOCAINE-EPINEPHRINE-TETRACAINE (LET) TOPICAL GEL
3.0000 mL | Freq: Once | TOPICAL | Status: AC
  Administered 2022-12-18: 3 mL via TOPICAL
  Filled 2022-12-18: qty 3

## 2022-12-18 MED ORDER — LIDOCAINE-EPINEPHRINE (PF) 2 %-1:200000 IJ SOLN
10.0000 mL | Freq: Once | INTRAMUSCULAR | Status: AC
  Administered 2022-12-18: 10 mL
  Filled 2022-12-18: qty 20

## 2022-12-18 NOTE — Discharge Instructions (Addendum)
Have the stitches taken out in around 5 -7days 

## 2022-12-18 NOTE — ED Notes (Signed)
Clean pt wound with sterile water and gauze.

## 2022-12-18 NOTE — ED Provider Notes (Signed)
[place] EMERGENCY DEPARTMENT AT [facility] Provider Note   CSN: [id number] Arrival date & time: 12/18/22  1823     History  Chief Complaint  Patient presents with   Head Laceration    [name] [name] is a 57 y.o. male.   Head Laceration  Patient presents after head wound.  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  States his head went forward and hit a pole on the forklift and a laceration of the eye.  No loss conscious.  Unknown tetanus.  No other injury.  States he does have a bump on the back of his head which she thinks may be an abscess.  No fevers.     Home Medications Prior to Admission medications   Medication Sig Start Date End Date Taking? Authorizing Provider  acetaminophen (TYLENOL) 500 MG tablet Take 1,000 mg by mouth as needed for mild pain, moderate pain or headache. Patient not taking: Reported on 11/17/2022    [provider]  apixaban (ELIQUIS) 5 MG TABS tablet Take 1 tablet (5 mg total) by mouth 2 (two) times daily. 08/25/22   Fenton, Clint R, PA  atorvastatin (LIPITOR) 40 MG tablet Take 1 tablet (40 mg total) by mouth daily. 11/10/22   Rema Fendt, NP  dapagliflozin propanediol (FARXIGA) 10 MG TABS tablet Take 1 tablet (10 mg total) by mouth daily before breakfast. 11/10/22   Rema Fendt, NP  diltiazem (CARDIZEM CD) 120 MG 24 hr capsule Take 1 capsule (120 mg total) by mouth 2 (two) times daily. 08/25/22   Fenton, Clint R, PA  flecainide (TAMBOCOR) 100 MG tablet Take 1 tablet (100 mg total) by mouth 2 (two) times daily. 07/19/22   Fenton, Clint R, PA  sitaGLIPtin (JANUVIA) 50 MG tablet TAKE 1 TABLET BY MOUTH DAILY 12/08/22   Rema Fendt, NP      Allergies    Metformin and related    Review of Systems   Review of Systems  Physical Exam Updated Vital Signs BP (!) 150/75 (BP Location: Right Arm)   Pulse 87   Temp 98.7 F (37.1 C) (Oral)   Resp 18   SpO2 96%  Physical Exam Vitals and nursing note reviewed.   HENT:     Head:     Comments: Horizontal laceration across forehead just between the eyes to into the left eyebrow with return.  Approximately 7cm.  Does have full movement of eyes.  Small area of fluctuance with central scab on occipital area.  Needle put into it with mild bloody return without any purulence. Musculoskeletal:     Cervical back: No tenderness.  Neurological:     Mental Status: He is alert.     ED Results / Procedures / Treatments   Labs (all labs ordered are listed, but only abnormal results are displayed) Labs Reviewed - No data to display  EKG None  Radiology CT HEAD WO CONTRAST ( )  Result Date: 12/18/2022 CLINICAL DATA:  Forehead laceration. EXAM: CT HEAD WITHOUT CONTRAST TECHNIQUE: Contiguous axial images were obtained from the base of the skull through the vertex without intravenous contrast. RADIATION DOSE REDUCTION: This exam was performed according to the departmental dose-optimization program which includes automated exposure control, adjustment of the mA and/or kV according to patient size and/or use of iterative reconstruction technique. COMPARISON:  None Available. FINDINGS: Brain: There is no acute intracranial hemorrhage, extra-axial fluid collection, or acute infarct. Parenchymal volume is normal. The ventricles are normal in  size. Gray-white differentiation is preserved. The pituitary and suprasellar region are normal. There is no mass lesion. There is no mass effect or midline shift. Vascular: No hyperdense vessel or unexpected calcification. Skull: Normal. Negative for fracture or focal lesion. Sinuses/Orbits: There is mucosal thickening with layering fluid in the right maxillary sinus. There is a right mastoid effusion. The globes and orbits are unremarkable. Other: There is a left forehead laceration. IMPRESSION: 1. Left forehead laceration without underlying calvarial fracture. 2. No acute intracranial pathology. 3. Right mastoid effusion. 4. Layering  fluid in the right sphenoid sinus which can be seen with acute sinusitis in the correct clinical setting. Electronically Signed   By: Lesia Hausen M.D.   On: 12/18/2022 19:36    Procedures Procedures    Medications Ordered in ED Medications  lidocaine-EPINEPHrine (XYLOCAINE W/EPI) 2 %-1:200000 (PF) injection 10 mL (10 mLs Infiltration Given 12/18/22 1929)  lidocaine-EPINEPHrine-tetracaine (LET) topical gel (3 mLs Topical Given 12/18/22 1927)    ED Course/ Medical Decision Making/ A&P                             Medical Decision Making Amount and/or Complexity of Data Reviewed Radiology: ordered.  Risk Prescription drug management.   Patient with facial laceration.  Head injury.  Will get head CT.  Will also close wound.  Bump on the back of his head/neck previously could have been an abscess but no purulence now.  No other apparent injury.  CT scan done and reassuring.  Patient is on anticoagulation.  Wound closed.  Discharge home.        Final Clinical Impression(s) / ED Diagnoses Final diagnoses:  Facial laceration, initial encounter    Rx / DC Orders ED Discharge Orders     None         Benjiman Core, MD 12/18/22 2355

## 2022-12-18 NOTE — ED Triage Notes (Signed)
Pt presents w/ forehead laceration.  Pain score 10/10.  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  Pt's forehead hit metal rail on forklift.

## 2022-12-18 NOTE — ED Provider Notes (Signed)
..  Laceration Repair  Date/Time: 12/18/2022 8:54 PM  Performed by: Dorthy Cooler, PA-C Authorized by: Dorthy Cooler, PA-C   Consent:    Consent obtained:  Verbal   Consent given by:  Patient   Risks, benefits, and alternatives were discussed: yes   Universal protocol:    Patient identity confirmed:  Verbally with patient Anesthesia:    Anesthesia method:  Local infiltration   Local anesthetic:  Lidocaine 2% WITH epi Laceration details:    Location:  Scalp   Scalp location:  Frontal   Length (cm):  3 Pre-procedure details:    Preparation:  Imaging obtained to evaluate for foreign bodies Exploration:    Hemostasis achieved with:  Direct pressure and LET   Contaminated: no   Treatment:    Area cleansed with:  Povidone-iodine and saline   Amount of cleaning:  Standard   Irrigation solution:  Sterile saline   Irrigation volume:    Irrigation method:  Pressure wash   Debridement:  None Skin repair:    Repair method:  Sutures   Suture size:  5-0   Suture material:  Fast-absorbing gut   Number of sutures:  6 Approximation:    Approximation:  Close Repair type:    Repair type:  Simple Post-procedure details:    Dressing:  Non-adherent dressing Comments:     Patient tolerated procedure without complications     Dorthy Cooler, PA-C 12/18/22 2059    Benjiman Core, MD 12/19/22 0000

## 2022-12-18 NOTE — ED Notes (Signed)
As per doctor patient can return on 12/22/22

## 2022-12-29 ENCOUNTER — Encounter: Payer: Self-pay | Admitting: Gastroenterology

## 2022-12-29 ENCOUNTER — Ambulatory Visit (AMBULATORY_SURGERY_CENTER): Payer: BC Managed Care – PPO | Admitting: Gastroenterology

## 2022-12-29 VITALS — BP 127/77 | HR 72 | Temp 98.0°F | Resp 14 | Ht 70.0 in | Wt 239.0 lb

## 2022-12-29 DIAGNOSIS — Z1211 Encounter for screening for malignant neoplasm of colon: Secondary | ICD-10-CM | POA: Diagnosis not present

## 2022-12-29 DIAGNOSIS — D125 Benign neoplasm of sigmoid colon: Secondary | ICD-10-CM

## 2022-12-29 DIAGNOSIS — K5730 Diverticulosis of large intestine without perforation or abscess without bleeding: Secondary | ICD-10-CM

## 2022-12-29 DIAGNOSIS — K635 Polyp of colon: Secondary | ICD-10-CM | POA: Diagnosis not present

## 2022-12-29 DIAGNOSIS — K641 Second degree hemorrhoids: Secondary | ICD-10-CM

## 2022-12-29 MED ORDER — SODIUM CHLORIDE 0.9 % IV SOLN
500.0000 mL | Freq: Once | INTRAVENOUS | Status: DC
Start: 2022-12-29 — End: 2022-12-29

## 2022-12-29 NOTE — Op Note (Signed)
[facility] Patient Name: [name] [name] Procedure Date: 12/29/2022 1:59 PM MRN: [id number] Endoscopist: Doristine Locks , MD, [id number] Age: 57 Referring MD:  Date of Birth: [birth year] Gender: Male Account #: [id number] Procedure:                Colonoscopy Indications:              Screening for colorectal malignant neoplasm. Last                            colonoscopy was 10 years ago at an outside facility                            and normal per patient. Medicines:                Monitored Anesthesia Care Procedure:                Pre-Anesthesia Assessment:                           - Prior to the procedure, a History and Physical                            was performed, and patient medications and                            allergies were reviewed. The patient's tolerance of                            previous anesthesia was also reviewed. The risks                            and benefits of the procedure and the sedation                            options and risks were discussed with the patient.                            All questions were answered, and informed consent                            was obtained. Prior Anticoagulants: The patient has                            taken Eliquis (apixaban), last dose was 2 days                            prior to procedure. ASA Grade Assessment: II - A                            patient with mild systemic disease. After reviewing                            the risks and benefits, the patient was deemed in  satisfactory condition to undergo the procedure.                           After obtaining informed consent, the colonoscope                            was passed under direct vision. Throughout the                            procedure, the patient's blood pressure, pulse, and                            oxygen saturations were monitored continuously. The                            Olympus  [id number]-HQ190L [id number]) Colonoscope was                            introduced through the anus and advanced to the the                            cecum, identified by appendiceal orifice and                            ileocecal valve. The colonoscopy was performed                            without difficulty. The patient tolerated the                            procedure well. The quality of the bowel                            preparation was good. The ileocecal valve,                            appendiceal orifice, and rectum were photographed. Scope In: 2:06:52 PM Scope Out: 2:19:46 PM Scope Withdrawal Time: 0 hours 9 minutes 35 seconds  Total Procedure Duration: 0 hours 12 minutes 54 seconds  Findings:                 Hemorrhoids were found on perianal exam.                           A 3 mm polyp was found in the sigmoid colon. The                            polyp was sessile. The polyp was removed with a                            cold snare. Resection and retrieval were complete.                            Estimated blood loss was minimal.  A few small-mouthed diverticula were found in the                            sigmoid colon.                           Non-bleeding internal hemorrhoids were found during                            retroflexion. The hemorrhoids were small and Grade                            II (internal hemorrhoids that prolapse but reduce                            spontaneously). Complications:            No immediate complications. Estimated Blood Loss:     Estimated blood loss was minimal. Impression:               - Hemorrhoids found on perianal exam.                           - One 3 mm polyp in the sigmoid colon, removed with                            a cold snare. Resected and retrieved.                           - Diverticulosis in the sigmoid colon.                           - Non-bleeding internal hemorrhoids. Recommendation:            - Patient has a contact number available for                            emergencies. The signs and symptoms of potential                            delayed complications were discussed with the                            patient. Return to normal activities tomorrow.                            Written discharge instructions were provided to the                            patient.                           - Resume previous diet.                           - Continue present medications.                           -  Resume Eliquis (apixaban) at prior dose tomorrow.                           - Await pathology results.                           - Repeat colonoscopy for surveillance based on                            pathology results.                           - Return to GI clinic PRN.                           - Internal hemorrhoids were noted on this study and                            may be amenable to hemorrhoid band ligation. If you                            are interested in further treatment of these                            hemorrhoids with band ligation, please contact my                            clinic to set up an appointment for evaluation and                            treatment. Doristine Locks, MD 12/29/2022 2:24:33 PM

## 2022-12-29 NOTE — Progress Notes (Signed)
Called to room to assist during endoscopic procedure.  Patient ID and intended procedure confirmed with present staff. Received instructions for my participation in the procedure from the performing physician.  

## 2022-12-29 NOTE — Progress Notes (Signed)
GASTROENTEROLOGY PROCEDURE H&P NOTE   Primary Care Physician: Rema Fendt, NP    Reason for Procedure:  Colon cancer screening  Plan:    Colonoscopy  Patient is appropriate for endoscopic procedure(s) in the ambulatory ([facility]) setting.  The nature of the procedure, as well as the risks, benefits, and alternatives were carefully and thoroughly reviewed with the patient. Ample time for discussion and questions allowed. The patient understood, was satisfied, and agreed to proceed.     HPI: [name] [name] is a 57 y.o. male who presents for colonoscopy for ongoing colon cancer screening.  Last colonoscopy was in 2015 at outside facility and normal per patient.  Does have occasional episodes of BRBPR which he has attributed to hemorrhoids in the past.  Holding Eliquis for the last 2 days in anticipation of procedure today.  Past Medical History:  Diagnosis Date   ARF (acute renal failure) (HCC)    Atrial flutter (HCC)    Community acquired bacterial pneumonia    Diastolic CHF, chronic (HCC)    Leukocytosis    N&V (nausea and vomiting)    SOB (shortness of breath)    Substance abuse (HCC)    Prior hx of cocaine (quit 5 years ago)/EtOH (clean for 14 months) as of 10/2011    Past Surgical History:  Procedure Laterality Date   CARDIOVERSION N/A 10/29/2020   Procedure: CARDIOVERSION;  Surgeon: Nahser, Deloris Ping, MD;  Location: [facility] ENDOSCOPY;  Service: Cardiovascular;  Laterality: N/A;    Prior to Admission medications   Medication Sig Start Date End Date Taking? Authorizing Provider  CLENPIQ 10-3.5-12 MG-GM -GM/175ML SOLN Take by mouth once. 11/17/22  Yes [provider]  valACYclovir (VALTREX) 1000 MG tablet Take 1,000 mg by mouth 2 (two) times daily. 12/14/22  Yes [provider]  acetaminophen (TYLENOL) 500 MG tablet Take 1,000 mg by mouth as needed for mild pain, moderate pain or headache. Patient not taking: Reported on 11/17/2022    [provider]  apixaban (ELIQUIS) 5 MG TABS tablet Take 1 tablet (5 mg total) by mouth 2 (two) times daily. 08/25/22   Fenton, Clint R, PA  atorvastatin (LIPITOR) 40 MG tablet Take 1 tablet (40 mg total) by mouth daily. 11/10/22   Rema Fendt, NP  dapagliflozin propanediol (FARXIGA) 10 MG TABS tablet Take 1 tablet (10 mg total) by mouth daily before breakfast. 11/10/22   Rema Fendt, NP  diltiazem (CARDIZEM CD) 120 MG 24 hr capsule Take 1 capsule (120 mg total) by mouth 2 (two) times daily. 08/25/22   Fenton, Clint R, PA  flecainide (TAMBOCOR) 100 MG tablet Take 1 tablet (100 mg total) by mouth 2 (two) times daily. 07/19/22   Fenton, Clint R, PA  sitaGLIPtin (JANUVIA) 50 MG tablet TAKE 1 TABLET BY MOUTH DAILY 12/08/22   Rema Fendt, NP    Current Outpatient Medications  Medication Sig Dispense Refill   CLENPIQ 10-3.5-12 MG-GM -GM/175ML SOLN Take by mouth once.     valACYclovir (VALTREX) 1000 MG tablet Take 1,000 mg by mouth 2 (two) times daily.     acetaminophen (TYLENOL) 500 MG tablet Take 1,000 mg by mouth as needed for mild pain, moderate pain or headache. (Patient not taking: Reported on 11/17/2022)     apixaban (ELIQUIS) 5 MG TABS tablet Take 1 tablet (5 mg total) by mouth 2 (two) times daily. 180 tablet 3   atorvastatin (LIPITOR) 40 MG tablet Take 1 tablet (40 mg total) by mouth daily. 90 tablet  0   dapagliflozin propanediol (FARXIGA) 10 MG TABS tablet Take 1 tablet (10 mg total) by mouth daily before breakfast. 90 tablet 0   diltiazem (CARDIZEM CD) 120 MG 24 hr capsule Take 1 capsule (120 mg total) by mouth 2 (two) times daily. 180 capsule 3   flecainide (TAMBOCOR) 100 MG tablet Take 1 tablet (100 mg total) by mouth 2 (two) times daily. 180 tablet 2   sitaGLIPtin (JANUVIA) 50 MG tablet TAKE 1 TABLET BY MOUTH DAILY 90 tablet 0   No current facility-administered medications for this visit.    Allergies as of 12/29/2022 - Review Complete 12/18/2022  Allergen Reaction Noted   Metformin and  related Other (See Comments) 11/12/2020    Family History  Problem Relation Age of Onset   Diabetes Sister    Heart disease Neg Hx    Colon cancer Neg Hx    Stomach cancer Neg Hx    Esophageal cancer Neg Hx     Social History   Socioeconomic History   Marital status: Married    Spouse name: Not on file   Number of children: Not on file   Years of education: Not on file   Highest education level: Not on file  Occupational History   Occupation: forklift driver  Tobacco Use   Smoking status: Former    Types: Cigarettes    Passive exposure: Past   Smokeless tobacco: Never   Tobacco comments:    Former Smoker Quit April 2013 08/12/21  Vaping Use   Vaping Use: Never used  Substance and Sexual Activity   Alcohol use: Not Currently   Drug use: Not Currently   Sexual activity: Not on file  Other Topics Concern   Not on file  Social History Narrative   Not on file   Social Determinants of Health   Financial Resource Strain: Medium Risk (08/14/2022)   Overall Financial Resource Strain (CARDIA)    Difficulty of Paying Living Expenses: Somewhat hard  Food Insecurity: No Food Insecurity (08/04/2022)   Hunger Vital Sign    Worried About Running Out of Food in the Last Year: Never true    Ran Out of Food in the Last Year: Never true  Transportation Needs: No Transportation Needs (08/04/2022)   PRAPARE - Administrator, Civil Service (Medical): No    Lack of Transportation (Non-Medical): No  Physical Activity: Not on file  Stress: Not on file  Social Connections: Not on file  Intimate Partner Violence: Not At Risk (08/04/2022)   Humiliation, Afraid, Rape, and Kick questionnaire    Fear of Current or Ex-Partner: No    Emotionally Abused: No    Physically Abused: No    Sexually Abused: No    Physical Exam: Vital signs in last 24 hours: @There  were no vitals taken for this visit. GEN: NAD EYE: Sclerae anicteric ENT: MMM CV: Non-tachycardic Pulm: CTA b/l GI:  Soft, NT/ND NEURO:  Alert & Oriented x 3   Doristine Locks, DO [facility]   12/29/2022 1:40 PM

## 2022-12-29 NOTE — Progress Notes (Signed)
VS completed by DT.  Pt's states no medical or surgical changes since previsit or office visit.  

## 2022-12-29 NOTE — Progress Notes (Signed)
Uneventful anesthetic. Report to pacu rn. Vss. care resumed by rn. 

## 2022-12-29 NOTE — Patient Instructions (Signed)
Please read handouts provided. Continue present medications. Await pathology results. Resume Eliquis ( apixaban ) at prior dose tomorrow. Return to GI clinic as needed.   YOU HAD AN ENDOSCOPIC PROCEDURE TODAY AT THE [facility]:   Refer to the procedure report that was given to you for any specific questions about what was found during the examination.  If the procedure report does not answer your questions, please call your gastroenterologist to clarify.  If you requested that your care partner not be given the details of your procedure findings, then the procedure report has been included in a sealed envelope for you to review at your convenience later.  YOU SHOULD EXPECT: Some feelings of bloating in the abdomen. Passage of more gas than usual.  Walking can help get rid of the air that was put into your GI tract during the procedure and reduce the bloating. If you had a lower endoscopy (such as a colonoscopy or flexible sigmoidoscopy) you may notice spotting of blood in your stool or on the toilet paper. If you underwent a bowel prep for your procedure, you may not have a normal bowel movement for a few days.  Please Note:  You might notice some irritation and congestion in your nose or some drainage.  This is from the oxygen used during your procedure.  There is no need for concern and it should clear up in a day or so.  SYMPTOMS TO REPORT IMMEDIATELY:  Following lower endoscopy (colonoscopy or flexible sigmoidoscopy):  Excessive amounts of blood in the stool  Significant tenderness or worsening of abdominal pains  Swelling of the abdomen that is new, acute  Fever of 100F or higher  For urgent or emergent issues, a gastroenterologist can be reached at any hour by calling [phone]. Do not use MyChart messaging for urgent concerns.    DIET:  We do recommend a small meal at first, but then you may proceed to your regular diet.  Drink plenty of fluids but you should avoid  alcoholic beverages for 24 hours.  ACTIVITY:  You should plan to take it easy for the rest of today and you should NOT DRIVE or use heavy machinery until tomorrow (because of the sedation medicines used during the test).    FOLLOW UP: Our staff will call the number listed on your records the next business day following your procedure.  We will call around 7:15- 8:00 am to check on you and address any questions or concerns that you may have regarding the information given to you following your procedure. If we do not reach you, we will leave a message.     If any biopsies were taken you will be contacted by phone or by letter within the next 1-3 weeks.  Please call us at [phone] if you have not heard about the biopsies in 3 weeks.    SIGNATURES/CONFIDENTIALITY: You and/or your care partner have signed paperwork which will be entered into your electronic medical record.  These signatures attest to the fact that that the information above on your After Visit Summary has been reviewed and is understood.  Full responsibility of the confidentiality of this discharge information lies with you and/or your care-partner. 

## 2023-01-02 ENCOUNTER — Telehealth: Payer: Self-pay | Admitting: *Deleted

## 2023-01-02 NOTE — Telephone Encounter (Signed)
  Follow up Call-     12/29/2022    1:49 PM  Call back number  Post procedure Call Back phone  # [phone]  Permission to leave phone message Yes     Patient questions:  Do you have a fever, pain , or abdominal swelling? No. Pain Score  0 *  Have you tolerated food without any problems? Yes.    Have you been able to return to your normal activities? Yes.    Do you have any questions about your discharge instructions: Diet   No. Medications  No. Follow up visit  No.  Do you have questions or concerns about your Care? No.  Actions: * If pain score is 4 or above: No action needed, pain <4.

## 2023-01-05 ENCOUNTER — Encounter: Payer: Self-pay | Admitting: Family

## 2023-01-05 ENCOUNTER — Ambulatory Visit (INDEPENDENT_AMBULATORY_CARE_PROVIDER_SITE_OTHER): Payer: BC Managed Care – PPO | Admitting: Family

## 2023-01-05 VITALS — BP 125/73 | HR 71 | Temp 98.1°F | Ht 70.0 in | Wt 234.8 lb

## 2023-01-05 DIAGNOSIS — E1165 Type 2 diabetes mellitus with hyperglycemia: Secondary | ICD-10-CM

## 2023-01-05 DIAGNOSIS — E785 Hyperlipidemia, unspecified: Secondary | ICD-10-CM | POA: Diagnosis not present

## 2023-01-05 DIAGNOSIS — B009 Herpesviral infection, unspecified: Secondary | ICD-10-CM | POA: Diagnosis not present

## 2023-01-05 DIAGNOSIS — Z7984 Long term (current) use of oral hypoglycemic drugs: Secondary | ICD-10-CM | POA: Diagnosis not present

## 2023-01-05 LAB — POCT GLYCOSYLATED HEMOGLOBIN (HGB A1C): HbA1c, POC (controlled diabetic range): 7.1 % — AB (ref 0.0–7.0)

## 2023-01-05 MED ORDER — ATORVASTATIN CALCIUM 40 MG PO TABS
40.0000 mg | ORAL_TABLET | Freq: Every day | ORAL | 0 refills | Status: DC
Start: 2023-01-05 — End: 2023-03-02

## 2023-01-05 MED ORDER — DAPAGLIFLOZIN PROPANEDIOL 10 MG PO TABS
10.0000 mg | ORAL_TABLET | Freq: Every day | ORAL | 0 refills | Status: DC
Start: 2023-01-05 — End: 2023-04-27

## 2023-01-05 MED ORDER — VALACYCLOVIR HCL 1 G PO TABS
1000.0000 mg | ORAL_TABLET | Freq: Two times a day (BID) | ORAL | 2 refills | Status: DC
Start: 2023-01-05 — End: 2023-02-16

## 2023-01-05 MED ORDER — SITAGLIPTIN PHOSPHATE 50 MG PO TABS
50.0000 mg | ORAL_TABLET | Freq: Every day | ORAL | 0 refills | Status: DC
Start: 2023-01-05 — End: 2023-04-27

## 2023-01-05 NOTE — Progress Notes (Signed)
Patient ID: Draymond Sump, male    DOB: 1966-06-22  MRN: [id number]  CC: Chronic Care Management   Subjective: [name] [name] is a 57 y.o. male who presents for chronic care management.   His concerns today include:  - Doing well on Januvia and Farxiga, no issues/concerns. He denies red flag symptoms associated with diabetes.  - Doing well on Atorvastatin, no issues/concerns. - Needs refills of Valtrex.  Patient Active Problem List   Diagnosis Date Noted   Sickle cell trait (HCC) 08/15/2022   Polycythemia, secondary 08/15/2022   RBC microcytosis 08/04/2022   OSA (obstructive sleep apnea) 07/13/2022   Herpes 07/08/2022   Hyperlipidemia 11/30/2020   Persistent atrial fibrillation (HCC)    Paroxysmal atrial fibrillation (HCC) 05/29/2019   Substance abuse (HCC)    Diastolic CHF, chronic    Diastolic CHF, chronic (HCC) 10/24/2011   Atrial flutter (HCC) 10/22/2011   Community acquired bacterial pneumonia 10/21/2011   SOB (shortness of breath) 10/21/2011   N&V (nausea and vomiting) 10/21/2011   ARF (acute renal failure) (HCC) 10/21/2011   Tobacco abuse 10/21/2011   Leukocytosis 10/21/2011   Fever 10/21/2011     Current Outpatient Medications on File Prior to Visit  Medication Sig Dispense Refill   acetaminophen (TYLENOL) 500 MG tablet Take 1,000 mg by mouth as needed for mild pain, moderate pain or headache.     diltiazem (CARDIZEM CD) 120 MG 24 hr capsule Take 1 capsule (120 mg total) by mouth 2 (two) times daily. 180 capsule 3   flecainide (TAMBOCOR) 100 MG tablet Take 1 tablet (100 mg total) by mouth 2 (two) times daily. 180 tablet 2   apixaban (ELIQUIS) 5 MG TABS tablet Take 1 tablet (5 mg total) by mouth 2 (two) times daily. (Patient not taking: Reported on 01/05/2023) 180 tablet 3   No current facility-administered medications on file prior to visit.    Allergies  Allergen Reactions   Metformin And Related Other (See Comments)    Pt endorses blurred vision with  metformin use.     Social History   Socioeconomic History   Marital status: Married    Spouse name: Not on file   Number of children: Not on file   Years of education: Not on file   Highest education level: Not on file  Occupational History   Occupation: forklift driver  Tobacco Use   Smoking status: Former    Types: Cigarettes    Passive exposure: Past   Smokeless tobacco: Never   Tobacco comments:    Former Smoker Quit April 2013 08/12/21  Vaping Use   Vaping Use: Never used  Substance and Sexual Activity   Alcohol use: Not Currently   Drug use: Not Currently   Sexual activity: Not on file  Other Topics Concern   Not on file  Social History Narrative   Not on file   Social Determinants of Health   Financial Resource Strain: Medium Risk (08/14/2022)   Overall Financial Resource Strain (CARDIA)    Difficulty of Paying Living Expenses: Somewhat hard  Food Insecurity: No Food Insecurity (08/04/2022)   Hunger Vital Sign    Worried About Running Out of Food in the Last Year: Never true    Ran Out of Food in the Last Year: Never true  Transportation Needs: No Transportation Needs (08/04/2022)   PRAPARE - Administrator, Civil Service (Medical): No    Lack of Transportation (Non-Medical): No  Physical Activity: Not on file  Stress: Not  on file  Social Connections: Not on file  Intimate Partner Violence: Not At Risk (08/04/2022)   Humiliation, Afraid, Rape, and Kick questionnaire    Fear of Current or Ex-Partner: No    Emotionally Abused: No    Physically Abused: No    Sexually Abused: No    Family History  Problem Relation Age of Onset   Diabetes Sister    Heart disease Neg Hx    Colon cancer Neg Hx    Stomach cancer Neg Hx    Esophageal cancer Neg Hx     Past Surgical History:  Procedure Laterality Date   CARDIOVERSION N/A 10/29/2020   Procedure: CARDIOVERSION;  Surgeon: Nahser, Deloris Ping, MD;  Location: [facility] ENDOSCOPY;  Service: Cardiovascular;   Laterality: N/A;   COLONOSCOPY      ROS: Review of Systems Negative except as stated above  PHYSICAL EXAM: BP 125/73   Pulse 71   Temp 98.1 F (36.7 C) (Oral)   Ht 5\' 10"  (1.778 m)   Wt 234 lb 12.8 oz (106.5 kg)   SpO2 92%   BMI 33.69 kg/m   Physical Exam HENT:     Head: Normocephalic and atraumatic.     Nose: Nose normal.     Mouth/Throat:     Mouth: Mucous membranes are moist.     Pharynx: Oropharynx is clear.  Eyes:     Extraocular Movements: Extraocular movements intact.     Conjunctiva/sclera: Conjunctivae normal.     Pupils: Pupils are equal, round, and reactive to light.  Cardiovascular:     Rate and Rhythm: Normal rate and regular rhythm.     Pulses: Normal pulses.     Heart sounds: Normal heart sounds.  Pulmonary:     Effort: Pulmonary effort is normal.     Breath sounds: Normal breath sounds.  Musculoskeletal:     Cervical back: Normal range of motion and neck supple.  Neurological:     General: No focal deficit present.     Mental Status: He is alert and oriented to person, place, and time.  Psychiatric:        Mood and Affect: Mood normal.        Behavior: Behavior normal.      ASSESSMENT AND PLAN: 1. Type 2 diabetes mellitus with hyperglycemia, without long-term current use of insulin (HCC) - Continue Sitagliptin and Dapagliflozin Propanediol as prescribed. Counseled on medication adherence/adverse effects.  - Routine screening.  - Discussed the importance of healthy eating habits, low-carbohydrate diet, low-sugar diet, regular aerobic exercise (at least 150 minutes a week as tolerated) and medication compliance to achieve or maintain control of diabetes. - Follow-up with primary provider in 3 months or sooner if needed.  - Basic Metabolic Panel - POCT glycosylated hemoglobin (Hb A1C); Future - sitaGLIPtin (JANUVIA) 50 MG tablet; Take 1 tablet (50 mg total) by mouth daily.  Dispense: 90 tablet; Refill: 0 - dapagliflozin propanediol (FARXIGA) 10 MG  TABS tablet; Take 1 tablet (10 mg total) by mouth daily before breakfast.  Dispense: 90 tablet; Refill: 0  2. Hyperlipidemia, unspecified hyperlipidemia type - Continue Atorvastatin as prescribed. Counseled on medication adherence/adverse effects.  - Routine screening.  - Follow-up with primary provider in 3 months or sooner if needed.  - Lipid panel - atorvastatin (LIPITOR) 40 MG tablet; Take 1 tablet (40 mg total) by mouth daily.  Dispense: 90 tablet; Refill: 0  3. Herpes - Continue Valacyclovir as prescribed. Counseled on medication adherence/adverse effects.  - Follow-up with primary provider  as scheduled. - valACYclovir (VALTREX) 1000 MG tablet; Take 1 tablet (1,000 mg total) by mouth 2 (two) times daily.  Dispense: 20 tablet; Refill: 2    Patient was given the opportunity to ask questions.  Patient verbalized understanding of the plan and was able to repeat key elements of the plan. Patient was given clear instructions to go to Emergency Department or return to medical center if symptoms don't improve, worsen, or new problems develop.The patient verbalized understanding.   Orders Placed This Encounter  Procedures   Basic Metabolic Panel   Lipid panel   POCT glycosylated hemoglobin (Hb A1C)     Requested Prescriptions   Signed Prescriptions Disp Refills   valACYclovir (VALTREX) 1000 MG tablet 20 tablet 2    Sig: Take 1 tablet (1,000 mg total) by mouth 2 (two) times daily.   atorvastatin (LIPITOR) 40 MG tablet 90 tablet 0    Sig: Take 1 tablet (40 mg total) by mouth daily.   sitaGLIPtin (JANUVIA) 50 MG tablet 90 tablet 0    Sig: Take 1 tablet (50 mg total) by mouth daily.   dapagliflozin propanediol (FARXIGA) 10 MG TABS tablet 90 tablet 0    Sig: Take 1 tablet (10 mg total) by mouth daily before breakfast.    No follow-ups on file.  Rema Fendt, NP

## 2023-01-06 LAB — BASIC METABOLIC PANEL
BUN/Creatinine Ratio: 11 (ref 9–20)
BUN: 13 mg/dL (ref 6–24)
CO2: 22 mmol/L (ref 20–29)
Calcium: 9.6 mg/dL (ref 8.7–10.2)
Chloride: 103 mmol/L (ref 96–106)
Creatinine, Ser: 1.18 mg/dL (ref 0.76–1.27)
Glucose: 99 mg/dL (ref 70–99)
Potassium: 4.8 mmol/L (ref 3.5–5.2)
Sodium: 141 mmol/L (ref 134–144)
eGFR: 73 mL/min/{1.73_m2} (ref >59)

## 2023-01-06 LAB — LIPID PANEL
Chol/HDL Ratio: 2.9 ratio (ref 0.0–5.0)
Cholesterol, Total: 138 mg/dL (ref 100–199)
HDL: 47 mg/dL (ref >39)
LDL Chol Calc (NIH): 77 mg/dL (ref 0–99)
Triglycerides: 66 mg/dL (ref 0–149)
VLDL Cholesterol Cal: 14 mg/dL (ref 5–40)

## 2023-01-24 ENCOUNTER — Ambulatory Visit
Admission: EM | Admit: 2023-01-24 | Discharge: 2023-01-24 | Disposition: A | Discharge status: Home / Self Care | Payer: BC Managed Care – PPO | Attending: Internal Medicine | Admitting: Internal Medicine

## 2023-01-24 ENCOUNTER — Encounter: Payer: Self-pay | Admitting: *Deleted

## 2023-01-24 DIAGNOSIS — M545 Low back pain, unspecified: Secondary | ICD-10-CM | POA: Diagnosis not present

## 2023-01-24 MED ORDER — METHOCARBAMOL 500 MG PO TABS: 500.0000 mg | ORAL_TABLET | Freq: Two times a day (BID) | ORAL | 0 refills | Status: DC | PRN

## 2023-01-24 NOTE — ED Triage Notes (Signed)
C/O right low back pain with bilat hip pain and bilat anterior thigh pain onset 2 days ago. Denies injury. States he works out, but hasn't lifted weights x approx 1 wk. Has tried Tyl without relief.

## 2023-01-24 NOTE — Discharge Instructions (Signed)
I have prescribed you a muscle relaxer to take as needed.  Please be advised that it can make you drowsy.  Follow-up with orthopedist if symptoms persist or worsen.

## 2023-01-24 NOTE — ED Provider Notes (Signed)
[facility]    CSN: [id number] Arrival date & time: 01/24/23  4098      History   Chief Complaint Chief Complaint  Patient presents with   Back Pain    HPI [name] [name] is a 57 y.o. male.   Patient presents with bilateral lower back pain that radiates down to hips and into the thighs.  Reports that it started about 2 days ago.  Denies any injury to the area.  Reports that he has a history of sciatica.  Patient has taken Tylenol for pain relief with minimal improvement.  Denies numbness or tingling.  Denies urinary frequency, urinary or bowel continence, saddle anesthesia.   Back Pain   Past Medical History:  Diagnosis Date   ARF (acute renal failure) (HCC)    Atrial flutter (HCC)    Community acquired bacterial pneumonia    Diastolic CHF, chronic (HCC)    Leukocytosis    N&V (nausea and vomiting)    SOB (shortness of breath)    Substance abuse (HCC)    Prior hx of cocaine (quit 5 years ago)/EtOH (clean for 14 months) as of 10/2011    Patient Active Problem List   Diagnosis Date Noted   Sickle cell trait (HCC) 08/15/2022   Polycythemia, secondary 08/15/2022   RBC microcytosis 08/04/2022   OSA (obstructive sleep apnea) 07/13/2022   Herpes 07/08/2022   Hyperlipidemia 11/30/2020   Persistent atrial fibrillation (HCC)    Paroxysmal atrial fibrillation (HCC) 05/29/2019   Substance abuse (HCC)    Diastolic CHF, chronic    Diastolic CHF, chronic (HCC) 10/24/2011   Atrial flutter (HCC) 10/22/2011   Community acquired bacterial pneumonia 10/21/2011   SOB (shortness of breath) 10/21/2011   N&V (nausea and vomiting) 10/21/2011   ARF (acute renal failure) (HCC) 10/21/2011   Tobacco abuse 10/21/2011   Leukocytosis 10/21/2011   Fever 10/21/2011    Past Surgical History:  Procedure Laterality Date   CARDIOVERSION N/A 10/29/2020   Procedure: CARDIOVERSION;  Surgeon: Nahser, Deloris Ping, MD;  Location: [facility] ENDOSCOPY;  Service: Cardiovascular;  Laterality: N/A;    COLONOSCOPY         Home Medications    Prior to Admission medications   Medication Sig Start Date End Date Taking? Authorizing Provider  acetaminophen (TYLENOL) 500 MG tablet Take 1,000 mg by mouth as needed for mild pain, moderate pain or headache.   Yes [provider]  apixaban (ELIQUIS) 5 MG TABS tablet Take 1 tablet (5 mg total) by mouth 2 (two) times daily. 08/25/22  Yes Fenton, Clint R, PA  atorvastatin (LIPITOR) 40 MG tablet Take 1 tablet (40 mg total) by mouth daily. 01/05/23  Yes Zonia Kief, Amy J, NP  dapagliflozin propanediol (FARXIGA) 10 MG TABS tablet Take 1 tablet (10 mg total) by mouth daily before breakfast. 01/05/23  Yes Zonia Kief, Amy J, NP  diltiazem (CARDIZEM CD) 120 MG 24 hr capsule Take 1 capsule (120 mg total) by mouth 2 (two) times daily. 08/25/22  Yes Fenton, Clint R, PA  flecainide (TAMBOCOR) 100 MG tablet Take 1 tablet (100 mg total) by mouth 2 (two) times daily. 07/19/22  Yes Fenton, Clint R, PA  methocarbamol (ROBAXIN) 500 MG tablet Take 1 tablet (500 mg total) by mouth 2 (two) times daily as needed for muscle spasms. 01/24/23  Yes , Rolly Salter E, FNP  sitaGLIPtin (JANUVIA) 50 MG tablet Take 1 tablet (50 mg total) by mouth daily. 01/05/23  Yes Zonia Kief, Amy J, NP  valACYclovir (VALTREX) 1000 MG tablet Take  1 tablet (1,000 mg total) by mouth 2 (two) times daily. 01/05/23  Yes Rema Fendt, NP    Family History Family History  Problem Relation Age of Onset   Diabetes Sister    Heart disease Neg Hx    Colon cancer Neg Hx    Stomach cancer Neg Hx    Esophageal cancer Neg Hx     Social History Social History   Tobacco Use   Smoking status: Former    Types: Cigarettes    Passive exposure: Past   Smokeless tobacco: Never   Tobacco comments:    Former Smoker Quit April 2013 08/12/21  Vaping Use   Vaping status: Never Used  Substance Use Topics   Alcohol use: Not Currently   Drug use: Not Currently    Comment: "clean for long time"      Allergies   Metformin and related   Review of Systems Review of Systems Per HPI  Physical Exam Triage Vital Signs ED Triage Vitals  Encounter Vitals Group     BP 01/24/23 0828 132/75     Systolic BP Percentile --      Diastolic BP Percentile --      Pulse Rate 01/24/23 0828 72     Resp 01/24/23 0828 16     Temp 01/24/23 0828 97.9 F (36.6 C)     Temp Source 01/24/23 0828 Oral     SpO2 01/24/23 0828 94 %     Weight --      Height --      Head Circumference --      Peak Flow --      Pain Score 01/24/23 0829 10     Pain Loc --      Pain Education --      Exclude from Growth Chart --    No data found.  Updated Vital Signs BP 132/75   Pulse 72   Temp 97.9 F (36.6 C) (Oral)   Resp 16   SpO2 94%   Visual Acuity Right Eye Distance:   Left Eye Distance:   Bilateral Distance:    Right Eye Near:   Left Eye Near:    Bilateral Near:     Physical Exam Constitutional:      General: He is not in acute distress.    Appearance: Normal appearance. He is not toxic-appearing or diaphoretic.  HENT:     Head: Normocephalic and atraumatic.  Eyes:     Extraocular Movements: Extraocular movements intact.     Conjunctiva/sclera: Conjunctivae normal.  Pulmonary:     Effort: Pulmonary effort is normal.  Musculoskeletal:     Lumbar back: No swelling, edema or signs of trauma. Positive right straight leg raise test and positive left straight leg raise test.       Back:     Comments: There is no tenderness to palpation across patient's lower back.  No direct spinal tenderness, crepitus, step-off noted.  Neurological:     General: No focal deficit present.     Mental Status: He is alert and oriented to person, place, and time. Mental status is at baseline.     Deep Tendon Reflexes: Reflexes are normal and symmetric.  Psychiatric:        Mood and Affect: Mood normal.        Behavior: Behavior normal.        Thought Content: Thought content normal.        Judgment:  Judgment normal.  UC Treatments / Results  Labs (all labs ordered are listed, but only abnormal results are displayed) Labs Reviewed - No data to display  EKG   Radiology No results found.  Procedures Procedures (including critical care time)  Medications Ordered in UC Medications - No data to display  Initial Impression / Assessment and Plan / UC Course  I have reviewed the triage vital signs and the nursing notes.  Pertinent labs & imaging results that were available during my care of the patient were reviewed by me and considered in my medical decision making (see chart for details).     Suspect lumbar strain versus sciatica pain.  Will treat with muscle relaxer.  Advised patient that muscle relaxer can make him drowsy and do not drive or drink alcohol with taking it.  Advised Tylenol.  Patient unable to take NSAIDs given he takes Eliquis.  Will avoid prednisone given history of diabetes at this time.  Advised supportive care and following with orthopedist at provided contact information if symptoms persist or worsen.  Patient verbalized understanding and was agreeable with plan. Final Clinical Impressions(s) / UC Diagnoses   Final diagnoses:  Acute bilateral low back pain, unspecified whether sciatica present     Discharge Instructions      I have prescribed you a muscle relaxer to take as needed.  Please be advised that it can make you drowsy.  Follow-up with orthopedist if symptoms persist or worsen.    ED Prescriptions     Medication Sig Dispense Auth. Provider   methocarbamol (ROBAXIN) 500 MG tablet Take 1 tablet (500 mg total) by mouth 2 (two) times daily as needed for muscle spasms. 20 tablet [place], Acie Fredrickson, [place]      PDMP not reviewed this encounter.   Gustavus Bryant, [place] 01/24/23 [phone]

## 2023-01-26 ENCOUNTER — Emergency Department (HOSPITAL_COMMUNITY)
Admission: EM | Admit: 2023-01-26 | Discharge: 2023-01-26 | Disposition: A | Discharge status: Home / Self Care | Payer: BC Managed Care – PPO | Attending: Emergency Medicine | Admitting: Emergency Medicine

## 2023-01-26 ENCOUNTER — Other Ambulatory Visit: Payer: Self-pay

## 2023-01-26 ENCOUNTER — Encounter (HOSPITAL_COMMUNITY): Payer: Self-pay

## 2023-01-26 DIAGNOSIS — I5032 Chronic diastolic (congestive) heart failure: Secondary | ICD-10-CM | POA: Diagnosis not present

## 2023-01-26 DIAGNOSIS — M5442 Lumbago with sciatica, left side: Secondary | ICD-10-CM | POA: Insufficient documentation

## 2023-01-26 DIAGNOSIS — M5441 Lumbago with sciatica, right side: Secondary | ICD-10-CM | POA: Insufficient documentation

## 2023-01-26 DIAGNOSIS — M545 Low back pain, unspecified: Secondary | ICD-10-CM | POA: Diagnosis not present

## 2023-01-26 LAB — CBC
HCT: 45.1 % (ref 39.0–52.0)
Hemoglobin: 14.2 g/dL (ref 13.0–17.0)
MCH: 21.6 pg — ABNORMAL LOW (ref 26.0–34.0)
MCHC: 31.5 g/dL (ref 30.0–36.0)
MCV: 68.5 fL — ABNORMAL LOW (ref 80.0–100.0)
Platelets: 228 10*3/uL (ref 150–400)
RBC: 6.58 MIL/uL — ABNORMAL HIGH (ref 4.22–5.81)
RDW: 18.9 % — ABNORMAL HIGH (ref 11.5–15.5)
WBC: 6 10*3/uL (ref 4.0–10.5)
nRBC: 0 % (ref 0.0–0.2)

## 2023-01-26 LAB — BASIC METABOLIC PANEL
Anion gap: 9 (ref 5–15)
BUN: 10 mg/dL (ref 6–20)
CO2: 21 mmol/L — ABNORMAL LOW (ref 22–32)
Calcium: 8.8 mg/dL — ABNORMAL LOW (ref 8.9–10.3)
Chloride: 107 mmol/L (ref 98–111)
Creatinine, Ser: 1.27 mg/dL — ABNORMAL HIGH (ref 0.61–1.24)
GFR, Estimated: >60 mL/min (ref >60)
Glucose, Bld: 84 mg/dL (ref 70–99)
Potassium: 4.2 mmol/L (ref 3.5–5.1)
Sodium: 137 mmol/L (ref 135–145)

## 2023-01-26 MED ORDER — OXYCODONE-ACETAMINOPHEN 5-325 MG PO TABS: 1.0000 | ORAL_TABLET | Freq: Four times a day (QID) | ORAL | 0 refills | Status: DC | PRN

## 2023-01-26 MED ORDER — OXYCODONE-ACETAMINOPHEN 5-325 MG PO TABS
1.0000 | ORAL_TABLET | Freq: Once | ORAL | Status: AC
  Administered 2023-01-26: 1 via ORAL
  Filled 2023-01-26: qty 1

## 2023-01-26 MED ORDER — METHYLPREDNISOLONE 4 MG PO TBPK: ORAL_TABLET | ORAL | 0 refills | Status: DC

## 2023-01-26 NOTE — ED Triage Notes (Addendum)
Pt to ED c/o back pain x 1 week,Reports lower back pain, Evaluated at [facility] for the same, reports prescribed muscle relaxer with no relief. Reports hx of the same. No known injury, Pt ambulatory in triage.  No incontinence.

## 2023-01-26 NOTE — ED Provider Triage Note (Signed)
Emergency Medicine Provider Triage Evaluation Note  [name] [name] , a 57 y.o. male  was evaluated in triage.  Pt complains of back pain with bilateral sciatica.  Review of Systems  Positive:  Negative:   Physical Exam  BP 139/79 (BP Location: Right Arm)   Pulse 76   Temp 98.2 F (36.8 C) (Oral)   Resp 18   Ht 5\' 10"  (1.778 m)   Wt 106.1 kg   SpO2 97%   BMI 33.58 kg/m  Gen:   Awake, no distress   Resp:  Normal effort  MSK:   Moves extremities without difficulty  Other:    Medical Decision Making  Medically screening exam initiated at 12:21 PM.  Appropriate orders placed.  Rhyker Silversmith was informed that the remainder of the evaluation will be completed by another provider, this initial triage assessment does not replace that evaluation, and the importance of remaining in the ED until their evaluation is complete.  Hx of chronic back pain. Complaining of back pain with bilateral sciatica x1 weeks. Seen in UC 2 days ago and discharged with muscle relaxer. Muscle relaxers did not help. On Eliquis for Afib and states that he cannot take NSAIDs. Tylenol has not been helping. Naproxen helped 2 days ago. States that steroids helped in the past. Patient ambulatory.   Patient denies urinary retention, fecal incontinence, saddle anesthesia, lower extremity weakness, hx of cancer, fever, immunosuppression, IVDU, spinal procedure, significant trauma.  Ordering basic labs.   Dorthy Cooler, [place] 01/26/23 1228

## 2023-01-26 NOTE — Discharge Instructions (Addendum)
You are seen today in the emergency department for back pain, while you are here we performed physical exam, monitor your vital signs, and gave you pain medication.  These were all reassuring and there is no need for any further workup while you are here in the emergency department.  Please follow-up with your primary care doctor soon as you are able.  I have sent a prescription for steroids to your pharmacy.  Please be cautious of your blood sugars while you are taking this medication and check it more frequently than you normally would.  Continue to take Tylenol for pain.  And please make sure that you are taking it on a regular basis, every 6 hours.  You can take up to 1000 mg every 6 hours as needed.  Do not exceed more than 4000 mg in a 24-hour period.  Additionally since naproxen is works for you, you can take this as long as you do not have a medical condition that would prevent you from taking it.  Please follow the instructions on the packaging.  Come back to the emergency department if you have any new or worsening back pain, any numbness or tingling in any lower extremity or leg, any difficulty with urination or bowel movement.  Or if you have any other concerns.

## 2023-01-26 NOTE — ED Provider Notes (Signed)
[place] [facility] AT [facility] Provider Note  MDM   HPI/ROS:  [name] [name] is a 57 y.o. male with a medical history as below who presents for evaluation of low back pain and bilateral leg pain.  Reports that started about a week ago, primarily located in the right upper buttock and radiates across the low back.  Reports that he has shooting pains going down both legs.  Denies any saddle anesthesia, urinary or fecal incontinence, or difficulty walking.  Does report he has a history of sciatica and this feels similar.  No other symptoms.  Of note he was seen approximately 3 days ago for similar at an urgent care, given muscle relaxer.  He does report that the muscle relaxer has not worked for him.  He said he cannot take NSAIDs because he is on Eliquis, however he did report that naproxen worked well for him the other day.  Tylenol works intermittently.  Denies any hematuria or constipation.  Physical exam is notable for: - No focal neurologic deficit - No evidence of trauma  - No point tenderness, step off or deformity in the L spine - No CVA tenderness on exam   On my initial evaluation, patient is:  -Vital signs stable. Patient afebrile, hemodynamically stable, and non-toxic appearing. -Additional history obtained from chart review  This patient's current presentation, including their history and physical exam, is most consistent with atraumatic low back pain most likely from musculoskeletal spasm versus strain versus sciatica.  He does not have any red flags on history or physical exam.  Presentation is not consistent with malignancy, he does not have any history of malignancy and no reported B symptoms.  Denies any associated trauma, so low suspicion for fracture.  Presentation is not consistent with any cord compression syndrome such as cauda equina as he does not have any bowel or urinary incontinence no evidence of retention no saddle anesthesia or distal focal  neurologic deficit.  No evidence of renal colic or pyelonephritis.   Given the clinical picture, there is no indication for additional imaging at this time.  His pain was well-controlled with a single dose of percocet.   Will send patient with medrol dosepak and short course of oral narcotic medication. Instructed to follow up with PCP as soon as he is able.    Disposition: Discharge   I discussed the plan for discharge with the patient and/or their surrogate at bedside prior to discharge and they were in agreement with the plan and verbalized understanding of the return precautions provided. All questions answered to the best of my ability. Ultimately, the patient was discharged in stable condition with stable vital signs. I am reassured that they are capable of close follow up and good social support at home.   Clinical Impression:  1. Acute low back pain with bilateral sciatica, unspecified back pain laterality     Rx / DC Orders ED Discharge Orders          Ordered    methylPREDNISolone (MEDROL DOSEPAK) 4 MG TBPK tablet        01/26/23 1827    oxyCODONE-acetaminophen (PERCOCET/ROXICET) 5-325 MG tablet  Every 6 hours PRN        01/26/23 1837            The plan for this patient was discussed with Dr. Dalene Seltzer, who voiced agreement and who oversaw evaluation and treatment of this patient.   Clinical Complexity A medically appropriate history, review of systems,  and physical exam was performed.  My independent interpretations of EKG, labs, and radiology are documented in the ED course above.   If decision rules were used in this patient's evaluation, they are listed below.   Click here for ABCD2, HEART and other calculatorsREFRESH Note before signing   Patient's presentation is most consistent with acute complicated illness / injury requiring diagnostic workup.  Medical Decision Making Amount and/or Complexity of Data Reviewed External Data Reviewed: notes.     Details: Outpatient visit on 7/16  Risk OTC drugs. Prescription drug management. Diagnosis or treatment significantly limited by social determinants of health.    HPI/ROS      See MDM section for pertinent HPI and ROS. A complete ROS was performed with pertinent positives/negatives noted above.   Past Medical History:  Diagnosis Date   ARF (acute renal failure) (HCC)    Atrial flutter (HCC)    Community acquired bacterial pneumonia    Diastolic CHF, chronic (HCC)    Leukocytosis    N&V (nausea and vomiting)    SOB (shortness of breath)    Substance abuse (HCC)    Prior hx of cocaine (quit 5 years ago)/EtOH (clean for 14 months) as of 10/2011    Past Surgical History:  Procedure Laterality Date   CARDIOVERSION N/A 10/29/2020   Procedure: CARDIOVERSION;  Surgeon: Nahser, Deloris Ping, MD;  Location: [facility] ENDOSCOPY;  Service: Cardiovascular;  Laterality: N/A;   COLONOSCOPY        Physical Exam   Vitals:   01/26/23 1140 01/26/23 1213 01/26/23 1608 01/26/23 1920  BP: 139/79  130/74 (!) 140/79  Pulse: 76  67 78  Resp: 18  16 18   Temp: 98.2 F (36.8 C)  97.9 F (36.6 C)   TempSrc: Oral     SpO2: 97%  95% 100%  Weight:  106.1 kg    Height:  5\' 10"  (1.778 m)      Physical Exam Vitals and nursing note reviewed.  Constitutional:      General: He is not in acute distress.    Appearance: He is well-developed.  HENT:     Head: Normocephalic and atraumatic.  Eyes:     Extraocular Movements: Extraocular movements intact.     Conjunctiva/sclera: Conjunctivae normal.     Pupils: Pupils are equal, round, and reactive to light.  Cardiovascular:     Rate and Rhythm: Normal rate and regular rhythm.     Heart sounds: No murmur heard. Pulmonary:     Effort: Pulmonary effort is normal. No respiratory distress.     Breath sounds: Normal breath sounds.  Abdominal:     Palpations: Abdomen is soft.     Tenderness: There is no abdominal tenderness.  Musculoskeletal:        General:  No swelling.     Cervical back: Neck supple. No tenderness or bony tenderness.     Thoracic back: No deformity, tenderness or bony tenderness. Normal range of motion.     Lumbar back: No swelling, deformity, signs of trauma, tenderness or bony tenderness. Normal range of motion.  Skin:    General: Skin is warm and dry.     Capillary Refill: Capillary refill takes less than 2 seconds.  Neurological:     General: No focal deficit present.     Mental Status: He is alert and oriented to person, place, and time.     Sensory: No sensory deficit.     Motor: No weakness.     Gait: Gait normal.  Psychiatric:        Mood and Affect: Mood normal.      Procedures   If procedures were preformed on this patient, they are listed below:  Procedures   Fayrene Helper, MD Emergency Medicine PGY-2   Please note that this documentation was produced with the assistance of voice-to-text technology and may contain errors.    Fayrene Helper, MD 01/27/23 Pearla Dubonnet, MD 01/29/23 2214

## 2023-02-16 ENCOUNTER — Other Ambulatory Visit: Payer: Self-pay | Admitting: Family

## 2023-02-16 ENCOUNTER — Telehealth: Payer: Self-pay | Admitting: Family

## 2023-02-16 DIAGNOSIS — B009 Herpesviral infection, unspecified: Secondary | ICD-10-CM

## 2023-02-16 MED ORDER — VALACYCLOVIR HCL 1 G PO TABS: 1000.0000 mg | ORAL_TABLET | Freq: Two times a day (BID) | ORAL | 2 refills | Status: DC

## 2023-02-16 NOTE — Telephone Encounter (Signed)
Patient would like refill for Valtrex sent to pharmacy

## 2023-02-16 NOTE — Telephone Encounter (Signed)
Complete

## 2023-02-23 ENCOUNTER — Ambulatory Visit (HOSPITAL_COMMUNITY)
Admission: RE | Admit: 2023-02-23 | Discharge: 2023-02-23 | Disposition: A | Discharge status: Home / Self Care | Payer: BC Managed Care – PPO | Source: Ambulatory Visit | Attending: Physician Assistant | Admitting: Physician Assistant

## 2023-02-23 ENCOUNTER — Encounter (HOSPITAL_COMMUNITY): Payer: Self-pay | Admitting: Physician Assistant

## 2023-02-23 VITALS — BP 132/72 | HR 78 | Ht 70.0 in | Wt 239.0 lb

## 2023-02-23 DIAGNOSIS — Z5181 Encounter for therapeutic drug level monitoring: Secondary | ICD-10-CM

## 2023-02-23 DIAGNOSIS — D6869 Other thrombophilia: Secondary | ICD-10-CM | POA: Insufficient documentation

## 2023-02-23 DIAGNOSIS — Z7901 Long term (current) use of anticoagulants: Secondary | ICD-10-CM | POA: Insufficient documentation

## 2023-02-23 DIAGNOSIS — I483 Typical atrial flutter: Secondary | ICD-10-CM | POA: Insufficient documentation

## 2023-02-23 DIAGNOSIS — I480 Paroxysmal atrial fibrillation: Secondary | ICD-10-CM | POA: Diagnosis not present

## 2023-02-23 DIAGNOSIS — Z79899 Other long term (current) drug therapy: Secondary | ICD-10-CM | POA: Diagnosis not present

## 2023-02-23 DIAGNOSIS — E669 Obesity, unspecified: Secondary | ICD-10-CM | POA: Diagnosis not present

## 2023-02-23 DIAGNOSIS — Z6834 Body mass index (BMI) 34.0-34.9, adult: Secondary | ICD-10-CM | POA: Diagnosis not present

## 2023-02-23 DIAGNOSIS — G4733 Obstructive sleep apnea (adult) (pediatric): Secondary | ICD-10-CM | POA: Diagnosis not present

## 2023-02-23 DIAGNOSIS — I5032 Chronic diastolic (congestive) heart failure: Secondary | ICD-10-CM | POA: Diagnosis not present

## 2023-02-23 MED ORDER — FLECAINIDE ACETATE 50 MG PO TABS: 50.0000 mg | ORAL_TABLET | Freq: Two times a day (BID) | ORAL | 1 refills | Status: DC

## 2023-02-23 MED ORDER — DILTIAZEM HCL ER COATED BEADS 120 MG PO CP24: 120.0000 mg | ORAL_CAPSULE | Freq: Two times a day (BID) | ORAL | 3 refills | Status: DC

## 2023-02-23 MED ORDER — APIXABAN 5 MG PO TABS: 5.0000 mg | ORAL_TABLET | Freq: Two times a day (BID) | ORAL | 3 refills | Status: DC

## 2023-02-23 NOTE — Addendum Note (Signed)
Encounter addended by: Learta Codding, CMA on: 02/23/2023 11:00 AM  Actions taken: Order Reconciliation Section accessed, Medication long-term status modified, Pharmacy for encounter modified, Order list changed

## 2023-02-23 NOTE — Progress Notes (Signed)
Primary Care Physician: Rema Fendt, NP Primary Cardiologist: Dr Delton See (remotely) Primary Electrophysiologist: none Referring Physician: [facility]   [name] [name] is a 57 y.o. male with a history of paroxysmal afib, atrial flutter, diastolic CHF, OSA, DM, and tobacco use. He was initially diagnosed with afib in 2013 in the setting of CAP. He spontaneously converted back to SR. Unfortunately, patient tested positive for COVID-19 and was found to be in atrial flutter and later in afib at the ER. He converted to SR after diltiazem drip. He was asymptomatic during the episode. He denies any alcohol use.   Patient has had multiple ED visits for various reasons. He presented to the ED on 10/07/20 for cough and sore throat and was incidentally found to be in rapid atrial flutter. He underwent DCCV at that time. He does admit to taking psuedophedrine before the onset of his symptoms. He was back in rapid flutter on follow up and started on flecainide. Patient is s/p DCCV 10/29/20.   On follow up today, patient reports that he has done very well since his last visit. He denies any interim symptoms of afib. He has been using his CPAP faithfully and feels much more energetic. No bleeding issues on anticoagulation.   Today, he denies symptoms of palpitations, chest pain, shortness of breath, orthopnea, PND, lower extremity edema, dizziness, or neurologic sequela. The patient is tolerating medications without difficulties and is otherwise without complaint today.    Atrial Fibrillation Risk Factors:  he does have symptoms or diagnosis of sleep apnea. He is compliant with CPAP he does have a history of alcohol use. Does not drink currently.   Atrial Fibrillation Management history:  Previous antiarrhythmic drugs: flecainide  Previous cardioversions: 10/29/20 Previous ablations: none Anticoagulation history: Eliquis   Past Medical History:  Diagnosis Date   ARF (acute renal failure)  (HCC)    Atrial flutter (HCC)    Community acquired bacterial pneumonia    Diastolic CHF, chronic (HCC)    Leukocytosis    N&V (nausea and vomiting)    SOB (shortness of breath)    Substance abuse (HCC)    Prior hx of cocaine (quit 5 years ago)/EtOH (clean for 14 months) as of 10/2011    Current Outpatient Medications  Medication Sig Dispense Refill   acetaminophen (TYLENOL) 500 MG tablet Take 1,000 mg by mouth as needed for mild pain, moderate pain or headache.     apixaban (ELIQUIS) 5 MG TABS tablet Take 1 tablet (5 mg total) by mouth 2 (two) times daily. 180 tablet 3   atorvastatin (LIPITOR) 40 MG tablet Take 1 tablet (40 mg total) by mouth daily. 90 tablet 0   dapagliflozin propanediol (FARXIGA) 10 MG TABS tablet Take 1 tablet (10 mg total) by mouth daily before breakfast. 90 tablet 0   diltiazem (CARDIZEM CD) 120 MG 24 hr capsule Take 1 capsule (120 mg total) by mouth 2 (two) times daily. 180 capsule 3   flecainide (TAMBOCOR) 100 MG tablet Take 1 tablet (100 mg total) by mouth 2 (two) times daily. 180 tablet 2   methocarbamol (ROBAXIN) 500 MG tablet Take 1 tablet (500 mg total) by mouth 2 (two) times daily as needed for muscle spasms. 20 tablet 0   methylPREDNISolone (MEDROL DOSEPAK) 4 MG TBPK tablet Follow dose pack instructions 21 tablet 0   oxyCODONE-acetaminophen (PERCOCET/ROXICET) 5-325 MG tablet Take 1 tablet by mouth every 6 (six) hours as needed for severe pain. 12 tablet 0   sitaGLIPtin (JANUVIA)  50 MG tablet Take 1 tablet (50 mg total) by mouth daily. 90 tablet 0   valACYclovir (VALTREX) 1000 MG tablet Take 1 tablet (1,000 mg total) by mouth 2 (two) times daily. 20 tablet 2   No current facility-administered medications for this encounter.    ROS- All systems are reviewed and negative except as per the HPI above.  Physical Exam: Vitals:   02/23/23 1011  BP: 132/72  Pulse: 78  Weight: 108.4 kg  Height: 5\' 10"  (1.778 m)    GEN: Well nourished, well developed in no  acute distress NECK: No JVD; No carotid bruits CARDIAC: Regular rate and rhythm, no murmurs, rubs, gallops RESPIRATORY:  Clear to auscultation without rales, wheezing or rhonchi  ABDOMEN: Soft, non-tender, non-distended EXTREMITIES:  No edema; No deformity    Wt Readings from Last 3 Encounters:  02/23/23 108.4 kg  01/26/23 106.1 kg  01/05/23 106.5 kg    EKG today demonstrates  SR Vent. rate 78 BPM PR interval 172 ms QRS duration 92 ms QT/QTcB 340/387 ms  Echo 11/19/20 demonstrated   1. Left ventricular ejection fraction, by estimation, is 55 to 60%. The  left ventricle has normal function. The left ventricle demonstrates global hypokinesis. Left ventricular diastolic parameters were normal.   2. Right ventricular systolic function is normal. The right ventricular  size is normal. Tricuspid regurgitation signal is inadequate for assessing PA pressure.   3. The mitral valve is grossly normal. Trivial mitral valve  regurgitation.   4. The aortic valve is tricuspid. Aortic valve regurgitation is not  visualized.   5. The inferior vena cava is dilated in size with >50% respiratory  variability, suggesting right atrial pressure of 8 mmHg.   Comparison(s): Prior images unable to be directly viewed, comparison made by report only. No significant change from prior study. 10/23/2011: LVEF 55-60%.    Epic records are reviewed at length today  CHA2DS2-VASc Score = 2  The patient's score is based upon: CHF History: 0 HTN History: 1 Diabetes History: 1 Stroke History: 0 Vascular Disease History: 0 Age Score: 0 Gender Score: 0       ASSESSMENT AND PLAN: Paroxysmal Atrial Fibrillation/typical atrial flutter The patient's CHA2DS2-VASc score is 2, indicating a 2.2% annual risk of stroke.   Patient has been maintaining SR. He would like to try decreasing his medication now that he is on CPAP. Will decrease flecainide to 50 mg BID. If he still has not had any afib at his next visit,  will consider stopping altogether.  Continue diltiazem 120 mg BID Continue Eliquis 5 mg BID  Secondary Hypercoagulable State (ICD10:  D68.69) The patient is at significant risk for stroke/thromboembolism based upon his CHA2DS2-VASc Score of 2.  Continue Apixaban (Eliquis).   Obesity Body mass index is 34.29 kg/m.  Encouraged lifestyle modification  OSA  Severe  Encouraged nightly CPAP Followed by Dr Mayford Knife    Follow up in the AF clinic in 6 months.    Jorja Loa PA-C [facility] [address] [place], [place] [address] [phone] 02/23/2023 10:31 AM

## 2023-03-01 ENCOUNTER — Encounter (HOSPITAL_COMMUNITY): Payer: Self-pay | Admitting: Emergency Medicine

## 2023-03-01 ENCOUNTER — Other Ambulatory Visit: Payer: Self-pay | Admitting: Family

## 2023-03-01 ENCOUNTER — Ambulatory Visit (HOSPITAL_COMMUNITY)
Admission: EM | Admit: 2023-03-01 | Discharge: 2023-03-01 | Disposition: A | Discharge status: Home / Self Care | Payer: BC Managed Care – PPO | Attending: Physician Assistant | Admitting: Physician Assistant

## 2023-03-01 DIAGNOSIS — L0291 Cutaneous abscess, unspecified: Secondary | ICD-10-CM | POA: Diagnosis not present

## 2023-03-01 DIAGNOSIS — E785 Hyperlipidemia, unspecified: Secondary | ICD-10-CM

## 2023-03-01 MED ORDER — LIDOCAINE HCL (PF) 2 % IJ SOLN
INTRAMUSCULAR | Status: AC
  Filled 2023-03-01: qty 10

## 2023-03-01 MED ORDER — DOXYCYCLINE HYCLATE 100 MG PO CAPS: 100.0000 mg | ORAL_CAPSULE | Freq: Two times a day (BID) | ORAL | 0 refills | Status: DC

## 2023-03-01 MED ORDER — LIDOCAINE HCL 2 % IJ SOLN
10.0000 mL | Freq: Once | INTRAMUSCULAR | Status: AC
  Administered 2023-03-01: 200 mg

## 2023-03-01 NOTE — ED Provider Notes (Signed)
[facility]    CSN: [id number] Arrival date & time: 03/01/23  6295      History   Chief Complaint Chief Complaint  Patient presents with   Abscess   Insect Bite    HPI [name] [name] is a 57 y.o. male.   Pt complains of an abscess on his right inner thigh.  Pt has had for several days.  No fever no chills   The history is provided by the patient.  Abscess Location:  Leg Leg abscess location:  R upper leg Size:  2 Abscess quality: fluctuance   Red streaking: no     Past Medical History:  Diagnosis Date   ARF (acute renal failure) (HCC)    Atrial flutter (HCC)    Community acquired bacterial pneumonia    Diastolic CHF, chronic (HCC)    Leukocytosis    N&V (nausea and vomiting)    SOB (shortness of breath)    Substance abuse (HCC)    Prior hx of cocaine (quit 5 years ago)/EtOH (clean for 14 months) as of 10/2011    Patient Active Problem List   Diagnosis Date Noted   Hypercoagulable state due to paroxysmal atrial fibrillation (HCC) 02/23/2023   Sickle cell trait (HCC) 08/15/2022   Polycythemia, secondary 08/15/2022   RBC microcytosis 08/04/2022   OSA (obstructive sleep apnea) 07/13/2022   Herpes 07/08/2022   Hyperlipidemia 11/30/2020   Persistent atrial fibrillation (HCC)    Paroxysmal atrial fibrillation (HCC) 05/29/2019   Substance abuse (HCC)    Diastolic CHF, chronic    Diastolic CHF, chronic (HCC) 10/24/2011   Atrial flutter (HCC) 10/22/2011   Community acquired bacterial pneumonia 10/21/2011   SOB (shortness of breath) 10/21/2011   N&V (nausea and vomiting) 10/21/2011   ARF (acute renal failure) (HCC) 10/21/2011   Tobacco abuse 10/21/2011   Leukocytosis 10/21/2011   Fever 10/21/2011    Past Surgical History:  Procedure Laterality Date   CARDIOVERSION N/A 10/29/2020   Procedure: CARDIOVERSION;  Surgeon: Nahser, Deloris Ping, MD;  Location: [facility];  Service: Cardiovascular;  Laterality: N/A;   COLONOSCOPY         Home  Medications    Prior to Admission medications   Medication Sig Start Date End Date Taking? Authorizing Provider  doxycycline (VIBRAMYCIN) 100 MG capsule Take 1 capsule (100 mg total) by mouth 2 (two) times daily. 03/01/23  Yes Elson Areas, PA-C  acetaminophen (TYLENOL) 500 MG tablet Take 1,000 mg by mouth as needed for mild pain, moderate pain or headache.    [provider]  apixaban (ELIQUIS) 5 MG TABS tablet Take 1 tablet (5 mg total) by mouth 2 (two) times daily. 02/23/23   Fenton, Clint R, PA  atorvastatin (LIPITOR) 40 MG tablet Take 1 tablet (40 mg total) by mouth daily. 01/05/23   Rema Fendt, NP  dapagliflozin propanediol (FARXIGA) 10 MG TABS tablet Take 1 tablet (10 mg total) by mouth daily before breakfast. 01/05/23   Rema Fendt, NP  diltiazem (CARDIZEM CD) 120 MG 24 hr capsule Take 1 capsule (120 mg total) by mouth 2 (two) times daily. 02/23/23   Fenton, Clint R, PA  flecainide (TAMBOCOR) 50 MG tablet Take 1 tablet (50 mg total) by mouth 2 (two) times daily. 02/23/23   Fenton, Clint R, PA  methocarbamol (ROBAXIN) 500 MG tablet Take 1 tablet (500 mg total) by mouth 2 (two) times daily as needed for muscle spasms. 01/24/23   Gustavus Bryant, FNP  methylPREDNISolone (MEDROL DOSEPAK) 4  MG TBPK tablet Follow dose pack instructions 01/26/23   Fayrene Helper, MD  oxyCODONE-acetaminophen (PERCOCET/ROXICET) 5-325 MG tablet Take 1 tablet by mouth every 6 (six) hours as needed for severe pain. 01/26/23   Fayrene Helper, MD  sitaGLIPtin (JANUVIA) 50 MG tablet Take 1 tablet (50 mg total) by mouth daily. 01/05/23   Rema Fendt, NP  valACYclovir (VALTREX) 1000 MG tablet Take 1 tablet (1,000 mg total) by mouth 2 (two) times daily. 02/16/23   Rema Fendt, NP    Family History Family History  Problem Relation Age of Onset   Diabetes Sister    Heart disease Neg Hx    Colon cancer Neg Hx    Stomach cancer Neg Hx    Esophageal cancer Neg Hx     Social History Social History    Tobacco Use   Smoking status: Former    Types: Cigarettes    Passive exposure: Past   Smokeless tobacco: Never   Tobacco comments:    Former Smoker Quit April 2013 08/12/21  Vaping Use   Vaping status: Never Used  Substance Use Topics   Alcohol use: Not Currently   Drug use: Not Currently    Comment: "clean for long time"     Allergies   Metformin and related   Review of Systems Review of Systems  All other systems reviewed and are negative.    Physical Exam Triage Vital Signs ED Triage Vitals  Encounter Vitals Group     BP 03/01/23 1012 138/81     Systolic BP Percentile --      Diastolic BP Percentile --      Pulse Rate 03/01/23 1012 73     Resp 03/01/23 1012 17     Temp 03/01/23 1012 97.8 F (36.6 C)     Temp Source 03/01/23 1012 Oral     SpO2 03/01/23 1014 96 %     Weight --      Height --      Head Circumference --      Peak Flow --      Pain Score 03/01/23 1011 10     Pain Loc --      Pain Education --      Exclude from Growth Chart --    No data found.  Updated Vital Signs BP 138/81 (BP Location: Left Arm)   Pulse 73   Temp 97.8 F (36.6 C) (Oral)   Resp 17   SpO2 96%   Visual Acuity Right Eye Distance:   Left Eye Distance:   Bilateral Distance:    Right Eye Near:   Left Eye Near:    Bilateral Near:     Physical Exam Vitals and nursing note reviewed.  Constitutional:      General: He is not in acute distress.    Appearance: He is well-developed.  HENT:     Head: Normocephalic.  Cardiovascular:     Rate and Rhythm: Normal rate.  Pulmonary:     Effort: Pulmonary effort is normal. No respiratory distress.  Musculoskeletal:        General: No swelling. Normal range of motion.     Comments: 2cm abscess right groin   Skin:    General: Skin is warm.  Neurological:     General: No focal deficit present.     Mental Status: He is alert.      UC Treatments / Results  Labs (all labs ordered are listed, but only abnormal results  are displayed) Labs  Reviewed - No data to display  EKG   Radiology No results found.  Procedures Incision and Drainage  Date/Time: 03/01/2023 11:40 AM  Performed by: Elson Areas, PA-C Authorized by: Elson Areas, PA-C   Consent:    Consent obtained:  Verbal   Consent given by:  Patient   Risks, benefits, and alternatives were discussed: yes     Risks discussed:  Bleeding and incomplete drainage   Alternatives discussed:  No treatment Universal protocol:    Procedure explained and questions answered to patient or proxy's satisfaction: yes     Immediately prior to procedure, a time out was called: yes     Patient identity confirmed:  Verbally with patient Location:    Type:  Abscess   Size:  2   Location:  Lower extremity Pre-procedure details:    Skin preparation:  Povidone-iodine Sedation:    Sedation type:  None Anesthesia:    Anesthesia method:  Local infiltration   Local anesthetic:  Lidocaine 2% w/o epi Procedure type:    Complexity:  Simple Procedure details:    Incision types:  Single straight   Incision depth:  Submucosal   Wound management:  Probed and deloculated   Wound treatment:  Wound left open   Packing materials:  None Post-procedure details:    Procedure completion:  Tolerated  (including critical care time)  Medications Ordered in UC Medications  lidocaine (XYLOCAINE) 2 % (with pres) injection 200 mg (200 mg Infiltration Given by Other 03/01/23 1128)    Initial Impression / Assessment and Plan / UC Course  I have reviewed the triage vital signs and the nursing notes.  Pertinent labs & imaging results that were available during my care of the patient were reviewed by me and considered in my medical decision making (see chart for details).      Final Clinical Impressions(s) / UC Diagnoses   Final diagnoses:  Abscess     Discharge Instructions      Return if any problems.    ED Prescriptions     Medication Sig Dispense  Auth. Provider   doxycycline (VIBRAMYCIN) 100 MG capsule Take 1 capsule (100 mg total) by mouth 2 (two) times daily. 20 capsule Elson Areas, [place]      PDMP not reviewed this encounter. An After Visit Summary was printed and given to the patient.       Elson Areas, [place] 03/01/23 1142

## 2023-03-01 NOTE — Discharge Instructions (Addendum)
Return if any problems.

## 2023-03-01 NOTE — ED Triage Notes (Signed)
Pt has boil for week on medial right upper leg that is painful. Denies drainage.   Pt reports spider bit on left upper arm for a week.

## 2023-03-02 ENCOUNTER — Other Ambulatory Visit: Payer: Self-pay

## 2023-03-02 DIAGNOSIS — G4733 Obstructive sleep apnea (adult) (pediatric): Secondary | ICD-10-CM | POA: Diagnosis not present

## 2023-03-02 DIAGNOSIS — E785 Hyperlipidemia, unspecified: Secondary | ICD-10-CM

## 2023-03-02 MED ORDER — ATORVASTATIN CALCIUM 40 MG PO TABS
40.0000 mg | ORAL_TABLET | Freq: Every day | ORAL | 0 refills | Status: DC
Start: 2023-03-02 — End: 2023-04-27

## 2023-03-02 NOTE — Telephone Encounter (Signed)
Atorvastatin prescribed 03/02/2023 for 90 day supply.

## 2023-03-09 ENCOUNTER — Other Ambulatory Visit: Payer: Self-pay

## 2023-03-09 ENCOUNTER — Telehealth: Payer: Self-pay | Admitting: Family

## 2023-03-09 DIAGNOSIS — B009 Herpesviral infection, unspecified: Secondary | ICD-10-CM

## 2023-03-09 MED ORDER — VALACYCLOVIR HCL 1 G PO TABS
1000.0000 mg | ORAL_TABLET | Freq: Two times a day (BID) | ORAL | 2 refills | Status: DC
Start: 2023-03-09 — End: 2023-04-13

## 2023-03-09 NOTE — Telephone Encounter (Signed)
Pt came in person asking for refill for valACYclovir .

## 2023-03-09 NOTE — Telephone Encounter (Signed)
Refill sent in. 03/09/2023

## 2023-03-15 NOTE — Telephone Encounter (Signed)
See routing comment from provider for message information.

## 2023-03-28 ENCOUNTER — Telehealth: Payer: Self-pay | Admitting: Physician Assistant

## 2023-04-05 ENCOUNTER — Other Ambulatory Visit: Payer: Self-pay | Admitting: Physician Assistant

## 2023-04-05 DIAGNOSIS — D751 Secondary polycythemia: Secondary | ICD-10-CM

## 2023-04-06 ENCOUNTER — Other Ambulatory Visit: Payer: BC Managed Care – PPO

## 2023-04-06 ENCOUNTER — Inpatient Hospital Stay (HOSPITAL_BASED_OUTPATIENT_CLINIC_OR_DEPARTMENT_OTHER): Payer: BC Managed Care – PPO | Admitting: Physician Assistant

## 2023-04-06 ENCOUNTER — Ambulatory Visit: Payer: BC Managed Care – PPO | Admitting: Oncology

## 2023-04-06 ENCOUNTER — Inpatient Hospital Stay: Payer: BC Managed Care – PPO | Attending: Physician Assistant

## 2023-04-06 VITALS — BP 128/69 | HR 64 | Temp 98.1°F | Resp 17 | Wt 240.9 lb

## 2023-04-06 DIAGNOSIS — D573 Sickle-cell trait: Secondary | ICD-10-CM | POA: Insufficient documentation

## 2023-04-06 DIAGNOSIS — D751 Secondary polycythemia: Secondary | ICD-10-CM | POA: Diagnosis not present

## 2023-04-06 DIAGNOSIS — Z79899 Other long term (current) drug therapy: Secondary | ICD-10-CM | POA: Diagnosis not present

## 2023-04-06 DIAGNOSIS — Z87891 Personal history of nicotine dependence: Secondary | ICD-10-CM | POA: Insufficient documentation

## 2023-04-06 DIAGNOSIS — R718 Other abnormality of red blood cells: Secondary | ICD-10-CM

## 2023-04-06 LAB — CBC WITH DIFFERENTIAL (CANCER CENTER ONLY)
Abs Immature Granulocytes: 0.02 10*3/uL (ref 0.00–0.07)
Basophils Absolute: 0 10*3/uL (ref 0.0–0.1)
Basophils Relative: 1 %
Eosinophils Absolute: 0.2 10*3/uL (ref 0.0–0.5)
Eosinophils Relative: 4 %
HCT: 46 % (ref 39.0–52.0)
Hemoglobin: 14.7 g/dL (ref 13.0–17.0)
Immature Granulocytes: 0 %
Lymphocytes Relative: 40 %
Lymphs Abs: 2.3 10*3/uL (ref 0.7–4.0)
MCH: 22.2 pg — ABNORMAL LOW (ref 26.0–34.0)
MCHC: 32 g/dL (ref 30.0–36.0)
MCV: 69.5 fL — ABNORMAL LOW (ref 80.0–100.0)
Monocytes Absolute: 0.6 10*3/uL (ref 0.1–1.0)
Monocytes Relative: 10 %
Neutro Abs: 2.5 10*3/uL (ref 1.7–7.7)
Neutrophils Relative %: 45 %
Platelet Count: 222 10*3/uL (ref 150–400)
RBC: 6.62 MIL/uL — ABNORMAL HIGH (ref 4.22–5.81)
RDW: 17.6 % — ABNORMAL HIGH (ref 11.5–15.5)
WBC Count: 5.6 10*3/uL (ref 4.0–10.5)
nRBC: 0 % (ref 0.0–0.2)

## 2023-04-06 LAB — CMP (CANCER CENTER ONLY)
ALT: 20 U/L (ref 0–44)
AST: 16 U/L (ref 15–41)
Albumin: 4.2 g/dL (ref 3.5–5.0)
Alkaline Phosphatase: 103 U/L (ref 38–126)
Anion gap: 6 (ref 5–15)
BUN: 11 mg/dL (ref 6–20)
CO2: 24 mmol/L (ref 22–32)
Calcium: 9 mg/dL (ref 8.9–10.3)
Chloride: 109 mmol/L (ref 98–111)
Creatinine: 1.37 mg/dL — ABNORMAL HIGH (ref 0.61–1.24)
GFR, Estimated: >60 mL/min (ref >60)
Glucose, Bld: 108 mg/dL — ABNORMAL HIGH (ref 70–99)
Potassium: 4 mmol/L (ref 3.5–5.1)
Sodium: 139 mmol/L (ref 135–145)
Total Bilirubin: 0.4 mg/dL (ref 0.3–1.2)
Total Protein: 7.2 g/dL (ref 6.5–8.1)

## 2023-04-06 NOTE — Progress Notes (Signed)
[facility] [phone]   [phone]  PROGRESS NOTE  Patient Care Team: Rema Fendt, NP as PCP - General (Nurse Practitioner)  CHIEF COMPLAINTS/PURPOSE OF CONSULTATION:  Erythrocytosis   HISTORY OF PRESENTING ILLNESS:  [name] [name] 57 y.o. male returns for a follow up for erythrocytosis/polycythemia. He was last seen by Dr. Angelene Giovanni on 10/06/2022.  On exam today, [name] [name] reports he is doing well without any changes to his health. His energy levels are stable. He is compliant with using a CPAP machine.  He denies any nausea, vomiting or bowel habit changes. He denies easy bruising or signs of bleeding. He denies fevers, chills, sweats, shortness of breath, chest pain or cough. He has no other complaints. Rest of the ROS is below.   MEDICAL HISTORY:  Past Medical History:  Diagnosis Date   ARF (acute renal failure) (HCC)    Atrial flutter (HCC)    Community acquired bacterial pneumonia    Diastolic CHF, chronic (HCC)    Leukocytosis    N&V (nausea and vomiting)    SOB (shortness of breath)    Substance abuse (HCC)    Prior hx of cocaine (quit 5 years ago)/EtOH (clean for 14 months) as of 10/2011    SURGICAL HISTORY: Past Surgical History:  Procedure Laterality Date   CARDIOVERSION N/A 10/29/2020   Procedure: CARDIOVERSION;  Surgeon: Nahser, Deloris Ping, MD;  Location: [facility] ENDOSCOPY;  Service: Cardiovascular;  Laterality: N/A;   COLONOSCOPY      SOCIAL HISTORY: Social History   Socioeconomic History   Marital status: Married    Spouse name: Not on file   Number of children: Not on file   Years of education: Not on file   Highest education level: Not on file  Occupational History   Occupation: forklift driver  Tobacco Use   Smoking status: Former    Types: Cigarettes    Passive exposure: Past   Smokeless tobacco: Never   Tobacco comments:    Former Smoker Quit April 2013 08/12/21  Vaping Use   Vaping status: Never Used   Substance and Sexual Activity   Alcohol use: Not Currently   Drug use: Not Currently    Comment: "clean for long time"   Sexual activity: Not on file  Other Topics Concern   Not on file  Social History Narrative   Not on file   Social Determinants of Health   Financial Resource Strain: Medium Risk (08/14/2022)   Overall Financial Resource Strain (CARDIA)    Difficulty of Paying Living Expenses: Somewhat hard  Food Insecurity: No Food Insecurity (08/04/2022)   Hunger Vital Sign    Worried About Running Out of Food in the Last Year: Never true    Ran Out of Food in the Last Year: Never true  Transportation Needs: No Transportation Needs (08/04/2022)   PRAPARE - Administrator, Civil Service (Medical): No    Lack of Transportation (Non-Medical): No  Physical Activity: Not on file  Stress: Not on file  Social Connections: Not on file  Intimate Partner Violence: Not At Risk (08/04/2022)   Humiliation, Afraid, Rape, and Kick questionnaire    Fear of Current or Ex-Partner: No    Emotionally Abused: No    Physically Abused: No    Sexually Abused: No    FAMILY HISTORY: Family History  Problem Relation Age of Onset   Diabetes Sister    Heart disease Neg Hx    Colon cancer Neg Hx  Stomach cancer Neg Hx    Esophageal cancer Neg Hx     ALLERGIES:  is allergic to metformin and related.  MEDICATIONS:  Current Outpatient Medications  Medication Sig Dispense Refill   acetaminophen (TYLENOL) 500 MG tablet Take 1,000 mg by mouth as needed for mild pain, moderate pain or headache.     apixaban (ELIQUIS) 5 MG TABS tablet Take 1 tablet (5 mg total) by mouth 2 (two) times daily. 180 tablet 3   atorvastatin (LIPITOR) 40 MG tablet Take 1 tablet (40 mg total) by mouth daily. 90 tablet 0   dapagliflozin propanediol (FARXIGA) 10 MG TABS tablet Take 1 tablet (10 mg total) by mouth daily before breakfast. 90 tablet 0   diltiazem (CARDIZEM CD) 120 MG 24 hr capsule Take 1 capsule (120  mg total) by mouth 2 (two) times daily. 180 capsule 3   flecainide (TAMBOCOR) 50 MG tablet Take 1 tablet (50 mg total) by mouth 2 (two) times daily. 180 tablet 1   methocarbamol (ROBAXIN) 500 MG tablet Take 1 tablet (500 mg total) by mouth 2 (two) times daily as needed for muscle spasms. 20 tablet 0   methylPREDNISolone (MEDROL DOSEPAK) 4 MG TBPK tablet Follow dose pack instructions 21 tablet 0   oxyCODONE-acetaminophen (PERCOCET/ROXICET) 5-325 MG tablet Take 1 tablet by mouth every 6 (six) hours as needed for severe pain. 12 tablet 0   sitaGLIPtin (JANUVIA) 50 MG tablet Take 1 tablet (50 mg total) by mouth daily. 90 tablet 0   valACYclovir (VALTREX) 1000 MG tablet Take 1 tablet (1,000 mg total) by mouth 2 (two) times daily. 20 tablet 2   doxycycline (VIBRAMYCIN) 100 MG capsule Take 1 capsule (100 mg total) by mouth 2 (two) times daily. (Patient not taking: Reported on 04/06/2023) 20 capsule 0   No current facility-administered medications for this visit.    REVIEW OF SYSTEMS:   Constitutional: ( - ) fevers, ( - )  chills , ( - ) night sweats Eyes: ( - ) blurriness of vision, ( - ) double vision, ( - ) watery eyes Ears, nose, mouth, throat, and face: ( - ) mucositis, ( - ) sore throat Respiratory: ( - ) cough, ( - ) dyspnea, ( - ) wheezes Cardiovascular: ( - ) palpitation, ( - ) chest discomfort, ( - ) lower extremity swelling Gastrointestinal:  ( - ) nausea, ( - ) heartburn, ( - ) change in bowel habits Skin: ( - ) abnormal skin rashes Lymphatics: ( - ) new lymphadenopathy, ( - ) easy bruising Neurological: ( - ) numbness, ( - ) tingling, ( - ) new weaknesses Behavioral/Psych: ( - ) mood change, ( - ) new changes  All other systems were reviewed with the patient and are negative.  PHYSICAL EXAMINATION: ECOG PERFORMANCE STATUS: 0 - Asymptomatic  Vitals:   04/06/23 0824  BP: 128/69  Pulse: 64  Resp: 17  Temp: 98.1 F (36.7 C)  SpO2: 97%   Filed Weights   04/06/23 0824  Weight:  240 lb 14.4 oz (109.3 kg)    GENERAL: well appearing male in NAD  SKIN: skin color, texture, turgor are normal, no rashes or significant lesions EYES: conjunctiva are pink and non-injected, sclera clear LUNGS: clear to auscultation and percussion with normal breathing effort HEART: regular rate & rhythm and no murmurs and no lower extremity edema Musculoskeletal: no cyanosis of digits and no clubbing  PSYCH: alert & oriented x 3, fluent speech NEURO: no focal motor/sensory deficits  LABORATORY DATA:  I have reviewed the data as listed    Latest Ref Rng & Units 04/06/2023    8:03 AM 01/26/2023   12:50 PM 10/06/2022    9:41 AM  CBC  WBC 4.0 - 10.5 K/uL 5.6  6.0  5.5   Hemoglobin 13.0 - 17.0 g/dL 40.9  81.1  91.4   Hematocrit 39.0 - 52.0 % 46.0  45.1  45.6   Platelets 150 - 400 K/uL 222  228  240        Latest Ref Rng & Units 04/06/2023    8:03 AM 01/26/2023   12:50 PM 01/05/2023    9:28 AM  CMP  Glucose 70 - 99 mg/dL 782  84  99   BUN 6 - 20 mg/dL 11  10  13    Creatinine 0.61 - 1.24 mg/dL 9.56  2.13  0.86   Sodium 135 - 145 mmol/L 139  137  141   Potassium 3.5 - 5.1 mmol/L 4.0  4.2  4.8   Chloride 98 - 111 mmol/L 109  107  103   CO2 22 - 32 mmol/L 24  21  22    Calcium 8.9 - 10.3 mg/dL 9.0  8.8  9.6   Total Protein 6.5 - 8.1 g/dL 7.2     Total Bilirubin 0.3 - 1.2 mg/dL 0.4     Alkaline Phos 38 - 126 U/L 103     AST 15 - 41 U/L 16     ALT 0 - 44 U/L 20      ASSESSMENT & PLAN [name] [name] is a 57 y.o. male who returns for a follow up for erythrocytosis.   #Erythrocytosis: --Likely secondary to OSA currently managed with CPAP and sickle cell trait that was confirmed Hgb solubility on 08/04/2022,  --Labs today show stable RBC of 6.62, MCV 69.5. Hgb and Hct levels normal.  --No further workup is required at this time. Okay to follow up with PCP with repeat CBC every 6-12 months.  --Consider follow up in our clinic if Hgb is 18 g/dL  Case was discussed with Dr. Leonides Schanz.    No orders of the defined types were placed in this encounter.   All questions were answered. The patient knows to call the clinic with any problems, questions or concerns.  I have spent a total of 25 minutes minutes of face-to-face and non-face-to-face time, preparing to see the patient, performing a medically appropriate examination, counseling and educating the patient, documenting clinical information in the electronic health record, independently interpreting results and communicating results to the patient, and care coordination.   Georga Kaufmann, PA-C Department of Hematology/Oncology [facility] Phone: [phone]

## 2023-04-09 ENCOUNTER — Encounter: Payer: Self-pay | Admitting: Family

## 2023-04-13 ENCOUNTER — Telehealth: Payer: Self-pay | Admitting: Family

## 2023-04-13 ENCOUNTER — Ambulatory Visit: Payer: BC Managed Care – PPO | Admitting: Family

## 2023-04-13 ENCOUNTER — Other Ambulatory Visit: Payer: Self-pay | Admitting: *Deleted

## 2023-04-13 DIAGNOSIS — B009 Herpesviral infection, unspecified: Secondary | ICD-10-CM

## 2023-04-13 MED ORDER — VALACYCLOVIR HCL 1 G PO TABS
1000.0000 mg | ORAL_TABLET | Freq: Two times a day (BID) | ORAL | 2 refills | Status: DC
Start: 2023-04-13 — End: 2023-04-27

## 2023-04-13 NOTE — Telephone Encounter (Signed)
Pt is requesting valACYclovir (VALTREX) refill. Appt scheduled on 10/18

## 2023-04-26 ENCOUNTER — Other Ambulatory Visit: Payer: Self-pay | Admitting: Family

## 2023-04-26 DIAGNOSIS — E1165 Type 2 diabetes mellitus with hyperglycemia: Secondary | ICD-10-CM

## 2023-04-27 ENCOUNTER — Telehealth: Payer: Self-pay | Admitting: Family

## 2023-04-27 ENCOUNTER — Encounter: Payer: Self-pay | Admitting: Family

## 2023-04-27 ENCOUNTER — Ambulatory Visit (INDEPENDENT_AMBULATORY_CARE_PROVIDER_SITE_OTHER): Payer: BC Managed Care – PPO | Admitting: Family

## 2023-04-27 ENCOUNTER — Other Ambulatory Visit: Payer: Self-pay | Admitting: Family

## 2023-04-27 VITALS — BP 128/76 | HR 71 | Temp 97.8°F | Ht 70.0 in | Wt 237.2 lb

## 2023-04-27 DIAGNOSIS — Z7984 Long term (current) use of oral hypoglycemic drugs: Secondary | ICD-10-CM

## 2023-04-27 DIAGNOSIS — B009 Herpesviral infection, unspecified: Secondary | ICD-10-CM

## 2023-04-27 DIAGNOSIS — E1165 Type 2 diabetes mellitus with hyperglycemia: Secondary | ICD-10-CM

## 2023-04-27 DIAGNOSIS — E785 Hyperlipidemia, unspecified: Secondary | ICD-10-CM

## 2023-04-27 DIAGNOSIS — Z23 Encounter for immunization: Secondary | ICD-10-CM

## 2023-04-27 LAB — POCT GLYCOSYLATED HEMOGLOBIN (HGB A1C): HbA1c, POC (controlled diabetic range): 7 % (ref 0.0–7.0)

## 2023-04-27 MED ORDER — SITAGLIPTIN PHOSPHATE 50 MG PO TABS: 50.0000 mg | ORAL_TABLET | Freq: Every day | ORAL | 0 refills | Status: DC

## 2023-04-27 MED ORDER — ATORVASTATIN CALCIUM 40 MG PO TABS: 40.0000 mg | ORAL_TABLET | Freq: Every day | ORAL | 0 refills | Status: DC

## 2023-04-27 MED ORDER — DEXCOM G6 TRANSMITTER MISC: 1.0000 | Freq: Three times a day (TID) | 2 refills | Status: DC

## 2023-04-27 MED ORDER — DEXCOM G6 SENSOR MISC: 1.0000 | Freq: Three times a day (TID) | 2 refills | Status: DC

## 2023-04-27 MED ORDER — DAPAGLIFLOZIN PROPANEDIOL 10 MG PO TABS
10.0000 mg | ORAL_TABLET | Freq: Every day | ORAL | 0 refills | Status: DC
Start: 2023-04-27 — End: 2023-07-27

## 2023-04-27 MED ORDER — VALACYCLOVIR HCL 1 G PO TABS: 1000.0000 mg | ORAL_TABLET | Freq: Two times a day (BID) | ORAL | 2 refills | Status: DC

## 2023-04-27 MED ORDER — DEXCOM G6 RECEIVER DEVI: 1.0000 | Freq: Three times a day (TID) | 2 refills | Status: DC

## 2023-04-27 NOTE — Telephone Encounter (Signed)
Requested Prescriptions  Pending Prescriptions Disp Refills   dapagliflozin propanediol (FARXIGA) 10 MG TABS tablet [Pharmacy Med Name: Farxiga 10 MG Oral Tablet] 90 tablet 0    Sig: TAKE 1 TABLET BY MOUTH DAILY  BEFORE BREAKFAST     Endocrinology:  Diabetes - SGLT2 Inhibitors Failed - 04/26/2023 10:10 PM      Failed - Cr in normal range and within 360 days    Creatinine  Date Value Ref Range Status  04/06/2023 1.37 (H) 0.61 - 1.24 mg/dL Final   Creatinine, Urine  Date Value Ref Range Status  10/21/2011 261.0 mg/dL Final         Passed - HBA1C is between 0 and 7.9 and within 180 days    HbA1c, POC (controlled diabetic range)  Date Value Ref Range Status  01/05/2023 7.1 (A) 0.0 - 7.0 % Final         Passed - eGFR in normal range and within 360 days    GFR calc Af Amer  Date Value Ref Range Status  01/14/2019 >60 >60 mL/min Final   GFR, Estimated  Date Value Ref Range Status  04/06/2023 >60 >60 mL/min Final    Comment:    (NOTE) Calculated using the CKD-EPI Creatinine Equation (2021)    eGFR  Date Value Ref Range Status  01/05/2023 73 >59 mL/min/1.73 Final         Passed - Valid encounter within last 6 months    Recent Outpatient Visits           3 months ago Type 2 diabetes mellitus with hyperglycemia, without long-term current use of insulin (HCC)   [facility], Amy J, NP   5 months ago Type 2 diabetes mellitus without complication, without long-term current use of insulin (HCC)   [place] [facility], Amy J, NP   6 months ago Type 2 diabetes mellitus without complication, without long-term current use of insulin (HCC)   [facility], [place], NP   9 months ago Encounter to discuss test results   [facility], Amy J, NP   9 months ago Annual physical exam   [facility], Salomon Fick, NP        Future Appointments             Today Rema Fendt, NP [facility]

## 2023-04-27 NOTE — Telephone Encounter (Signed)
Complete

## 2023-04-27 NOTE — Telephone Encounter (Signed)
Pt is requesting DEXCOM

## 2023-04-27 NOTE — Progress Notes (Signed)
Pt states no concerns to discuss.  Patient wants Flu vaccine.

## 2023-04-27 NOTE — Progress Notes (Signed)
Patient ID: [name] [name], male    DOB: [birth year]  MRN: [id number]  CC: Chronic Conditions Management  Subjective: [name] [name] is a 57 y.o. male who presents for chronic conditions management.  His concerns today include:  - Doing well on Sitagliptin and Dapagliflozin Propanediol, no issues/concerns. He denies red flag symptoms associated with diabetes.  - Doing well on Atorvastatin, no issues/concerns. - Needs refills of Valacyclovir.  Patient Active Problem List   Diagnosis Date Noted   Hypercoagulable state due to paroxysmal atrial fibrillation (HCC) 02/23/2023   Sickle cell trait (HCC) 08/15/2022   Polycythemia, secondary 08/15/2022   RBC microcytosis 08/04/2022   OSA (obstructive sleep apnea) 07/13/2022   Herpes 07/08/2022   Hyperlipidemia 11/30/2020   Persistent atrial fibrillation (HCC)    Paroxysmal atrial fibrillation (HCC) 05/29/2019   Substance abuse (HCC)    Diastolic CHF, chronic    Diastolic CHF, chronic (HCC) 10/24/2011   Atrial flutter (HCC) 10/22/2011   Community acquired bacterial pneumonia 10/21/2011   SOB (shortness of breath) 10/21/2011   N&V (nausea and vomiting) 10/21/2011   ARF (acute renal failure) (HCC) 10/21/2011   Tobacco abuse 10/21/2011   Leukocytosis 10/21/2011   Fever 10/21/2011     Current Outpatient Medications on File Prior to Visit  Medication Sig Dispense Refill   acetaminophen (TYLENOL) 500 MG tablet Take 1,000 mg by mouth as needed for mild pain, moderate pain or headache.     apixaban (ELIQUIS) 5 MG TABS tablet Take 1 tablet (5 mg total) by mouth 2 (two) times daily. 180 tablet 3   diltiazem (CARDIZEM CD) 120 MG 24 hr capsule Take 1 capsule (120 mg total) by mouth 2 (two) times daily. 180 capsule 3   flecainide (TAMBOCOR) 50 MG tablet Take 1 tablet (50 mg total) by mouth 2 (two) times daily. 180 tablet 1   methocarbamol (ROBAXIN) 500 MG tablet Take 1 tablet (500 mg total) by mouth 2 (two) times daily as needed for muscle  spasms. 20 tablet 0   oxyCODONE-acetaminophen (PERCOCET/ROXICET) 5-325 MG tablet Take 1 tablet by mouth every 6 (six) hours as needed for severe pain. 12 tablet 0   methylPREDNISolone (MEDROL DOSEPAK) 4 MG TBPK tablet Follow dose pack instructions (Patient not taking: Reported on 04/27/2023) 21 tablet 0   No current facility-administered medications on file prior to visit.    Allergies  Allergen Reactions   Metformin And Related Other (See Comments)    Pt endorses blurred vision with metformin use.     Social History   Socioeconomic History   Marital status: Married    Spouse name: Not on file   Number of children: Not on file   Years of education: Not on file   Highest education level: Not on file  Occupational History   Occupation: forklift driver  Tobacco Use   Smoking status: Former    Types: Cigarettes    Passive exposure: Past   Smokeless tobacco: Never   Tobacco comments:    Former Smoker Quit April 2013 08/12/21  Vaping Use   Vaping status: Never Used  Substance and Sexual Activity   Alcohol use: Not Currently   Drug use: Not Currently    Comment: "clean for long time"   Sexual activity: Not on file  Other Topics Concern   Not on file  Social History Narrative   Not on file   Social Determinants of Health   Financial Resource Strain: Medium Risk (08/14/2022)   Overall Financial Resource Strain (CARDIA)  Difficulty of Paying Living Expenses: Somewhat hard  Food Insecurity: No Food Insecurity (08/04/2022)   Hunger Vital Sign    Worried About Running Out of Food in the Last Year: Never true    Ran Out of Food in the Last Year: Never true  Transportation Needs: No Transportation Needs (08/04/2022)   PRAPARE - Administrator, Civil Service (Medical): No    Lack of Transportation (Non-Medical): No  Physical Activity: Not on file  Stress: Not on file  Social Connections: Not on file  Intimate Partner Violence: Not At Risk (08/04/2022)   Humiliation,  Afraid, Rape, and Kick questionnaire    Fear of Current or Ex-Partner: No    Emotionally Abused: No    Physically Abused: No    Sexually Abused: No    Family History  Problem Relation Age of Onset   Diabetes Sister    Heart disease Neg Hx    Colon cancer Neg Hx    Stomach cancer Neg Hx    Esophageal cancer Neg Hx     Past Surgical History:  Procedure Laterality Date   CARDIOVERSION N/A 10/29/2020   Procedure: CARDIOVERSION;  Surgeon: Nahser, Deloris Ping, MD;  Location: [facility];  Service: Cardiovascular;  Laterality: N/A;   COLONOSCOPY      ROS: Review of Systems Negative except as stated above  PHYSICAL EXAM: BP 128/76   Pulse 71   Temp 97.8 F (36.6 C) (Oral)   Ht 5\' 10"  (1.778 m)   Wt 237 lb 3.2 oz (107.6 kg)   SpO2 94%   BMI 34.03 kg/m   Physical Exam HENT:     Head: Normocephalic and atraumatic.     Nose: Nose normal.     Mouth/Throat:     Mouth: Mucous membranes are moist.     Pharynx: Oropharynx is clear.  Eyes:     Extraocular Movements: Extraocular movements intact.     Conjunctiva/sclera: Conjunctivae normal.     Pupils: Pupils are equal, round, and reactive to light.  Cardiovascular:     Rate and Rhythm: Normal rate and regular rhythm.     Pulses: Normal pulses.     Heart sounds: Normal heart sounds.  Pulmonary:     Effort: Pulmonary effort is normal.     Breath sounds: Normal breath sounds.  Musculoskeletal:        General: Normal range of motion.     Cervical back: Normal range of motion and neck supple.  Neurological:     General: No focal deficit present.     Mental Status: He is alert and oriented to person, place, and time.  Psychiatric:        Mood and Affect: Mood normal.        Behavior: Behavior normal.     ASSESSMENT AND PLAN: 1. Type 2 diabetes mellitus with hyperglycemia, without long-term current use of insulin (HCC) - Continue Dapagliflozin Propanediol and Sitagliptin as prescribed. Counseled on medication  adherence/adverse effects.  - Routine screening.  - Discussed the importance of healthy eating habits, low-carbohydrate diet, low-sugar diet, regular aerobic exercise (at least 150 minutes a week as tolerated) and medication compliance to achieve or maintain control of diabetes. - Follow-up with primary provider as scheduled. - dapagliflozin propanediol (FARXIGA) 10 MG TABS tablet; Take 1 tablet (10 mg total) by mouth daily before breakfast.  Dispense: 90 tablet; Refill: 0 - sitaGLIPtin (JANUVIA) 50 MG tablet; Take 1 tablet (50 mg total) by mouth daily.  Dispense: 90 tablet; Refill:  0 - POCT glycosylated hemoglobin (Hb A1C); Future  2. Hyperlipidemia, unspecified hyperlipidemia type - Continue Atorvastatin as prescribed. Counseled on medication adherence/adverse effects.  - Follow-up with primary provider in 3 months or sooner if needed.  - atorvastatin (LIPITOR) 40 MG tablet; Take 1 tablet (40 mg total) by mouth daily.  Dispense: 90 tablet; Refill: 0  3. Herpes - Continue Valacyclovir as prescribed. Counseled on medication adherence/adverse effects.  - Follow-up with primary provider as scheduled. - valACYclovir (VALTREX) 1000 MG tablet; Take 1 tablet (1,000 mg total) by mouth 2 (two) times daily.  Dispense: 20 tablet; Refill: 2  4. Encounter for immunization - Administered. - Flu vaccine trivalent PF, 6mos and older(Flulaval,Afluria,Fluarix,Fluzone)    Patient was given the opportunity to ask questions.  Patient verbalized understanding of the plan and was able to repeat key elements of the plan. Patient was given clear instructions to go to Emergency Department or return to medical center if symptoms don't improve, worsen, or new problems develop.The patient verbalized understanding.   Orders Placed This Encounter  Procedures   Flu vaccine trivalent PF, 6mos and older(Flulaval,Afluria,Fluarix,Fluzone)   POCT glycosylated hemoglobin (Hb A1C)     Requested Prescriptions   Signed  Prescriptions Disp Refills   dapagliflozin propanediol (FARXIGA) 10 MG TABS tablet 90 tablet 0    Sig: Take 1 tablet (10 mg total) by mouth daily before breakfast.   sitaGLIPtin (JANUVIA) 50 MG tablet 90 tablet 0    Sig: Take 1 tablet (50 mg total) by mouth daily.   valACYclovir (VALTREX) 1000 MG tablet 20 tablet 2    Sig: Take 1 tablet (1,000 mg total) by mouth 2 (two) times daily.   atorvastatin (LIPITOR) 40 MG tablet 90 tablet 0    Sig: Take 1 tablet (40 mg total) by mouth daily.    Return in about 3 months (around 07/28/2023) for Follow-Up or next available chronic conditions.  Rema Fendt, NP

## 2023-05-08 ENCOUNTER — Other Ambulatory Visit: Payer: Self-pay | Admitting: Family

## 2023-05-08 DIAGNOSIS — E785 Hyperlipidemia, unspecified: Secondary | ICD-10-CM

## 2023-05-18 ENCOUNTER — Other Ambulatory Visit (HOSPITAL_COMMUNITY): Payer: Self-pay | Admitting: *Deleted

## 2023-05-18 MED ORDER — APIXABAN 5 MG PO TABS: 5.0000 mg | ORAL_TABLET | Freq: Two times a day (BID) | ORAL | 3 refills | Status: DC

## 2023-05-18 MED ORDER — DILTIAZEM HCL ER COATED BEADS 120 MG PO CP24: 120.0000 mg | ORAL_CAPSULE | Freq: Two times a day (BID) | ORAL | 3 refills | Status: DC

## 2023-06-04 ENCOUNTER — Telehealth: Payer: Self-pay | Admitting: Family

## 2023-06-04 NOTE — Telephone Encounter (Signed)
Patient called in needing refill for Valtrex , I called pharmacy since he has another refill for it and asked them to refill it and give him a call when its ready for pick up patient stated he would call after his break.

## 2023-06-15 ENCOUNTER — Telehealth: Payer: Self-pay | Admitting: Family

## 2023-06-15 ENCOUNTER — Other Ambulatory Visit: Payer: Self-pay | Admitting: Family

## 2023-06-15 DIAGNOSIS — B009 Herpesviral infection, unspecified: Secondary | ICD-10-CM

## 2023-06-15 MED ORDER — VALACYCLOVIR HCL 1 G PO TABS: 1000.0000 mg | ORAL_TABLET | Freq: Two times a day (BID) | ORAL | 2 refills | Status: DC

## 2023-06-15 NOTE — Telephone Encounter (Signed)
Patient needs refill request on Valtrex sent to [facility] on [address]

## 2023-06-15 NOTE — Telephone Encounter (Signed)
Complete

## 2023-06-20 ENCOUNTER — Encounter: Payer: Self-pay | Admitting: Family

## 2023-07-26 ENCOUNTER — Other Ambulatory Visit (HOSPITAL_COMMUNITY): Payer: Self-pay | Admitting: *Deleted

## 2023-07-26 ENCOUNTER — Other Ambulatory Visit: Payer: Self-pay | Admitting: Family

## 2023-07-26 ENCOUNTER — Other Ambulatory Visit: Payer: Self-pay

## 2023-07-26 DIAGNOSIS — E1165 Type 2 diabetes mellitus with hyperglycemia: Secondary | ICD-10-CM

## 2023-07-26 DIAGNOSIS — B009 Herpesviral infection, unspecified: Secondary | ICD-10-CM

## 2023-07-26 MED ORDER — DILTIAZEM HCL ER COATED BEADS 120 MG PO CP24: 120.0000 mg | ORAL_CAPSULE | Freq: Two times a day (BID) | ORAL | 3 refills | Status: DC

## 2023-07-26 MED ORDER — APIXABAN 5 MG PO TABS: 5.0000 mg | ORAL_TABLET | Freq: Two times a day (BID) | ORAL | 3 refills | Status: DC

## 2023-07-26 MED ORDER — VALACYCLOVIR HCL 1 G PO TABS: 1000.0000 mg | ORAL_TABLET | Freq: Two times a day (BID) | ORAL | 2 refills | Status: DC

## 2023-07-27 ENCOUNTER — Ambulatory Visit (INDEPENDENT_AMBULATORY_CARE_PROVIDER_SITE_OTHER): Payer: BC Managed Care – PPO | Admitting: Family

## 2023-07-27 ENCOUNTER — Encounter: Payer: Self-pay | Admitting: Family

## 2023-07-27 VITALS — BP 130/78 | HR 73 | Temp 97.4°F | Ht 70.0 in | Wt 243.0 lb

## 2023-07-27 DIAGNOSIS — Z5986 Financial insecurity: Secondary | ICD-10-CM | POA: Diagnosis not present

## 2023-07-27 DIAGNOSIS — E1165 Type 2 diabetes mellitus with hyperglycemia: Secondary | ICD-10-CM

## 2023-07-27 DIAGNOSIS — Z7984 Long term (current) use of oral hypoglycemic drugs: Secondary | ICD-10-CM | POA: Diagnosis not present

## 2023-07-27 DIAGNOSIS — E785 Hyperlipidemia, unspecified: Secondary | ICD-10-CM

## 2023-07-27 LAB — POCT GLYCOSYLATED HEMOGLOBIN (HGB A1C): HbA1c, POC (controlled diabetic range): 7 % (ref 0.0–7.0)

## 2023-07-27 MED ORDER — SITAGLIPTIN PHOSPHATE 50 MG PO TABS
50.0000 mg | ORAL_TABLET | Freq: Every day | ORAL | 0 refills | Status: DC
Start: 2023-07-27 — End: 2023-08-14

## 2023-07-27 MED ORDER — ATORVASTATIN CALCIUM 40 MG PO TABS
40.0000 mg | ORAL_TABLET | Freq: Every day | ORAL | 0 refills | Status: DC
Start: 2023-07-27 — End: 2023-10-19

## 2023-07-27 MED ORDER — DAPAGLIFLOZIN PROPANEDIOL 10 MG PO TABS: 10.0000 mg | ORAL_TABLET | Freq: Every day | ORAL | 0 refills | Status: DC

## 2023-07-27 NOTE — Progress Notes (Signed)
Patient ID: [name] [name], male    DOB: 1965/09/14  MRN: [id number]  CC: Chronic Conditions Follow-Up  Subjective: [name] [name] is a 58 y.o. male who presents for chronic conditions follow-up.   His concerns today include:  - Doing well on Sitagliptin and Dapagliflozin Propanediol, no issues/concerns. Denies red flag symptoms associated with diabetes.  - Doing well on Atorvastatin, no issues/concerns.   Patient Active Problem List   Diagnosis Date Noted   Hypercoagulable state due to paroxysmal atrial fibrillation (HCC) 02/23/2023   Sickle cell trait (HCC) 08/15/2022   Polycythemia, secondary 08/15/2022   RBC microcytosis 08/04/2022   OSA (obstructive sleep apnea) 07/13/2022   Herpes 07/08/2022   Hyperlipidemia 11/30/2020   Persistent atrial fibrillation (HCC)    Paroxysmal atrial fibrillation (HCC) 05/29/2019   Substance abuse (HCC)    Diastolic CHF, chronic    Diastolic CHF, chronic (HCC) 10/24/2011   Atrial flutter (HCC) 10/22/2011   Community acquired bacterial pneumonia 10/21/2011   SOB (shortness of breath) 10/21/2011   N&V (nausea and vomiting) 10/21/2011   ARF (acute renal failure) (HCC) 10/21/2011   Tobacco abuse 10/21/2011   Leukocytosis 10/21/2011   Fever 10/21/2011     Current Outpatient Medications on File Prior to Visit  Medication Sig Dispense Refill   acetaminophen (TYLENOL) 500 MG tablet Take 1,000 mg by mouth as needed for mild pain, moderate pain or headache.     apixaban (ELIQUIS) 5 MG TABS tablet Take 1 tablet (5 mg total) by mouth 2 (two) times daily. 180 tablet 3   diltiazem (CARDIZEM CD) 120 MG 24 hr capsule Take 1 capsule (120 mg total) by mouth 2 (two) times daily. 180 capsule 3   flecainide (TAMBOCOR) 50 MG tablet Take 1 tablet (50 mg total) by mouth 2 (two) times daily. 180 tablet 1   oxyCODONE-acetaminophen (PERCOCET/ROXICET) 5-325 MG tablet Take 1 tablet by mouth every 6 (six) hours as needed for severe pain. 12 tablet 0   valACYclovir  (VALTREX) 1000 MG tablet Take 1 tablet (1,000 mg total) by mouth 2 (two) times daily. 20 tablet 2   Continuous Glucose Receiver (DEXCOM G6 RECEIVER) DEVI 1 each by Does not apply route 4 (four) times daily -  before meals and at bedtime. (Patient not taking: Reported on 07/27/2023) 1 each 2   Continuous Glucose Sensor (DEXCOM G6 SENSOR) MISC 1 each by Does not apply route 4 (four) times daily -  before meals and at bedtime. (Patient not taking: Reported on 07/27/2023) 3 each 2   Continuous Glucose Transmitter (DEXCOM G6 TRANSMITTER) MISC 1 each by Does not apply route 4 (four) times daily -  before meals and at bedtime. (Patient not taking: Reported on 07/27/2023) 1 each 2   methocarbamol (ROBAXIN) 500 MG tablet Take 1 tablet (500 mg total) by mouth 2 (two) times daily as needed for muscle spasms. (Patient not taking: Reported on 07/27/2023) 20 tablet 0   methylPREDNISolone (MEDROL DOSEPAK) 4 MG TBPK tablet Follow dose pack instructions (Patient not taking: Reported on 07/27/2023) 21 tablet 0   No current facility-administered medications on file prior to visit.    Allergies  Allergen Reactions   Metformin And Related Other (See Comments)    Pt endorses blurred vision with metformin use.     Social History   Socioeconomic History   Marital status: Married    Spouse name: Not on file   Number of children: Not on file   Years of education: Not on file   Highest  education level: Not on file  Occupational History   Occupation: forklift driver  Tobacco Use   Smoking status: Former    Types: Cigarettes    Passive exposure: Past   Smokeless tobacco: Never   Tobacco comments:    Former Smoker Quit April 2013 08/12/21  Vaping Use   Vaping status: Never Used  Substance and Sexual Activity   Alcohol use: Not Currently   Drug use: Not Currently    Comment: "clean for long time"   Sexual activity: Not on file  Other Topics Concern   Not on file  Social History Narrative   Not on file    Social Drivers of Health   Financial Resource Strain: Medium Risk (08/14/2022)   Overall Financial Resource Strain (CARDIA)    Difficulty of Paying Living Expenses: Somewhat hard  Food Insecurity: No Food Insecurity (08/04/2022)   Hunger Vital Sign    Worried About Running Out of Food in the Last Year: Never true    Ran Out of Food in the Last Year: Never true  Transportation Needs: No Transportation Needs (08/04/2022)   PRAPARE - Administrator, Civil Service (Medical): No    Lack of Transportation (Non-Medical): No  Physical Activity: Not on file  Stress: Not on file  Social Connections: Not on file  Intimate Partner Violence: Not At Risk (08/04/2022)   Humiliation, Afraid, Rape, and Kick questionnaire    Fear of Current or Ex-Partner: No    Emotionally Abused: No    Physically Abused: No    Sexually Abused: No    Family History  Problem Relation Age of Onset   Diabetes Sister    Heart disease Neg Hx    Colon cancer Neg Hx    Stomach cancer Neg Hx    Esophageal cancer Neg Hx     Past Surgical History:  Procedure Laterality Date   CARDIOVERSION N/A 10/29/2020   Procedure: CARDIOVERSION;  Surgeon: Nahser, Deloris Ping, MD;  Location: [facility];  Service: Cardiovascular;  Laterality: N/A;   COLONOSCOPY      ROS: Review of Systems Negative except as stated above  PHYSICAL EXAM: BP 130/78   Pulse 73   Temp (!) 97.4 F (36.3 C) (Oral)   Ht 5\' 10"  (1.778 m)   Wt 243 lb (110.2 kg)   SpO2 93%   BMI 34.87 kg/m   Physical Exam HENT:     Head: Normocephalic and atraumatic.     Nose: Nose normal.     Mouth/Throat:     Mouth: Mucous membranes are moist.     Pharynx: Oropharynx is clear.  Eyes:     Extraocular Movements: Extraocular movements intact.     Conjunctiva/sclera: Conjunctivae normal.     Pupils: Pupils are equal, round, and reactive to light.  Cardiovascular:     Rate and Rhythm: Normal rate and regular rhythm.     Pulses: Normal pulses.      Heart sounds: Normal heart sounds.  Pulmonary:     Effort: Pulmonary effort is normal.     Breath sounds: Normal breath sounds.  Musculoskeletal:        General: Normal range of motion.     Cervical back: Normal range of motion and neck supple.  Neurological:     General: No focal deficit present.     Mental Status: He is alert and oriented to person, place, and time.  Psychiatric:        Mood and Affect: Mood normal.  Behavior: Behavior normal.     Results for orders placed or performed in visit on 07/27/23  POCT glycosylated hemoglobin (Hb A1C)  Result Value Ref Range   Hemoglobin A1C     HbA1c POC (<> result, manual entry)     HbA1c, POC (prediabetic range)     HbA1c, POC (controlled diabetic range) 7.0 0.0 - 7.0 %     ASSESSMENT AND PLAN: 1. Type 2 diabetes mellitus with hyperglycemia, without long-term current use of insulin (HCC) (Primary) - Hemoglobin A1c at goal at 7.0%, goal 7%. - Continue Sitagliptin and Dapagliflozin Propanediol as prescribed. Counseled on medication adherence/adverse effects.  - Routine screening.  - Discussed the importance of healthy eating habits, low-carbohydrate diet, low-sugar diet, regular aerobic exercise (at least 150 minutes a week as tolerated) and medication compliance to achieve or maintain control of diabetes. - Follow-up with primary provider in 3 months or sooner if needed.  - POCT glycosylated hemoglobin (Hb A1C) - Microalbumin / creatinine urine ratio - Basic Metabolic Panel - dapagliflozin propanediol (FARXIGA) 10 MG TABS tablet; Take 1 tablet (10 mg total) by mouth daily before breakfast.  Dispense: 90 tablet; Refill: 0 - sitaGLIPtin (JANUVIA) 50 MG tablet; Take 1 tablet (50 mg total) by mouth daily.  Dispense: 90 tablet; Refill: 0  2. Hyperlipidemia, unspecified hyperlipidemia type - Continue Atorvastatin as prescribed. Counseled on medication adherence/adverse effects.  - Routine screening.  - Follow-up with primary  provider as scheduled.  - atorvastatin (LIPITOR) 40 MG tablet; Take 1 tablet (40 mg total) by mouth daily.  Dispense: 90 tablet; Refill: 0 - Lipid panel   Patient was given the opportunity to ask questions.  Patient verbalized understanding of the plan and was able to repeat key elements of the plan. Patient was given clear instructions to go to Emergency Department or return to medical center if symptoms don't improve, worsen, or new problems develop.The patient verbalized understanding.   Orders Placed This Encounter  Procedures   Microalbumin / creatinine urine ratio   Basic Metabolic Panel   Lipid panel   POCT glycosylated hemoglobin (Hb A1C)     Requested Prescriptions   Signed Prescriptions Disp Refills   dapagliflozin propanediol (FARXIGA) 10 MG TABS tablet 90 tablet 0    Sig: Take 1 tablet (10 mg total) by mouth daily before breakfast.   sitaGLIPtin (JANUVIA) 50 MG tablet 90 tablet 0    Sig: Take 1 tablet (50 mg total) by mouth daily.   atorvastatin (LIPITOR) 40 MG tablet 90 tablet 0    Sig: Take 1 tablet (40 mg total) by mouth daily.    Return in about 3 months (around 10/25/2023) for Follow-Up or next available chronic conditions.  Rema Fendt, NP

## 2023-07-27 NOTE — Progress Notes (Signed)
Patient states his insurance wont pay for Dexcom.

## 2023-07-28 LAB — BASIC METABOLIC PANEL
BUN/Creatinine Ratio: 9 (ref 9–20)
BUN: 11 mg/dL (ref 6–24)
CO2: 20 mmol/L (ref 20–29)
Calcium: 9.3 mg/dL (ref 8.7–10.2)
Chloride: 106 mmol/L (ref 96–106)
Creatinine, Ser: 1.19 mg/dL (ref 0.76–1.27)
Glucose: 93 mg/dL (ref 70–99)
Potassium: 4.8 mmol/L (ref 3.5–5.2)
Sodium: 142 mmol/L (ref 134–144)
eGFR: 72 mL/min/{1.73_m2} (ref >59)

## 2023-07-28 LAB — LIPID PANEL
Chol/HDL Ratio: 3.5 {ratio} (ref 0.0–5.0)
Cholesterol, Total: 159 mg/dL (ref 100–199)
HDL: 45 mg/dL (ref >39)
LDL Chol Calc (NIH): 94 mg/dL (ref 0–99)
Triglycerides: 108 mg/dL (ref 0–149)
VLDL Cholesterol Cal: 20 mg/dL (ref 5–40)

## 2023-07-28 LAB — MICROALBUMIN / CREATININE URINE RATIO
Creatinine, Urine: 142.8 mg/dL
Microalb/Creat Ratio: 27 mg/g{creat} (ref 0–29)
Microalbumin, Urine: 38.3 ug/mL

## 2023-07-30 ENCOUNTER — Encounter: Payer: Self-pay | Admitting: Family

## 2023-07-31 ENCOUNTER — Ambulatory Visit: Payer: BC Managed Care – PPO | Admitting: Family

## 2023-08-03 ENCOUNTER — Other Ambulatory Visit (HOSPITAL_COMMUNITY): Payer: Self-pay | Admitting: Physician Assistant

## 2023-08-03 ENCOUNTER — Ambulatory Visit: Payer: BC Managed Care – PPO | Admitting: Family

## 2023-08-03 DIAGNOSIS — E119 Type 2 diabetes mellitus without complications: Secondary | ICD-10-CM | POA: Diagnosis not present

## 2023-08-03 DIAGNOSIS — H43823 Vitreomacular adhesion, bilateral: Secondary | ICD-10-CM | POA: Diagnosis not present

## 2023-08-14 ENCOUNTER — Telehealth: Payer: Self-pay | Admitting: Family

## 2023-08-14 ENCOUNTER — Other Ambulatory Visit: Payer: Self-pay | Admitting: Family

## 2023-08-14 DIAGNOSIS — E1165 Type 2 diabetes mellitus with hyperglycemia: Secondary | ICD-10-CM

## 2023-08-14 MED ORDER — DAPAGLIFLOZIN PROPANEDIOL 10 MG PO TABS: 10.0000 mg | ORAL_TABLET | Freq: Every day | ORAL | 0 refills | Status: DC

## 2023-08-14 MED ORDER — SITAGLIPTIN PHOSPHATE 50 MG PO TABS: 50.0000 mg | ORAL_TABLET | Freq: Every day | ORAL | 0 refills | Status: DC

## 2023-08-14 NOTE — Telephone Encounter (Signed)
 Complete

## 2023-08-14 NOTE — Telephone Encounter (Signed)
Pt is requesting refill to be sent to pharmacy, [facility] home delivery. Meds requested are farxiga and [place]

## 2023-08-30 NOTE — Progress Notes (Signed)
 Primary Care Physician: Rema Fendt, NP Primary Cardiologist: Dr Delton See (remotely) Primary Electrophysiologist: none Referring Physician: [facility]   [name] [name] is a 58 y.o. male with a history of paroxysmal afib, atrial flutter, diastolic CHF, OSA, DM, and tobacco use. He was initially diagnosed with afib in 2013 in the setting of CAP. He spontaneously converted back to SR. Unfortunately, patient tested positive for COVID-19 and was found to be in atrial flutter and later in afib at the ER. He converted to SR after diltiazem drip. He was asymptomatic during the episode. He denies any alcohol use.   Patient has had multiple ED visits for various reasons. He presented to the ED on 10/07/20 for cough and sore throat and was incidentally found to be in rapid atrial flutter. He underwent DCCV at that time. He does admit to taking psuedophedrine before the onset of his symptoms. He was back in rapid flutter on follow up and started on flecainide. Patient is s/p DCCV 10/29/20.   Patient returns for follow up for atrial fibrillation and flecainide monitoring. He reports that he has done very well since his last visit. He has not had any symptoms of afib despite decreasing flecainide. No bleeding issues on anticoagulation.   Today, he denies symptoms of palpitations, chest pain, shortness of breath, orthopnea, PND, lower extremity edema, dizziness, presyncope, syncope, bleeding, or neurologic sequela. The patient is tolerating medications without difficulties and is otherwise without complaint today.    Atrial Fibrillation Risk Factors:  he does have symptoms or diagnosis of sleep apnea. he does have a history of alcohol use. Does not drink currently.   Atrial Fibrillation Management history:  Previous antiarrhythmic drugs: flecainide  Previous cardioversions: 10/29/20 Previous ablations: none Anticoagulation history: Eliquis   Past Medical History:  Diagnosis Date   ARF (acute  renal failure) (HCC)    Atrial flutter (HCC)    Community acquired bacterial pneumonia    Diastolic CHF, chronic (HCC)    Leukocytosis    N&V (nausea and vomiting)    SOB (shortness of breath)    Substance abuse (HCC)    Prior hx of cocaine (quit 5 years ago)/EtOH (clean for 14 months) as of 10/2011    Current Outpatient Medications  Medication Sig Dispense Refill   acetaminophen (TYLENOL) 500 MG tablet Take 1,000 mg by mouth as needed for mild pain, moderate pain or headache.     apixaban (ELIQUIS) 5 MG TABS tablet Take 1 tablet (5 mg total) by mouth 2 (two) times daily. 180 tablet 3   atorvastatin (LIPITOR) 40 MG tablet Take 1 tablet (40 mg total) by mouth daily. 90 tablet 0   dapagliflozin propanediol (FARXIGA) 10 MG TABS tablet Take 1 tablet (10 mg total) by mouth daily before breakfast. 90 tablet 0   diltiazem (CARDIZEM CD) 120 MG 24 hr capsule Take 1 capsule (120 mg total) by mouth 2 (two) times daily. 180 capsule 3   flecainide (TAMBOCOR) 50 MG tablet Take 1 tablet (50 mg total) by mouth 2 (two) times daily. 180 tablet 1   flecainide (TAMBOCOR) 50 MG tablet Take 1 tablet (50 mg total) by mouth 2 (two) times daily. 60 tablet 3   sitaGLIPtin (JANUVIA) 50 MG tablet Take 1 tablet (50 mg total) by mouth daily. 90 tablet 0   valACYclovir (VALTREX) 1000 MG tablet Take 1 tablet (1,000 mg total) by mouth 2 (two) times daily. 20 tablet 2   No current facility-administered medications for this encounter.  ROS- All systems are reviewed and negative except as per the HPI above.  Physical Exam: Vitals:   08/31/23 1017  BP: 138/68  Pulse: 76  Weight: 110.6 kg  Height: 5\' 10"  (1.778 m)    GEN: Well nourished, well developed in no acute distress CARDIAC: Regular rate and rhythm, no murmurs, rubs, gallops RESPIRATORY:  Clear to auscultation without rales, wheezing or rhonchi  ABDOMEN: Soft, non-tender, non-distended EXTREMITIES:  No edema; No deformity    Wt Readings from Last 3  Encounters:  08/31/23 110.6 kg  07/27/23 110.2 kg  04/27/23 107.6 kg    EKG today demonstrates  SR Vent. rate 76 BPM PR interval 170 ms QRS duration 92 ms QT/QTcB 360/405 ms   Echo 11/19/20 demonstrated   1. Left ventricular ejection fraction, by estimation, is 55 to 60%. The  left ventricle has normal function. The left ventricle demonstrates global hypokinesis. Left ventricular diastolic parameters were normal.   2. Right ventricular systolic function is normal. The right ventricular  size is normal. Tricuspid regurgitation signal is inadequate for assessing PA pressure.   3. The mitral valve is grossly normal. Trivial mitral valve  regurgitation.   4. The aortic valve is tricuspid. Aortic valve regurgitation is not  visualized.   5. The inferior vena cava is dilated in size with >50% respiratory  variability, suggesting right atrial pressure of 8 mmHg.   Comparison(s): Prior images unable to be directly viewed, comparison made by report only. No significant change from prior study. 10/23/2011: LVEF 55-60%.    Epic records are reviewed at length today  CHA2DS2-VASc Score = 2  The patient's score is based upon: CHF History: 0 HTN History: 1 Diabetes History: 1 Stroke History: 0 Vascular Disease History: 0 Age Score: 0 Gender Score: 0       ASSESSMENT AND PLAN: Paroxysmal Atrial Fibrillation/typical atrial flutter The patient's CHA2DS2-VASc score is 2, indicating a 2.2% annual risk of stroke.   Patient appears to be maintaining SR. After discussing the risks and benefits, will trial off flecainide. If he has recurrent afib, he may be a good candidate for ablation.  Continue diltiazem 120 mg BID Continue Eliquis 5 mg BID  Secondary Hypercoagulable State (ICD10:  D68.69) The patient is at significant risk for stroke/thromboembolism based upon his CHA2DS2-VASc Score of 2.  Continue Apixaban (Eliquis). No bleeding issues  High Risk Medication Monitoring (ICD 10:  Z79.899) Stop flecainide as above.    Obesity Body mass index is 34.98 kg/m.  Encouraged lifestyle modification  OSA  Severe OSA Encouraged nightly CPAP Followed by Dr Mayford Knife    Follow up with Dr Mayford Knife per recall. AF clinic in 6 months.    Jorja Loa PA-C [facility] [address] [place], [place] [address] [phone] 08/31/2023 10:29 AM

## 2023-08-31 ENCOUNTER — Encounter (HOSPITAL_COMMUNITY): Payer: Self-pay | Admitting: Physician Assistant

## 2023-08-31 ENCOUNTER — Ambulatory Visit (HOSPITAL_COMMUNITY)
Admission: RE | Admit: 2023-08-31 | Discharge: 2023-08-31 | Disposition: A | Discharge status: Home / Self Care | Payer: BC Managed Care – PPO | Source: Ambulatory Visit | Attending: Physician Assistant | Admitting: Physician Assistant

## 2023-08-31 ENCOUNTER — Telehealth: Payer: Self-pay | Admitting: Family

## 2023-08-31 ENCOUNTER — Other Ambulatory Visit: Payer: Self-pay | Admitting: Family

## 2023-08-31 VITALS — BP 138/68 | HR 76 | Ht 70.0 in | Wt 243.8 lb

## 2023-08-31 DIAGNOSIS — Z7901 Long term (current) use of anticoagulants: Secondary | ICD-10-CM | POA: Insufficient documentation

## 2023-08-31 DIAGNOSIS — D6869 Other thrombophilia: Secondary | ICD-10-CM | POA: Diagnosis not present

## 2023-08-31 DIAGNOSIS — Z5181 Encounter for therapeutic drug level monitoring: Secondary | ICD-10-CM | POA: Insufficient documentation

## 2023-08-31 DIAGNOSIS — Z6834 Body mass index (BMI) 34.0-34.9, adult: Secondary | ICD-10-CM | POA: Insufficient documentation

## 2023-08-31 DIAGNOSIS — Z79899 Other long term (current) drug therapy: Secondary | ICD-10-CM | POA: Diagnosis not present

## 2023-08-31 DIAGNOSIS — I483 Typical atrial flutter: Secondary | ICD-10-CM | POA: Insufficient documentation

## 2023-08-31 DIAGNOSIS — G4733 Obstructive sleep apnea (adult) (pediatric): Secondary | ICD-10-CM | POA: Diagnosis not present

## 2023-08-31 DIAGNOSIS — I5032 Chronic diastolic (congestive) heart failure: Secondary | ICD-10-CM | POA: Insufficient documentation

## 2023-08-31 DIAGNOSIS — I480 Paroxysmal atrial fibrillation: Secondary | ICD-10-CM | POA: Diagnosis not present

## 2023-08-31 DIAGNOSIS — E1165 Type 2 diabetes mellitus with hyperglycemia: Secondary | ICD-10-CM

## 2023-08-31 DIAGNOSIS — E669 Obesity, unspecified: Secondary | ICD-10-CM | POA: Insufficient documentation

## 2023-08-31 MED ORDER — APIXABAN 5 MG PO TABS: 5.0000 mg | ORAL_TABLET | Freq: Two times a day (BID) | ORAL | 6 refills | Status: DC

## 2023-08-31 MED ORDER — SITAGLIPTIN PHOSPHATE 50 MG PO TABS: 50.0000 mg | ORAL_TABLET | Freq: Every day | ORAL | 0 refills | Status: DC

## 2023-08-31 MED ORDER — DAPAGLIFLOZIN PROPANEDIOL 10 MG PO TABS: 10.0000 mg | ORAL_TABLET | Freq: Every day | ORAL | 0 refills | Status: DC

## 2023-08-31 MED ORDER — DILTIAZEM HCL ER COATED BEADS 120 MG PO CP24: 120.0000 mg | ORAL_CAPSULE | Freq: Two times a day (BID) | ORAL | 6 refills | Status: AC

## 2023-08-31 NOTE — Telephone Encounter (Signed)
 Patient needs his medication Farxiga and Januvia to [facility] on [address] it was sent to home delivery and patient isnt using the service anymore

## 2023-08-31 NOTE — Telephone Encounter (Signed)
 Complete

## 2023-09-05 ENCOUNTER — Other Ambulatory Visit: Payer: Self-pay | Admitting: Family

## 2023-09-05 ENCOUNTER — Telehealth: Payer: Self-pay | Admitting: Family

## 2023-09-05 DIAGNOSIS — E1165 Type 2 diabetes mellitus with hyperglycemia: Secondary | ICD-10-CM

## 2023-09-05 MED ORDER — GLIMEPIRIDE 1 MG PO TABS
1.0000 mg | ORAL_TABLET | Freq: Every day | ORAL | 0 refills | Status: DC
Start: 2023-09-05 — End: 2023-10-19

## 2023-09-05 NOTE — Telephone Encounter (Signed)
 Thank you :)

## 2023-09-17 ENCOUNTER — Other Ambulatory Visit: Payer: Self-pay | Admitting: Family

## 2023-09-17 DIAGNOSIS — B009 Herpesviral infection, unspecified: Secondary | ICD-10-CM

## 2023-09-20 NOTE — Telephone Encounter (Signed)
 Valacyclovir prescribed 09/17/2023.

## 2023-09-21 ENCOUNTER — Telehealth: Payer: Self-pay | Admitting: Family

## 2023-09-21 NOTE — Telephone Encounter (Signed)
 Pt is requesting refill for Valtrex. Please advise

## 2023-09-24 ENCOUNTER — Other Ambulatory Visit: Payer: Self-pay | Admitting: Family

## 2023-09-24 DIAGNOSIS — B009 Herpesviral infection, unspecified: Secondary | ICD-10-CM

## 2023-09-24 MED ORDER — VALACYCLOVIR HCL 1 G PO TABS: 1000.0000 mg | ORAL_TABLET | Freq: Two times a day (BID) | ORAL | 2 refills | Status: DC

## 2023-09-24 NOTE — Telephone Encounter (Signed)
 Complete

## 2023-09-24 NOTE — Telephone Encounter (Signed)
 Informed patient the medication was sent in.

## 2023-10-18 ENCOUNTER — Telehealth: Payer: Self-pay | Admitting: Family

## 2023-10-18 ENCOUNTER — Other Ambulatory Visit: Payer: Self-pay | Admitting: Family

## 2023-10-18 DIAGNOSIS — B009 Herpesviral infection, unspecified: Secondary | ICD-10-CM

## 2023-10-18 NOTE — Telephone Encounter (Signed)
 Complete

## 2023-10-18 NOTE — Telephone Encounter (Signed)
 Patient needs refil for Valtrex to be sent to the pharmacy

## 2023-10-19 ENCOUNTER — Other Ambulatory Visit: Payer: Self-pay | Admitting: Family

## 2023-10-19 ENCOUNTER — Encounter: Payer: Self-pay | Admitting: Family

## 2023-10-19 ENCOUNTER — Ambulatory Visit (INDEPENDENT_AMBULATORY_CARE_PROVIDER_SITE_OTHER): Payer: BC Managed Care – PPO | Admitting: Family

## 2023-10-19 VITALS — BP 132/76 | HR 77 | Temp 98.7°F | Resp 18 | Ht 70.0 in | Wt 241.8 lb

## 2023-10-19 DIAGNOSIS — E1165 Type 2 diabetes mellitus with hyperglycemia: Secondary | ICD-10-CM

## 2023-10-19 DIAGNOSIS — E785 Hyperlipidemia, unspecified: Secondary | ICD-10-CM | POA: Diagnosis not present

## 2023-10-19 DIAGNOSIS — Z7984 Long term (current) use of oral hypoglycemic drugs: Secondary | ICD-10-CM

## 2023-10-19 DIAGNOSIS — D367 Benign neoplasm of other specified sites: Secondary | ICD-10-CM

## 2023-10-19 LAB — POCT GLYCOSYLATED HEMOGLOBIN (HGB A1C): Hemoglobin A1C: 7.1 % — AB (ref 4.0–5.6)

## 2023-10-19 MED ORDER — SITAGLIPTIN PHOSPHATE 50 MG PO TABS: 50.0000 mg | ORAL_TABLET | Freq: Every day | ORAL | 0 refills | Status: DC

## 2023-10-19 MED ORDER — GLIMEPIRIDE 1 MG PO TABS: 1.0000 mg | ORAL_TABLET | Freq: Two times a day (BID) | ORAL | 0 refills | Status: DC

## 2023-10-19 MED ORDER — ATORVASTATIN CALCIUM 40 MG PO TABS: 40.0000 mg | ORAL_TABLET | Freq: Every day | ORAL | 0 refills | Status: DC

## 2023-10-19 MED ORDER — SITAGLIPTIN PHOSPHATE 100 MG PO TABS: 100.0000 mg | ORAL_TABLET | Freq: Every day | ORAL | 0 refills | Status: DC

## 2023-10-19 MED ORDER — DAPAGLIFLOZIN PROPANEDIOL 10 MG PO TABS: 10.0000 mg | ORAL_TABLET | Freq: Every day | ORAL | 0 refills | Status: DC

## 2023-10-19 NOTE — Progress Notes (Signed)
 Patient ID: [name] [name], male    DOB: May 13, 1966  MRN: [id number]  CC: Chronic Conditions Follow-Up  Subjective: [name] [name] is a 58 y.o. male who presents for chronic conditions follow-up.   His concerns today include:  - Doing well on Sitagliptin and Dapagliflozin Propanediol, no issues/concerns. Denies red flag symptoms associated with diabetes.  - Doing well on Atorvastatin, no issues/concerns.  - States he was seen in the Emergency Department for irrigation and debridement of cyst of left thigh. Reports since then has improved. However, states cyst of the left leg returns frequently.  Patient Active Problem List   Diagnosis Date Noted   Hypercoagulable state due to paroxysmal atrial fibrillation (HCC) 02/23/2023   Sickle cell trait (HCC) 08/15/2022   Polycythemia, secondary 08/15/2022   RBC microcytosis 08/04/2022   OSA (obstructive sleep apnea) 07/13/2022   Herpes 07/08/2022   Hyperlipidemia 11/30/2020   Persistent atrial fibrillation (HCC)    Paroxysmal atrial fibrillation (HCC) 05/29/2019   Substance abuse (HCC)    Diastolic CHF, chronic    Diastolic CHF, chronic (HCC) 10/24/2011   Atrial flutter (HCC) 10/22/2011   Community acquired bacterial pneumonia 10/21/2011   SOB (shortness of breath) 10/21/2011   N&V (nausea and vomiting) 10/21/2011   ARF (acute renal failure) (HCC) 10/21/2011   Tobacco abuse 10/21/2011   Leukocytosis 10/21/2011   Fever 10/21/2011     Current Outpatient Medications on File Prior to Visit  Medication Sig Dispense Refill   acetaminophen (TYLENOL) 500 MG tablet Take 1,000 mg by mouth as needed for mild pain, moderate pain or headache.     apixaban (ELIQUIS) 5 MG TABS tablet Take 1 tablet (5 mg total) by mouth 2 (two) times daily. 60 tablet 6   diltiazem (CARDIZEM CD) 120 MG 24 hr capsule Take 1 capsule (120 mg total) by mouth 2 (two) times daily. 60 capsule 6   glimepiride (AMARYL) 1 MG tablet Take 1 tablet (1 mg total) by mouth daily  with breakfast. 90 tablet 0   valACYclovir (VALTREX) 1000 MG tablet Take 1 tablet by mouth twice daily 20 tablet 2   No current facility-administered medications on file prior to visit.    Allergies  Allergen Reactions   Metformin And Related Other (See Comments)    Pt endorses blurred vision with metformin use.     Social History   Socioeconomic History   Marital status: Married    Spouse name: Not on file   Number of children: Not on file   Years of education: Not on file   Highest education level: Not on file  Occupational History   Occupation: forklift driver  Tobacco Use   Smoking status: Former    Types: Cigarettes    Passive exposure: Past   Smokeless tobacco: Never   Tobacco comments:    Former Smoker Quit April 2013 08/12/21  Vaping Use   Vaping status: Never Used  Substance and Sexual Activity   Alcohol use: Not Currently   Drug use: Not Currently    Comment: "clean for long time"   Sexual activity: Not on file  Other Topics Concern   Not on file  Social History Narrative   Not on file   Social Drivers of Health   Financial Resource Strain: Medium Risk (08/14/2022)   Overall Financial Resource Strain (CARDIA)    Difficulty of Paying Living Expenses: Somewhat hard  Food Insecurity: No Food Insecurity (08/04/2022)   Hunger Vital Sign    Worried About Running Out of Food  in the Last Year: Never true    Ran Out of Food in the Last Year: Never true  Transportation Needs: No Transportation Needs (08/04/2022)   PRAPARE - Administrator, Civil Service (Medical): No    Lack of Transportation (Non-Medical): No  Physical Activity: Inactive (10/19/2023)   Exercise Vital Sign    Days of Exercise per Week: 0 days    Minutes of Exercise per Session: 0 min  Stress: No Stress Concern Present (10/19/2023)   [employer] of Occupational Health - Occupational Stress Questionnaire    Feeling of Stress : Only a little  Social Connections: Moderately Isolated  (10/19/2023)   Social Connection and Isolation Panel [NHANES]    Frequency of Communication with Friends and Family: More than three times a week    Frequency of Social Gatherings with Friends and Family: More than three times a week    Attends Religious Services: Never    Database administrator or Organizations: No    Attends Banker Meetings: Never    Marital Status: Married  Catering manager Violence: Not At Risk (08/04/2022)   Humiliation, Afraid, Rape, and Kick questionnaire    Fear of Current or Ex-Partner: No    Emotionally Abused: No    Physically Abused: No    Sexually Abused: No    Family History  Problem Relation Age of Onset   Diabetes Sister    Heart disease Neg Hx    Colon cancer Neg Hx    Stomach cancer Neg Hx    Esophageal cancer Neg Hx     Past Surgical History:  Procedure Laterality Date   CARDIOVERSION N/A 10/29/2020   Procedure: CARDIOVERSION;  Surgeon: Nahser, Deloris Ping, MD;  Location: [facility];  Service: Cardiovascular;  Laterality: N/A;   COLONOSCOPY      ROS: Review of Systems Negative except as stated above  PHYSICAL EXAM: BP 132/76   Pulse 77   Temp 98.7 F (37.1 C) (Oral)   Resp 18   Ht 5\' 10"  (1.778 m)   Wt 241 lb 12.8 oz (109.7 kg)   SpO2 93%   BMI 34.69 kg/m   Physical Exam HENT:     Head: Normocephalic and atraumatic.     Nose: Nose normal.     Mouth/Throat:     Mouth: Mucous membranes are moist.     Pharynx: Oropharynx is clear.  Eyes:     Extraocular Movements: Extraocular movements intact.     Conjunctiva/sclera: Conjunctivae normal.     Pupils: Pupils are equal, round, and reactive to light.  Cardiovascular:     Rate and Rhythm: Normal rate and regular rhythm.     Pulses: Normal pulses.     Heart sounds: Normal heart sounds.  Pulmonary:     Effort: Pulmonary effort is normal.     Breath sounds: Normal breath sounds.  Musculoskeletal:        General: Normal range of motion.     Cervical back: Normal  range of motion and neck supple.  Neurological:     General: No focal deficit present.     Mental Status: He is alert and oriented to person, place, and time.  Psychiatric:        Mood and Affect: Mood normal.        Behavior: Behavior normal.    ASSESSMENT AND PLAN: 1. Type 2 diabetes mellitus with hyperglycemia, without long-term current use of insulin (HCC) (Primary) - Continue Dapagliflozin Propanediol and Sitagliptin as  prescribed.  - Hemoglobin A1c result pending.  - Discussed the importance of healthy eating habits, low-carbohydrate diet, low-sugar diet, regular aerobic exercise (at least 150 minutes a week as tolerated) and medication compliance to achieve or maintain control of diabetes. Counseled on medication adherence/adverse effects. - Follow-up with primary provider as scheduled. - sitaGLIPtin (JANUVIA) 50 MG tablet; Take 1 tablet (50 mg total) by mouth daily.  Dispense: 90 tablet; Refill: 0 - dapagliflozin propanediol (FARXIGA) 10 MG TABS tablet; Take 1 tablet (10 mg total) by mouth daily before breakfast.  Dispense: 90 tablet; Refill: 0 - POCT glycosylated hemoglobin (Hb A1C); Future  2. Hyperlipidemia, unspecified hyperlipidemia type - Continue Atorvastatin as prescribed. Counseled on medication adherence/adverse effects.  - Follow-up with primary provider in 3 months or sooner if needed. - atorvastatin (LIPITOR) 40 MG tablet; Take 1 tablet (40 mg total) by mouth daily.  Dispense: 90 tablet; Refill: 0  3. Dermoid cyst of leg, left - Referral to Dermatology for evaluation/management. - Ambulatory referral to Dermatology    Patient was given the opportunity to ask questions.  Patient verbalized understanding of the plan and was able to repeat key elements of the plan. Patient was given clear instructions to go to Emergency Department or return to medical center if symptoms don't improve, worsen, or new problems develop.The patient verbalized understanding.   Orders  Placed This Encounter  Procedures   Ambulatory referral to Dermatology   POCT glycosylated hemoglobin (Hb A1C)     Requested Prescriptions   Signed Prescriptions Disp Refills   sitaGLIPtin (JANUVIA) 50 MG tablet 90 tablet 0    Sig: Take 1 tablet (50 mg total) by mouth daily.   dapagliflozin propanediol (FARXIGA) 10 MG TABS tablet 90 tablet 0    Sig: Take 1 tablet (10 mg total) by mouth daily before breakfast.   atorvastatin (LIPITOR) 40 MG tablet 90 tablet 0    Sig: Take 1 tablet (40 mg total) by mouth daily.    Return in about 3 months (around 01/18/2024) for Follow-Up or next available chronic conditions.  Rema Fendt, NP

## 2023-10-19 NOTE — Telephone Encounter (Signed)
 I called patient and made him aware his  Valacyclovir was  prescribed 10/18/2023.

## 2023-10-19 NOTE — Telephone Encounter (Signed)
 Valacyclovir prescribed 10/18/2023.

## 2023-10-19 NOTE — Progress Notes (Signed)
 3 month follow up, sinus are bothering patient , thigh pain

## 2023-11-16 ENCOUNTER — Encounter (HOSPITAL_COMMUNITY): Payer: Self-pay

## 2023-11-23 ENCOUNTER — Encounter: Payer: Self-pay | Admitting: Family

## 2023-11-23 ENCOUNTER — Ambulatory Visit (INDEPENDENT_AMBULATORY_CARE_PROVIDER_SITE_OTHER): Admitting: Family

## 2023-11-23 VITALS — BP 144/81 | HR 82 | Temp 98.6°F | Resp 18 | Ht 70.0 in | Wt 246.2 lb

## 2023-11-23 DIAGNOSIS — B009 Herpesviral infection, unspecified: Secondary | ICD-10-CM | POA: Diagnosis not present

## 2023-11-23 DIAGNOSIS — E1165 Type 2 diabetes mellitus with hyperglycemia: Secondary | ICD-10-CM | POA: Diagnosis not present

## 2023-11-23 DIAGNOSIS — Z7984 Long term (current) use of oral hypoglycemic drugs: Secondary | ICD-10-CM

## 2023-11-23 MED ORDER — VALACYCLOVIR HCL 1 G PO TABS
1000.0000 mg | ORAL_TABLET | Freq: Two times a day (BID) | ORAL | 2 refills | Status: DC
Start: 2023-11-23 — End: 2024-01-02

## 2023-11-23 NOTE — Progress Notes (Signed)
 Patient ID: [name] [name], male    DOB: 1966/06/29  MRN: [id number]  CC: Chronic Conditions Follow-Up  Subjective: [name] [name] is a 58 y.o. male who presents for chronic conditions follow-up.   His concerns today include:  - Doing well on Glimepiride  and Dapagliflozin  Propanediol, no issues/concerns. Denies red flag symptoms associated with diabetes. States he does not want A1c checked today because he needs to make improvements in his diet. - Needs refills of Valacyclovir , no issues/concerns. - Established with Cardiology. States he ate bacon before today's office visit. He does not complain of red flag symptoms such as but not limited to chest pain, shortness of breath, worst headache of life, nausea/vomiting.    Patient Active Problem List   Diagnosis Date Noted   Hypercoagulable state due to paroxysmal atrial fibrillation (HCC) 02/23/2023   Sickle cell trait (HCC) 08/15/2022   Polycythemia, secondary 08/15/2022   RBC microcytosis 08/04/2022   OSA (obstructive sleep apnea) 07/13/2022   Herpes 07/08/2022   Hyperlipidemia 11/30/2020   Persistent atrial fibrillation (HCC)    Paroxysmal atrial fibrillation (HCC) 05/29/2019   Substance abuse (HCC)    Diastolic CHF, chronic    Diastolic CHF, chronic (HCC) 10/24/2011   Atrial flutter (HCC) 10/22/2011   Community acquired bacterial pneumonia 10/21/2011   SOB (shortness of breath) 10/21/2011   N&V (nausea and vomiting) 10/21/2011   ARF (acute renal failure) (HCC) 10/21/2011   Tobacco abuse 10/21/2011   Leukocytosis 10/21/2011   Fever 10/21/2011     Current Outpatient Medications on File Prior to Visit  Medication Sig Dispense Refill   acetaminophen  (TYLENOL ) 500 MG tablet Take 1,000 mg by mouth as needed for mild pain, moderate pain or headache.     apixaban  (ELIQUIS ) 5 MG TABS tablet Take 1 tablet (5 mg total) by mouth 2 (two) times daily. 60 tablet 6   atorvastatin  (LIPITOR) 40 MG tablet Take 1 tablet (40 mg total) by  mouth daily. 90 tablet 0   dapagliflozin  propanediol (FARXIGA ) 10 MG TABS tablet Take 1 tablet (10 mg total) by mouth daily before breakfast. 90 tablet 0   diltiazem  (CARDIZEM  CD) 120 MG 24 hr capsule Take 1 capsule (120 mg total) by mouth 2 (two) times daily. 60 capsule 6   glimepiride  (AMARYL ) 1 MG tablet Take 1 tablet (1 mg total) by mouth in the morning and at bedtime. 180 tablet 0   sitaGLIPtin  (JANUVIA ) 100 MG tablet Take 1 tablet (100 mg total) by mouth daily. 90 tablet 0   No current facility-administered medications on file prior to visit.    Allergies  Allergen Reactions   Metformin  And Related Other (See Comments)    Pt endorses blurred vision with metformin  use.     Social History   Socioeconomic History   Marital status: Married    Spouse name: Not on file   Number of children: Not on file   Years of education: Not on file   Highest education level: Not on file  Occupational History   Occupation: forklift driver  Tobacco Use   Smoking status: Former    Types: Cigarettes    Passive exposure: Past   Smokeless tobacco: Never   Tobacco comments:    Former Smoker Quit April 2013 08/12/21  Vaping Use   Vaping status: Never Used  Substance and Sexual Activity   Alcohol use: Not Currently   Drug use: Not Currently    Comment: "clean for long time"   Sexual activity: Not on file  Other  Topics Concern   Not on file  Social History Narrative   Not on file   Social Drivers of Health   Financial Resource Strain: Medium Risk (08/14/2022)   Overall Financial Resource Strain (CARDIA)    Difficulty of Paying Living Expenses: Somewhat hard  Food Insecurity: No Food Insecurity (08/04/2022)   Hunger Vital Sign    Worried About Running Out of Food in the Last Year: Never true    Ran Out of Food in the Last Year: Never true  Transportation Needs: No Transportation Needs (08/04/2022)   PRAPARE - Administrator, Civil Service (Medical): No    Lack of Transportation  (Non-Medical): No  Physical Activity: Inactive (10/19/2023)   Exercise Vital Sign    Days of Exercise per Week: 0 days    Minutes of Exercise per Session: 0 min  Stress: No Stress Concern Present (10/19/2023)   [employer] of Occupational Health - Occupational Stress Questionnaire    Feeling of Stress : Only a little  Social Connections: Moderately Isolated (10/19/2023)   Social Connection and Isolation Panel [NHANES]    Frequency of Communication with Friends and Family: More than three times a week    Frequency of Social Gatherings with Friends and Family: More than three times a week    Attends Religious Services: Never    Database administrator or Organizations: No    Attends Banker Meetings: Never    Marital Status: Married  Catering manager Violence: Not At Risk (08/04/2022)   Humiliation, Afraid, Rape, and Kick questionnaire    Fear of Current or Ex-Partner: No    Emotionally Abused: No    Physically Abused: No    Sexually Abused: No    Family History  Problem Relation Age of Onset   Diabetes Sister    Heart disease Neg Hx    Colon cancer Neg Hx    Stomach cancer Neg Hx    Esophageal cancer Neg Hx     Past Surgical History:  Procedure Laterality Date   CARDIOVERSION N/A 10/29/2020   Procedure: CARDIOVERSION;  Surgeon: Nahser, Lela Purple, MD;  Location: [facility];  Service: Cardiovascular;  Laterality: N/A;   COLONOSCOPY      ROS: Review of Systems Negative except as stated above  PHYSICAL EXAM: BP (!) 144/81   Pulse 82   Temp 98.6 F (37 C) (Oral)   Resp 18   Ht 5\' 10"  (1.778 m)   Wt 246 lb 3.2 oz (111.7 kg)   SpO2 94%   BMI 35.33 kg/m   Physical Exam HENT:     Head: Normocephalic and atraumatic.     Nose: Nose normal.     Mouth/Throat:     Mouth: Mucous membranes are moist.     Pharynx: Oropharynx is clear.  Eyes:     Extraocular Movements: Extraocular movements intact.     Conjunctiva/sclera: Conjunctivae normal.     Pupils:  Pupils are equal, round, and reactive to light.  Cardiovascular:     Rate and Rhythm: Normal rate and regular rhythm.     Pulses: Normal pulses.     Heart sounds: Normal heart sounds.  Pulmonary:     Effort: Pulmonary effort is normal.     Breath sounds: Normal breath sounds.  Musculoskeletal:        General: Normal range of motion.     Cervical back: Normal range of motion and neck supple.  Neurological:     General: No focal deficit  present.     Mental Status: He is alert and oriented to person, place, and time.  Psychiatric:        Mood and Affect: Mood normal.        Behavior: Behavior normal.      ASSESSMENT AND PLAN: 1. Type 2 diabetes mellitus with hyperglycemia, without long-term current use of insulin (HCC) (Primary) - Continue Dapagliflozin  Propranediol and Glimepiride  as prescribed. No refills needed as of present. - Patient declined Hemoglobin A1c on today. Previous hemoglobin A1c 7.1% on 10/19/2023. - Discussed the importance of healthy eating habits, low-carbohydrate diet, low-sugar diet, regular aerobic exercise (at least 150 minutes a week as tolerated) and medication compliance to achieve or maintain control of diabetes. Counseled on medication adherence/adverse effects.  - Follow-up with primary provider in 4 weeks or sooner if needed.  2. Herpes - Continue Valacyclovir  as prescribed. Counseled on medication adherence/adverse effects.  - Follow-up with primary provider as scheduled. - valACYclovir  (VALTREX ) 1000 MG tablet; Take 1 tablet (1,000 mg total) by mouth 2 (two) times daily.  Dispense: 20 tablet; Refill: 2   Patient was given the opportunity to ask questions.  Patient verbalized understanding of the plan and was able to repeat key elements of the plan. Patient was given clear instructions to go to Emergency Department or return to medical center if symptoms don't improve, worsen, or new problems develop.The patient verbalized understanding.   Requested  Prescriptions   Signed Prescriptions Disp Refills   valACYclovir  (VALTREX ) 1000 MG tablet 20 tablet 2    Sig: Take 1 tablet (1,000 mg total) by mouth 2 (two) times daily.    Follow-up with primary provider as scheduled.  Senaida Dama, NP

## 2023-11-26 ENCOUNTER — Telehealth: Payer: Self-pay | Admitting: Family

## 2023-11-26 NOTE — Telephone Encounter (Signed)
 [name] [name]

## 2023-12-04 NOTE — Telephone Encounter (Signed)
 Patient established with [facility] please follow-up with the same. Report to Urgent Care/Emergency Department/call 911 for immediate medical evaluation. Schedule follow-up appointment with Primary Care. Please let me know if I can further assist.

## 2023-12-06 NOTE — Telephone Encounter (Signed)
 Spoke with patient to give recommendations from PCP and he did not know what I was talking about

## 2023-12-21 DIAGNOSIS — L728 Other follicular cysts of the skin and subcutaneous tissue: Secondary | ICD-10-CM | POA: Diagnosis not present

## 2023-12-21 DIAGNOSIS — L739 Follicular disorder, unspecified: Secondary | ICD-10-CM | POA: Diagnosis not present

## 2023-12-25 ENCOUNTER — Other Ambulatory Visit: Payer: Self-pay | Admitting: Family

## 2023-12-25 DIAGNOSIS — E1165 Type 2 diabetes mellitus with hyperglycemia: Secondary | ICD-10-CM

## 2023-12-30 IMAGING — CR DG HIP (WITH OR WITHOUT PELVIS) 2-3V*R*
3 series · 3 of 3 positions shown · non-contrast
Comparison: None Available.

CLINICAL DATA: Right hip pain.

EXAM:
DG HIP (WITH OR WITHOUT PELVIS) 2-3V RIGHT

[t pelvis a.p.]
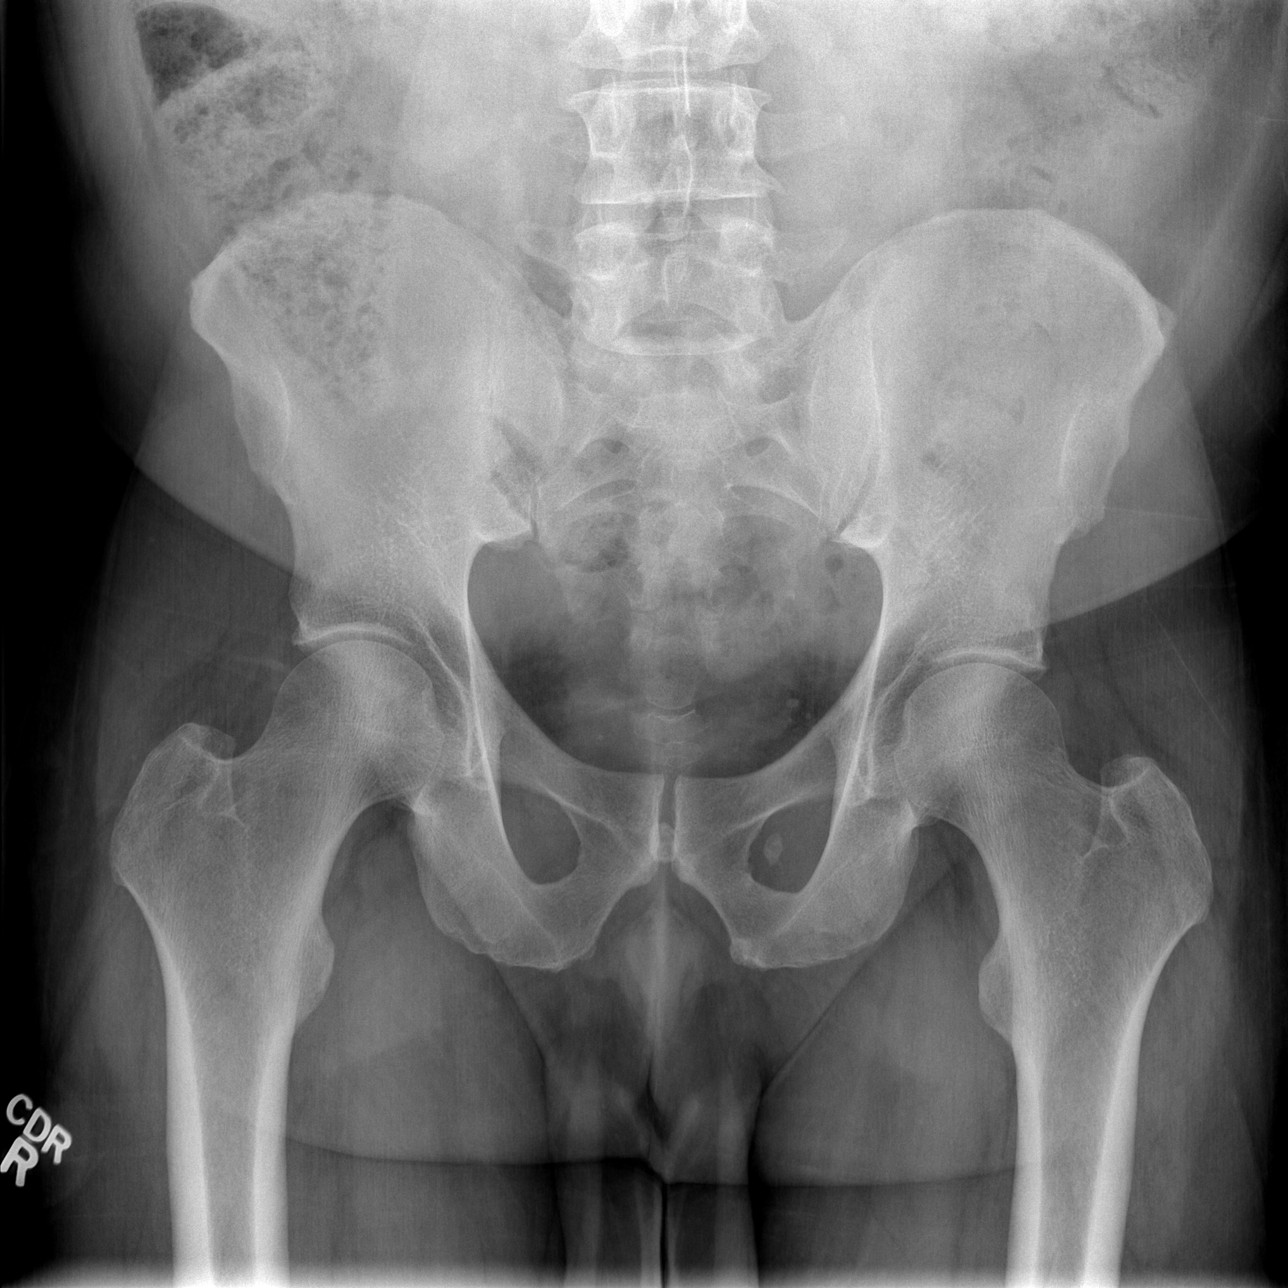

[t hip ap right]
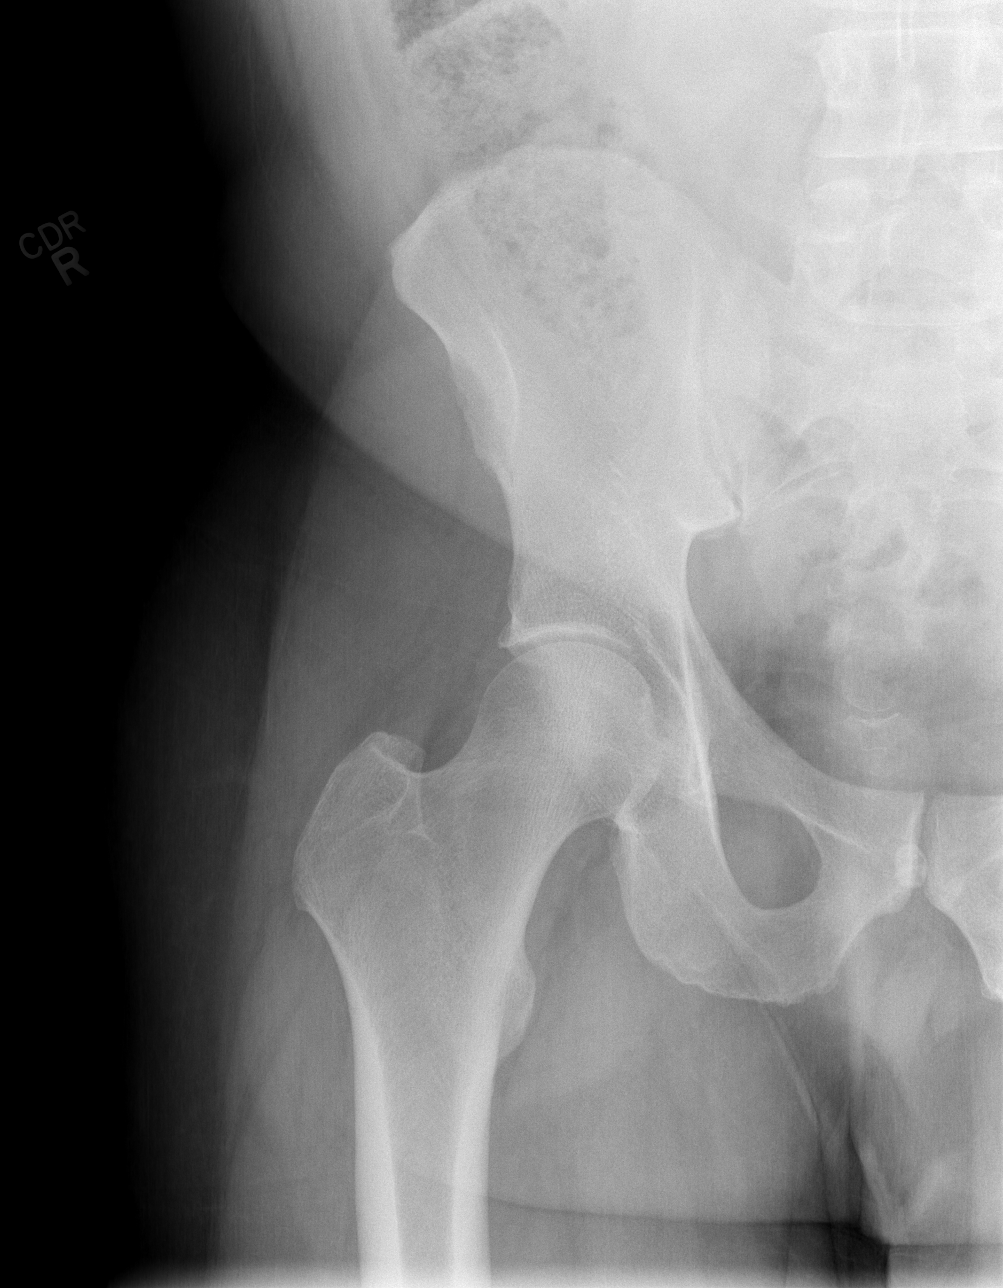

[t hip frog leg right]
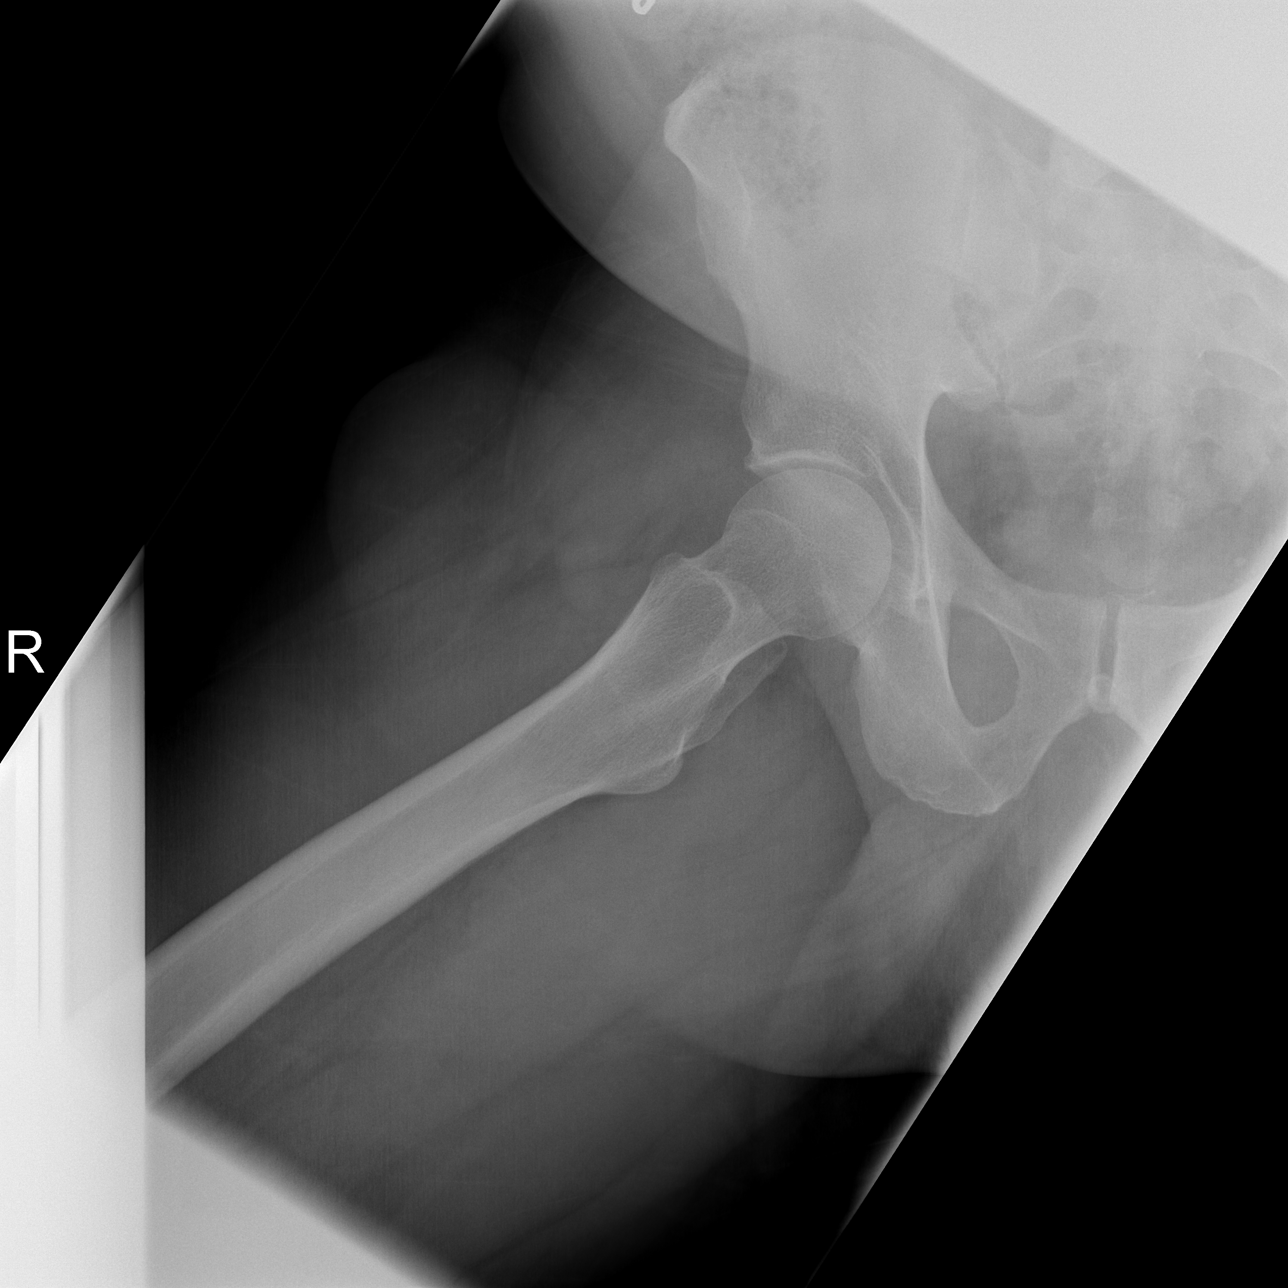

[3 of 3 positions shown; findings below may reference images not displayed]

FINDINGS: There is no evidence of hip fracture or dislocation. There is no
evidence of arthropathy or other focal bone abnormality.
IMPRESSION: Negative.

## 2024-01-02 ENCOUNTER — Telehealth: Payer: Self-pay | Admitting: Family

## 2024-01-02 ENCOUNTER — Other Ambulatory Visit: Payer: Self-pay | Admitting: Family

## 2024-01-02 DIAGNOSIS — B009 Herpesviral infection, unspecified: Secondary | ICD-10-CM

## 2024-01-02 NOTE — Telephone Encounter (Signed)
 Valacyclovir  prescribed on 01/02/2024 11:52 AM.

## 2024-01-02 NOTE — Telephone Encounter (Signed)
 Pt requesting refill for Valtrex . Pt stated he took his last one this morning and needs to take it twice a day. He has a scheduled appt on 06/27. Pt wants refill to hold him until appt. Please advise

## 2024-01-02 NOTE — Telephone Encounter (Signed)
 Complete

## 2024-01-03 NOTE — Telephone Encounter (Signed)
 I spoke with patient and made them aware of their lab results and recommendations per PCP.  Patient verbalized understanding

## 2024-01-04 ENCOUNTER — Ambulatory Visit (INDEPENDENT_AMBULATORY_CARE_PROVIDER_SITE_OTHER): Admitting: Family

## 2024-01-04 ENCOUNTER — Ambulatory Visit: Payer: Self-pay | Admitting: Family

## 2024-01-04 ENCOUNTER — Encounter: Payer: Self-pay | Admitting: Family

## 2024-01-04 VITALS — BP 118/72 | HR 76 | Temp 98.2°F | Resp 18 | Ht 70.0 in | Wt 245.0 lb

## 2024-01-04 DIAGNOSIS — Z7984 Long term (current) use of oral hypoglycemic drugs: Secondary | ICD-10-CM

## 2024-01-04 DIAGNOSIS — E1165 Type 2 diabetes mellitus with hyperglycemia: Secondary | ICD-10-CM | POA: Diagnosis not present

## 2024-01-04 LAB — POCT GLYCOSYLATED HEMOGLOBIN (HGB A1C): HbA1c, POC (controlled diabetic range): 7.5 % — AB (ref 0.0–7.0)

## 2024-01-04 MED ORDER — GLIMEPIRIDE 4 MG PO TABS: 4.0000 mg | ORAL_TABLET | Freq: Every day | ORAL | 0 refills | Status: DC

## 2024-01-04 MED ORDER — GLIMEPIRIDE 1 MG PO TABS: 1.0000 mg | ORAL_TABLET | Freq: Two times a day (BID) | ORAL | 0 refills | Status: DC

## 2024-01-04 MED ORDER — DAPAGLIFLOZIN PROPANEDIOL 10 MG PO TABS
10.0000 mg | ORAL_TABLET | Freq: Every day | ORAL | 0 refills | Status: DC
Start: 2024-01-04 — End: 2024-04-04

## 2024-01-04 NOTE — Progress Notes (Signed)
 Follow up.

## 2024-01-04 NOTE — Progress Notes (Signed)
 Patient ID: [name] [name], male    DOB: 04-05-1966  MRN: [id number]  CC: Chronic Conditions Follow-Up  Subjective: [name] [name] is a 58 y.o. male who presents for chronic conditions follow-up.   His concerns today include:  - Doing well on Dapagliflozin  Propanediol and Glimepiride , no issues/concerns. He denies red flag symptoms associated with diabetes.   Patient Active Problem List   Diagnosis Date Noted   Hypercoagulable state due to paroxysmal atrial fibrillation (HCC) 02/23/2023   Sickle cell trait (HCC) 08/15/2022   Polycythemia, secondary 08/15/2022   RBC microcytosis 08/04/2022   OSA (obstructive sleep apnea) 07/13/2022   Herpes 07/08/2022   Hyperlipidemia 11/30/2020   Persistent atrial fibrillation (HCC)    Paroxysmal atrial fibrillation (HCC) 05/29/2019   Substance abuse (HCC)    Diastolic CHF, chronic    Diastolic CHF, chronic (HCC) 10/24/2011   Atrial flutter (HCC) 10/22/2011   Community acquired bacterial pneumonia 10/21/2011   SOB (shortness of breath) 10/21/2011   N&V (nausea and vomiting) 10/21/2011   ARF (acute renal failure) (HCC) 10/21/2011   Tobacco abuse 10/21/2011   Leukocytosis 10/21/2011   Fever 10/21/2011     Current Outpatient Medications on File Prior to Visit  Medication Sig Dispense Refill   acetaminophen  (TYLENOL ) 500 MG tablet Take 1,000 mg by mouth as needed for mild pain, moderate pain or headache.     apixaban  (ELIQUIS ) 5 MG TABS tablet Take 1 tablet (5 mg total) by mouth 2 (two) times daily. 60 tablet 6   atorvastatin  (LIPITOR) 40 MG tablet Take 1 tablet (40 mg total) by mouth daily. 90 tablet 0   diltiazem  (CARDIZEM  CD) 120 MG 24 hr capsule Take 1 capsule (120 mg total) by mouth 2 (two) times daily. 60 capsule 6   sitaGLIPtin  (JANUVIA ) 100 MG tablet Take 1 tablet (100 mg total) by mouth daily. 90 tablet 0   valACYclovir  (VALTREX ) 1000 MG tablet Take 1 tablet by mouth twice daily 20 tablet 11   No current facility-administered  medications on file prior to visit.    Allergies  Allergen Reactions   Metformin  And Related Other (See Comments)    Pt endorses blurred vision with metformin  use.     Social History   Socioeconomic History   Marital status: Married    Spouse name: Not on file   Number of children: Not on file   Years of education: Not on file   Highest education level: Not on file  Occupational History   Occupation: forklift driver  Tobacco Use   Smoking status: Former    Types: Cigarettes    Passive exposure: Past   Smokeless tobacco: Never   Tobacco comments:    Former Smoker Quit April 2013 08/12/21  Vaping Use   Vaping status: Never Used  Substance and Sexual Activity   Alcohol use: Not Currently   Drug use: Not Currently    Comment: clean for long time   Sexual activity: Not on file  Other Topics Concern   Not on file  Social History Narrative   Not on file   Social Drivers of Health   Financial Resource Strain: Medium Risk (08/14/2022)   Overall Financial Resource Strain (CARDIA)    Difficulty of Paying Living Expenses: Somewhat hard  Food Insecurity: No Food Insecurity (08/04/2022)   Hunger Vital Sign    Worried About Running Out of Food in the Last Year: Never true    Ran Out of Food in the Last Year: Never true  Transportation Needs:  No Transportation Needs (08/04/2022)   PRAPARE - Administrator, Civil Service (Medical): No    Lack of Transportation (Non-Medical): No  Physical Activity: Inactive (10/19/2023)   Exercise Vital Sign    Days of Exercise per Week: 0 days    Minutes of Exercise per Session: 0 min  Stress: No Stress Concern Present (10/19/2023)   [employer] of Occupational Health - Occupational Stress Questionnaire    Feeling of Stress : Only a little  Social Connections: Moderately Isolated (10/19/2023)   Social Connection and Isolation Panel    Frequency of Communication with Friends and Family: More than three times a week    Frequency  of Social Gatherings with Friends and Family: More than three times a week    Attends Religious Services: Never    Database administrator or Organizations: No    Attends Banker Meetings: Never    Marital Status: Married  Catering manager Violence: Not At Risk (08/04/2022)   Humiliation, Afraid, Rape, and Kick questionnaire    Fear of Current or Ex-Partner: No    Emotionally Abused: No    Physically Abused: No    Sexually Abused: No    Family History  Problem Relation Age of Onset   Diabetes Sister    Heart disease Neg Hx    Colon cancer Neg Hx    Stomach cancer Neg Hx    Esophageal cancer Neg Hx     Past Surgical History:  Procedure Laterality Date   CARDIOVERSION N/A 10/29/2020   Procedure: CARDIOVERSION;  Surgeon: Nahser, Aleene PARAS, MD;  Location: [facility];  Service: Cardiovascular;  Laterality: N/A;   COLONOSCOPY      ROS: Review of Systems Negative except as stated above  PHYSICAL EXAM: BP 118/72   Pulse 76   Temp 98.2 F (36.8 C) (Oral)   Resp 18   Ht 5' 10 (1.778 m)   Wt 245 lb (111.1 kg)   SpO2 93%   BMI 35.15 kg/m   Physical Exam HENT:     Head: Normocephalic and atraumatic.     Nose: Nose normal.     Mouth/Throat:     Mouth: Mucous membranes are moist.     Pharynx: Oropharynx is clear.   Eyes:     Extraocular Movements: Extraocular movements intact.     Conjunctiva/sclera: Conjunctivae normal.     Pupils: Pupils are equal, round, and reactive to light.    Cardiovascular:     Rate and Rhythm: Normal rate and regular rhythm.     Pulses: Normal pulses.     Heart sounds: Normal heart sounds.  Pulmonary:     Effort: Pulmonary effort is normal.     Breath sounds: Normal breath sounds.   Musculoskeletal:        General: Normal range of motion.     Cervical back: Normal range of motion and neck supple.   Neurological:     General: No focal deficit present.     Mental Status: He is alert and oriented to person, place, and time.    Psychiatric:        Mood and Affect: Mood normal.        Behavior: Behavior normal.      ASSESSMENT AND PLAN: 1. Type 2 diabetes mellitus with hyperglycemia, without long-term current use of insulin (HCC) (Primary) - Continue Dapagliflozin  Propanediol and Glimepiride  as prescribed.  - Hemoglobin A1c result pending.  - Discussed the importance of healthy eating habits, low-carbohydrate  diet, low-sugar diet, regular aerobic exercise (at least 150 minutes a week as tolerated) and medication compliance to achieve or maintain control of diabetes. Counseled on medication adherence/adverse effects.  - Follow-up with primary provider as scheduled. - POCT glycosylated hemoglobin (Hb A1C) - glimepiride  (AMARYL ) 1 MG tablet; Take 1 tablet (1 mg total) by mouth in the morning and at bedtime.  Dispense: 180 tablet; Refill: 0 - dapagliflozin  propanediol (FARXIGA ) 10 MG TABS tablet; Take 1 tablet (10 mg total) by mouth daily before breakfast.  Dispense: 90 tablet; Refill: 0   Patient was given the opportunity to ask questions.  Patient verbalized understanding of the plan and was able to repeat key elements of the plan. Patient was given clear instructions to go to Emergency Department or return to medical center if symptoms don't improve, worsen, or new problems develop.The patient verbalized understanding.   Orders Placed This Encounter  Procedures   POCT glycosylated hemoglobin (Hb A1C)     Requested Prescriptions   Signed Prescriptions Disp Refills   glimepiride  (AMARYL ) 1 MG tablet 180 tablet 0    Sig: Take 1 tablet (1 mg total) by mouth in the morning and at bedtime.   dapagliflozin  propanediol (FARXIGA ) 10 MG TABS tablet 90 tablet 0    Sig: Take 1 tablet (10 mg total) by mouth daily before breakfast.    Follow-up with primary provider as scheduled.  Greig JINNY Drones, NP

## 2024-01-18 ENCOUNTER — Ambulatory Visit: Admitting: Family

## 2024-02-19 ENCOUNTER — Other Ambulatory Visit: Payer: Self-pay | Admitting: Family

## 2024-02-19 DIAGNOSIS — E785 Hyperlipidemia, unspecified: Secondary | ICD-10-CM

## 2024-02-29 ENCOUNTER — Ambulatory Visit (HOSPITAL_COMMUNITY)
Admission: RE | Admit: 2024-02-29 | Discharge: 2024-02-29 | Disposition: A | Discharge status: Home / Self Care | Payer: BC Managed Care – PPO | Source: Ambulatory Visit | Attending: Physician Assistant | Admitting: Physician Assistant

## 2024-02-29 VITALS — BP 138/68 | HR 79 | Ht 70.0 in | Wt 246.2 lb

## 2024-02-29 DIAGNOSIS — I480 Paroxysmal atrial fibrillation: Secondary | ICD-10-CM

## 2024-02-29 DIAGNOSIS — I4891 Unspecified atrial fibrillation: Secondary | ICD-10-CM

## 2024-02-29 DIAGNOSIS — D6869 Other thrombophilia: Secondary | ICD-10-CM | POA: Diagnosis not present

## 2024-02-29 MED ORDER — APIXABAN 5 MG PO TABS: 5.0000 mg | ORAL_TABLET | Freq: Two times a day (BID) | ORAL | 11 refills | Status: AC

## 2024-02-29 NOTE — Patient Instructions (Signed)
 Follow up with Dr Shlomo - will have scheduling contact you.   Follow up with Ricky in 1 year

## 2024-02-29 NOTE — Progress Notes (Signed)
 Primary Care Physician: Lorren Greig PARAS, NP Primary Cardiologist: Dr Maranda (remotely) Primary Electrophysiologist: none Referring Physician: Sharla [facility]   [name] [name] is a 58 y.o. male with a history of paroxysmal afib, atrial flutter, diastolic CHF, OSA, DM, and tobacco use. He was initially diagnosed with afib in 2013 in the setting of CAP. He spontaneously converted back to SR. Unfortunately, patient tested positive for COVID-19 and was found to be in atrial flutter and later in afib at the ER. He converted to SR after diltiazem  drip. He was asymptomatic during the episode. He denies any alcohol use.   Patient has had multiple ED visits for various reasons. He presented to the ED on 10/07/20 for cough and sore throat and was incidentally found to be in rapid atrial flutter. He underwent DCCV at that time. He does admit to taking psuedophedrine before the onset of his symptoms. He was back in rapid flutter on follow up and started on flecainide . Patient is s/p DCCV 10/29/20. Flecainide  later discontinued as he was maintaining SR.  Patient returns for follow up for atrial fibrillation. He remains in SR today. He denies any interim symptoms of afib after stopping flecainide . He uses his CPAP consistently. No bleeding issues on anticoagulation.   Today, he  denies symptoms of palpitations, chest pain, shortness of breath, orthopnea, PND, lower extremity edema, dizziness, presyncope, syncope, bleeding, or neurologic sequela. The patient is tolerating medications without difficulties and is otherwise without complaint today.    Atrial Fibrillation Risk Factors:  he does have symptoms or diagnosis of sleep apnea. he does have a history of alcohol use. Does not drink currently.   Atrial Fibrillation Management history:  Previous antiarrhythmic drugs: flecainide   Previous cardioversions: 10/29/20 Previous ablations: none Anticoagulation history: Eliquis    Past Medical History:   Diagnosis Date   ARF (acute renal failure) (HCC)    Atrial flutter (HCC)    Community acquired bacterial pneumonia    Diastolic CHF, chronic (HCC)    Leukocytosis    N&V (nausea and vomiting)    SOB (shortness of breath)    Substance abuse (HCC)    Prior hx of cocaine (quit 5 years ago)/EtOH (clean for 14 months) as of 10/2011    Current Outpatient Medications  Medication Sig Dispense Refill   acetaminophen  (TYLENOL ) 500 MG tablet Take 1,000 mg by mouth as needed for mild pain, moderate pain or headache.     apixaban  (ELIQUIS ) 5 MG TABS tablet Take 1 tablet (5 mg total) by mouth 2 (two) times daily. 60 tablet 6   atorvastatin  (LIPITOR) 40 MG tablet Take 1 tablet by mouth once daily 90 tablet 0   dapagliflozin  propanediol (FARXIGA ) 10 MG TABS tablet Take 1 tablet (10 mg total) by mouth daily before breakfast. 90 tablet 0   diltiazem  (CARDIZEM  CD) 120 MG 24 hr capsule Take 1 capsule (120 mg total) by mouth 2 (two) times daily. 60 capsule 6   glimepiride  (AMARYL ) 4 MG tablet Take 1 tablet (4 mg total) by mouth daily before breakfast. 90 tablet 0   valACYclovir  (VALTREX ) 1000 MG tablet Take 1 tablet by mouth twice daily 20 tablet 11   glimepiride  (AMARYL ) 1 MG tablet Take 1 tablet (1 mg total) by mouth in the morning and at bedtime. (Patient not taking: Reported on 02/29/2024) 180 tablet 0   No current facility-administered medications for this encounter.    ROS- All systems are reviewed and negative except as per the HPI above.  Physical Exam:  Vitals:   02/29/24 1039  BP: 138/68  Pulse: 79  Weight: 111.7 kg  Height: 5' 10 (1.778 m)    GEN: Well nourished, well developed in no acute distress CARDIAC: Regular rate and rhythm with occasional ectopy, no murmurs, rubs, gallops RESPIRATORY:  Clear to auscultation without rales, wheezing or rhonchi  ABDOMEN: Soft, non-tender, non-distended EXTREMITIES:  No edema; No deformity    Wt Readings from Last 3 Encounters:  02/29/24 111.7  kg  01/04/24 111.1 kg  11/23/23 111.7 kg    EKG today demonstrates  SR, PACs Vent. rate 79 BPM PR interval 128 ms QRS duration 88 ms QT/QTcB 352/403 ms   Echo 11/19/20 demonstrated   1. Left ventricular ejection fraction, by estimation, is 55 to 60%. The  left ventricle has normal function. The left ventricle demonstrates global hypokinesis. Left ventricular diastolic parameters were normal.   2. Right ventricular systolic function is normal. The right ventricular  size is normal. Tricuspid regurgitation signal is inadequate for assessing PA pressure.   3. The mitral valve is grossly normal. Trivial mitral valve  regurgitation.   4. The aortic valve is tricuspid. Aortic valve regurgitation is not  visualized.   5. The inferior vena cava is dilated in size with >50% respiratory  variability, suggesting right atrial pressure of 8 mmHg.   Comparison(s): Prior images unable to be directly viewed, comparison made by report only. No significant change from prior study. 10/23/2011: LVEF 55-60%.    Epic records are reviewed at length today  CHA2DS2-VASc Score = 2  The patient's score is based upon: CHF History: 0 HTN History: 1 Diabetes History: 1 Stroke History: 0 Vascular Disease History: 0 Age Score: 0 Gender Score: 0       ASSESSMENT AND PLAN: Paroxysmal Atrial Fibrillation/typical atrial flutter (ICD10:  I48.0) The patient's CHA2DS2-VASc score is 2, indicating a 2.2% annual risk of stroke.   Patient appears to be maintaining SR off flecainide .  If he has recurrent afib, would refer for ablation.  Continue Eliquis  5 mg BID Continue diltiazem  120 mg BID  Secondary Hypercoagulable State (ICD10:  D68.69) The patient is at significant risk for stroke/thromboembolism based upon his CHA2DS2-VASc Score of 2.  Continue Apixaban  (Eliquis ). No bleeding issues.   Obesity Body mass index is 35.33 kg/m.  Encouraged lifestyle modification  OSA  Encouraged nightly  CPAP   Follow up in the AF clinic in one year. Patient overdue for follow up with Dr Shlomo for OSA.   Daril Kicks PA-C [facility] [address] [place], [place] [address] [phone] 02/29/2024 10:57 AM

## 2024-03-25 ENCOUNTER — Other Ambulatory Visit: Payer: Self-pay | Admitting: Family

## 2024-03-25 DIAGNOSIS — E1165 Type 2 diabetes mellitus with hyperglycemia: Secondary | ICD-10-CM

## 2024-03-26 NOTE — Telephone Encounter (Signed)
 Complete

## 2024-04-04 ENCOUNTER — Encounter: Payer: Self-pay | Admitting: Family

## 2024-04-04 ENCOUNTER — Ambulatory Visit (INDEPENDENT_AMBULATORY_CARE_PROVIDER_SITE_OTHER): Admitting: Family

## 2024-04-04 VITALS — BP 137/79 | HR 73 | Temp 97.7°F | Resp 16 | Ht 70.0 in | Wt 242.6 lb

## 2024-04-04 DIAGNOSIS — F419 Anxiety disorder, unspecified: Secondary | ICD-10-CM

## 2024-04-04 DIAGNOSIS — F32A Depression, unspecified: Secondary | ICD-10-CM | POA: Diagnosis not present

## 2024-04-04 DIAGNOSIS — E1165 Type 2 diabetes mellitus with hyperglycemia: Secondary | ICD-10-CM | POA: Diagnosis not present

## 2024-04-04 DIAGNOSIS — Z7984 Long term (current) use of oral hypoglycemic drugs: Secondary | ICD-10-CM | POA: Diagnosis not present

## 2024-04-04 MED ORDER — GLIMEPIRIDE 4 MG PO TABS: 4.0000 mg | ORAL_TABLET | Freq: Every day | ORAL | 0 refills | Status: DC

## 2024-04-04 MED ORDER — DAPAGLIFLOZIN PROPANEDIOL 10 MG PO TABS: 10.0000 mg | ORAL_TABLET | Freq: Every day | ORAL | 0 refills | Status: DC

## 2024-04-04 NOTE — Progress Notes (Signed)
 Patient ID: [name] [name], male    DOB: Mar 16, 1966  MRN: [id number]  CC: Chronic Conditions Follow-Up  Subjective: [name] [name] is a 58 y.o. male who presents for chronic conditions follow-up.   His concerns today include:  - Doing well on Dapagliflozin  Propanediol and Glimepiride , no issues/concerns. Denies red flag symptoms associated with diabetes.  - Anxiety depression. States he is dealing with something in the outside world. He denies thoughts of self-harm, suicidal ideations, homicidal ideations.  Patient Active Problem List   Diagnosis Date Noted   Hypercoagulable state due to paroxysmal atrial fibrillation (HCC) 02/23/2023   Sickle cell trait 08/15/2022   Polycythemia, secondary 08/15/2022   RBC microcytosis 08/04/2022   OSA (obstructive sleep apnea) 07/13/2022   Herpes 07/08/2022   Hyperlipidemia 11/30/2020   Persistent atrial fibrillation (HCC)    Paroxysmal atrial fibrillation (HCC) 05/29/2019   Substance abuse (HCC)    Diastolic CHF, chronic    Diastolic CHF, chronic (HCC) 10/24/2011   Atrial flutter (HCC) 10/22/2011   Community acquired bacterial pneumonia 10/21/2011   SOB (shortness of breath) 10/21/2011   N&V (nausea and vomiting) 10/21/2011   ARF (acute renal failure) 10/21/2011   Tobacco abuse 10/21/2011   Leukocytosis 10/21/2011   Fever 10/21/2011     Current Outpatient Medications on File Prior to Visit  Medication Sig Dispense Refill   acetaminophen  (TYLENOL ) 500 MG tablet Take 1,000 mg by mouth as needed for mild pain, moderate pain or headache.     apixaban  (ELIQUIS ) 5 MG TABS tablet Take 1 tablet (5 mg total) by mouth 2 (two) times daily. 60 tablet 11   atorvastatin  (LIPITOR) 40 MG tablet Take 1 tablet by mouth once daily 90 tablet 0   diltiazem  (CARDIZEM  CD) 120 MG 24 hr capsule Take 1 capsule (120 mg total) by mouth 2 (two) times daily. 60 capsule 6   glimepiride  (AMARYL ) 1 MG tablet TAKE 1 TABLET BY MOUTH IN THE MORNING AND AT BEDTIME 180  tablet 0   valACYclovir  (VALTREX ) 1000 MG tablet Take 1 tablet by mouth twice daily 20 tablet 11   No current facility-administered medications on file prior to visit.    Allergies  Allergen Reactions   Metformin  And Related Other (See Comments)    Pt endorses blurred vision with metformin  use.     Social History   Socioeconomic History   Marital status: Married    Spouse name: Not on file   Number of children: Not on file   Years of education: Not on file   Highest education level: Not on file  Occupational History   Occupation: forklift driver  Tobacco Use   Smoking status: Former    Types: Cigarettes    Passive exposure: Past   Smokeless tobacco: Never   Tobacco comments:    Former Smoker Quit April 2013 08/12/21  Vaping Use   Vaping status: Never Used  Substance and Sexual Activity   Alcohol use: Not Currently   Drug use: Not Currently    Comment: clean for long time   Sexual activity: Yes  Other Topics Concern   Not on file  Social History Narrative   Not on file   Social Drivers of Health   Financial Resource Strain: Medium Risk (08/14/2022)   Overall Financial Resource Strain (CARDIA)    Difficulty of Paying Living Expenses: Somewhat hard  Food Insecurity: No Food Insecurity (08/04/2022)   Hunger Vital Sign    Worried About Running Out of Food in the Last Year: Never  true    Ran Out of Food in the Last Year: Never true  Transportation Needs: No Transportation Needs (08/04/2022)   PRAPARE - Administrator, Civil Service (Medical): No    Lack of Transportation (Non-Medical): No  Physical Activity: Inactive (10/19/2023)   Exercise Vital Sign    Days of Exercise per Week: 0 days    Minutes of Exercise per Session: 0 min  Stress: No Stress Concern Present (10/19/2023)   [employer] of Occupational Health - Occupational Stress Questionnaire    Feeling of Stress : Only a little  Social Connections: Moderately Isolated (10/19/2023)   Social  Connection and Isolation Panel    Frequency of Communication with Friends and Family: More than three times a week    Frequency of Social Gatherings with Friends and Family: More than three times a week    Attends Religious Services: Never    Database administrator or Organizations: No    Attends Banker Meetings: Never    Marital Status: Married  Catering manager Violence: Not At Risk (08/04/2022)   Humiliation, Afraid, Rape, and Kick questionnaire    Fear of Current or Ex-Partner: No    Emotionally Abused: No    Physically Abused: No    Sexually Abused: No    Family History  Problem Relation Age of Onset   Diabetes Sister    Heart disease Neg Hx    Colon cancer Neg Hx    Stomach cancer Neg Hx    Esophageal cancer Neg Hx     Past Surgical History:  Procedure Laterality Date   CARDIOVERSION N/A 10/29/2020   Procedure: CARDIOVERSION;  Surgeon: Nahser, Aleene PARAS, MD;  Location: [facility];  Service: Cardiovascular;  Laterality: N/A;   COLONOSCOPY      ROS: Review of Systems Negative except as stated above  PHYSICAL EXAM: BP 137/79   Pulse 73   Temp 97.7 F (36.5 C) (Oral)   Resp 16   Ht 5' 10 (1.778 m)   Wt 242 lb 9.6 oz (110 kg)   SpO2 94%   BMI 34.81 kg/m   Physical Exam HENT:     Head: Normocephalic and atraumatic.     Nose: Nose normal.     Mouth/Throat:     Mouth: Mucous membranes are moist.     Pharynx: Oropharynx is clear.  Eyes:     Extraocular Movements: Extraocular movements intact.     Conjunctiva/sclera: Conjunctivae normal.     Pupils: Pupils are equal, round, and reactive to light.  Cardiovascular:     Rate and Rhythm: Normal rate and regular rhythm.     Pulses: Normal pulses.     Heart sounds: Normal heart sounds.  Pulmonary:     Effort: Pulmonary effort is normal.     Breath sounds: Normal breath sounds.  Musculoskeletal:        General: Normal range of motion.     Cervical back: Normal range of motion and neck supple.   Neurological:     General: No focal deficit present.     Mental Status: He is alert and oriented to person, place, and time.  Psychiatric:        Mood and Affect: Mood normal.        Behavior: Behavior normal.     ASSESSMENT AND PLAN: 1. Type 2 diabetes mellitus with hyperglycemia, without long-term current use of insulin (HCC) (Primary) - Hemoglobin A1c result pending.  - Continue Dapagliflozin  Propanediol and Glimepiride   as prescribed.  - Routine screening.  - Discussed the importance of healthy eating habits, low-carbohydrate diet, low-sugar diet, regular aerobic exercise (at least 150 minutes a week as tolerated) and medication compliance to achieve or maintain control of diabetes. Counseled on medication adherence/adverse effects.  - Follow-up with primary provider as scheduled. - glimepiride  (AMARYL ) 4 MG tablet; Take 1 tablet (4 mg total) by mouth daily before breakfast.  Dispense: 90 tablet; Refill: 0 - dapagliflozin  propanediol (FARXIGA ) 10 MG TABS tablet; Take 1 tablet (10 mg total) by mouth daily before breakfast.  Dispense: 90 tablet; Refill: 0 - Hemoglobin A1c - Basic Metabolic Panel  2. Anxiety and depression - Patient denies thoughts of self-harm, suicidal ideations, homicidal ideations. - Patient declined pharmacological therapy.  - Patient declined referral to Psychiatry.  - Follow-up with primary provider as scheduled.   Patient was given the opportunity to ask questions.  Patient verbalized understanding of the plan and was able to repeat key elements of the plan. Patient was given clear instructions to go to Emergency Department or return to medical center if symptoms don't improve, worsen, or new problems develop.The patient verbalized understanding.   Orders Placed This Encounter  Procedures   Hemoglobin A1c   Basic Metabolic Panel     Requested Prescriptions   Signed Prescriptions Disp Refills   glimepiride  (AMARYL ) 4 MG tablet 90 tablet 0    Sig:  Take 1 tablet (4 mg total) by mouth daily before breakfast.   dapagliflozin  propanediol (FARXIGA ) 10 MG TABS tablet 90 tablet 0    Sig: Take 1 tablet (10 mg total) by mouth daily before breakfast.    Return in about 3 months (around 07/04/2024) for Follow-Up or next available chronic conditions.  Greig JINNY Drones, NP

## 2024-04-04 NOTE — Progress Notes (Signed)
 3 month follow up, patient scored a 12 on the GAD-7

## 2024-04-05 LAB — BASIC METABOLIC PANEL WITH GFR
BUN/Creatinine Ratio: 8 — ABNORMAL LOW (ref 9–20)
BUN: 10 mg/dL (ref 6–24)
CO2: 20 mmol/L (ref 20–29)
Calcium: 9.2 mg/dL (ref 8.7–10.2)
Chloride: 106 mmol/L (ref 96–106)
Creatinine, Ser: 1.23 mg/dL (ref 0.76–1.27)
Glucose: 98 mg/dL (ref 70–99)
Potassium: 4.4 mmol/L (ref 3.5–5.2)
Sodium: 142 mmol/L (ref 134–144)
eGFR: 69 mL/min/1.73 (ref >59)

## 2024-04-05 LAB — HEMOGLOBIN A1C
Est. average glucose Bld gHb Est-mCnc: 154 mg/dL
Hgb A1c MFr Bld: 7 % — ABNORMAL HIGH (ref 4.8–5.6)

## 2024-04-07 ENCOUNTER — Telehealth: Payer: Self-pay | Admitting: Family

## 2024-04-07 ENCOUNTER — Ambulatory Visit: Payer: Self-pay | Admitting: Family

## 2024-04-07 NOTE — Telephone Encounter (Signed)
 Pt requesting lab results. Please advise.

## 2024-04-07 NOTE — Telephone Encounter (Signed)
Patient viewed lab results on mychart.

## 2024-05-14 ENCOUNTER — Encounter (HOSPITAL_COMMUNITY): Payer: Self-pay

## 2024-05-14 ENCOUNTER — Ambulatory Visit (HOSPITAL_COMMUNITY)
Admission: EM | Admit: 2024-05-14 | Discharge: 2024-05-14 | Disposition: A | Discharge status: Home / Self Care | Attending: Internal Medicine | Admitting: Internal Medicine

## 2024-05-14 DIAGNOSIS — L0291 Cutaneous abscess, unspecified: Secondary | ICD-10-CM

## 2024-05-14 MED ORDER — SULFAMETHOXAZOLE-TRIMETHOPRIM 800-160 MG PO TABS
1.0000 | ORAL_TABLET | Freq: Two times a day (BID) | ORAL | 0 refills | Status: AC
Start: 2024-05-14 — End: 2024-05-21

## 2024-05-14 MED ORDER — LIDOCAINE HCL 2 % IJ SOLN
INTRAMUSCULAR | Status: AC
  Filled 2024-05-14: qty 20

## 2024-05-14 MED ORDER — LIDOCAINE HCL (PF) 1 % IJ SOLN: 5.0000 mL | Freq: Once | INTRAMUSCULAR | Status: DC

## 2024-05-14 NOTE — ED Provider Notes (Signed)
 [facility]    CSN: [id number] Arrival date & time: 05/14/24  0800      History   Chief Complaint Chief Complaint  Patient presents with   Abscess    HPI [name] [name] is a 58 y.o. male presenting to urgent care today for evaluation of bilateral thigh abscesses.  He states that this is a recurring issue.  He is current abscesses have been present for the last 2 weeks with gradually increasing discomfort.  He states that the abscess in his right groin spontaneously ruptured earlier today but the 1 in the left groin has not.  He has attempted to continue working but has experienced worsening discomfort.  Denies systemic symptoms of fever/chills and fatigue.  Pain is located along the inner thigh.  Denies any discomfort along his scrotum or penis.  Past Medical History:  Diagnosis Date   ARF (acute renal failure)    Atrial flutter (HCC)    Community acquired bacterial pneumonia    Diastolic CHF, chronic (HCC)    Leukocytosis    N&V (nausea and vomiting)    SOB (shortness of breath)    Substance abuse (HCC)    Prior hx of cocaine (quit 5 years ago)/EtOH (clean for 14 months) as of 10/2011    Patient Active Problem List   Diagnosis Date Noted   Hypercoagulable state due to paroxysmal atrial fibrillation (HCC) 02/23/2023   Sickle cell trait 08/15/2022   Polycythemia, secondary 08/15/2022   RBC microcytosis 08/04/2022   OSA (obstructive sleep apnea) 07/13/2022   Herpes 07/08/2022   Hyperlipidemia 11/30/2020   Persistent atrial fibrillation (HCC)    Paroxysmal atrial fibrillation (HCC) 05/29/2019   Substance abuse (HCC)    Diastolic CHF, chronic    Diastolic CHF, chronic (HCC) 10/24/2011   Atrial flutter (HCC) 10/22/2011   Community acquired bacterial pneumonia 10/21/2011   SOB (shortness of breath) 10/21/2011   N&V (nausea and vomiting) 10/21/2011   ARF (acute renal failure) 10/21/2011   Tobacco abuse 10/21/2011   Leukocytosis 10/21/2011   Fever 10/21/2011     Past Surgical History:  Procedure Laterality Date   CARDIOVERSION N/A 10/29/2020   Procedure: CARDIOVERSION;  Surgeon: Nahser, Aleene PARAS, MD;  [facility];  Service: Cardiovascular;  Laterality: N/A;   COLONOSCOPY         Home Medications    Prior to Admission medications   Medication Sig Start Date End Date Taking? Authorizing Provider  sulfamethoxazole -trimethoprim  (BACTRIM  DS) 800-160 MG tablet Take 1 tablet by mouth 2 (two) times daily for 7 days. 05/14/24 05/21/24 Yes Melvenia Manus BRAVO, MD  acetaminophen  (TYLENOL ) 500 MG tablet Take 1,000 mg by mouth as needed for mild pain, moderate pain or headache.    [provider]  apixaban  (ELIQUIS ) 5 MG TABS tablet Take 1 tablet (5 mg total) by mouth 2 (two) times daily. 02/29/24   Fenton, Clint R, PA  atorvastatin  (LIPITOR) 40 MG tablet Take 1 tablet by mouth once daily 02/19/24   Lorren Greig PARAS, NP  dapagliflozin  propanediol (FARXIGA ) 10 MG TABS tablet Take 1 tablet (10 mg total) by mouth daily before breakfast. 04/04/24   Lorren Greig PARAS, NP  diltiazem  (CARDIZEM  CD) 120 MG 24 hr capsule Take 1 capsule (120 mg total) by mouth 2 (two) times daily. 08/31/23   Fenton, Clint R, PA  glimepiride  (AMARYL ) 1 MG tablet TAKE 1 TABLET BY MOUTH IN THE MORNING AND AT BEDTIME 03/26/24   Lorren Greig PARAS, NP  glimepiride  (AMARYL ) 4 MG tablet  Take 1 tablet (4 mg total) by mouth daily before breakfast. 04/04/24 07/03/24  Lorren Greig PARAS, NP  valACYclovir  (VALTREX ) 1000 MG tablet Take 1 tablet by mouth twice daily 01/02/24   Lorren Greig PARAS, NP    Family History Family History  Problem Relation Age of Onset   Diabetes Sister    Heart disease Neg Hx    Colon cancer Neg Hx    Stomach cancer Neg Hx    Esophageal cancer Neg Hx     Social History Social History   Tobacco Use   Smoking status: Former    Types: Cigarettes    Passive exposure: Past   Smokeless tobacco: Never   Tobacco comments:    Former Smoker Quit April 2013  08/12/21  Vaping Use   Vaping status: Never Used  Substance Use Topics   Alcohol use: Not Currently   Drug use: Not Currently    Comment: clean for long time     Allergies   Metformin  and related   Review of Systems Review of Systems  Constitutional:  Negative for chills, fatigue and fever.  Skin:        Bilateral thigh abscesses  All other systems reviewed and are negative.    Physical Exam Triage Vital Signs ED Triage Vitals  Encounter Vitals Group     BP 05/14/24 0819 129/78     Girls Systolic BP Percentile --      Girls Diastolic BP Percentile --      Boys Systolic BP Percentile --      Boys Diastolic BP Percentile --      Pulse Rate 05/14/24 0819 77     Resp 05/14/24 0819 16     Temp 05/14/24 0819 98.4 F (36.9 C)     Temp Source 05/14/24 0819 Oral     SpO2 05/14/24 0819 94 %     Weight --      Height --      Head Circumference --      Peak Flow --      Pain Score 05/14/24 0821 10     Pain Loc --      Pain Education --      Exclude from Growth Chart --    No data found.  Updated Vital Signs BP 129/78 (BP Location: Right Arm)   Pulse 77   Temp 98.4 F (36.9 C) (Oral)   Resp 16   SpO2 94%   Physical Exam Vitals reviewed.  Constitutional:      General: He is not in acute distress.    Appearance: He is not toxic-appearing.  Skin:    General: Skin is warm and dry.     Comments: There is a 2 x 2 cm indurated, mildly erythematous, tender lesion on the left inner thigh that appears consistent with an abscess.  There is a similar appearing lesion on the right inner thigh that is spontaneously draining purulent material.  Neurological:     Mental Status: He is alert.    UC Treatments / Results  Labs (all labs ordered are listed, but only abnormal results are displayed) Labs Reviewed - No data to display  EKG   Radiology No results found.  Procedures Incision and Drainage  Date/Time: 05/14/2024 9:01 AM  Performed by: Melvenia Manus BRAVO,  MD Authorized by: Melvenia Manus BRAVO, MD   Consent:    Consent obtained:  Verbal   Consent given by:  Patient   Risks, benefits, and alternatives were discussed: yes  Risks discussed:  Bleeding, incomplete drainage, pain and infection   Alternatives discussed:  No treatment Universal protocol:    Procedure explained and questions answered to patient or proxy's satisfaction: yes     Patient identity confirmed:  Verbally with patient Location:    Type:  Abscess   Location:  Lower extremity   Lower extremity location: left thigh/groin. Pre-procedure details:    Skin preparation:  Chlorhexidine with alcohol Sedation:    Sedation type:  None Anesthesia:    Anesthesia method:  Local infiltration   Local anesthetic:  Lidocaine  2% w/o epi Procedure type:    Complexity:  Simple Procedure details:    Ultrasound guidance: no     Incision types:  Stab incision   Incision depth:  Dermal   Wound management:  Probed and deloculated   Drainage:  Bloody   Drainage amount:  Moderate   Wound treatment:  Wound left open Post-procedure details:    Procedure completion:  Tolerated well, no immediate complications  (including critical care time)  Medications Ordered in UC Medications  lidocaine  (PF) (XYLOCAINE ) 1 % injection 5 mL (has no administration in time range)    Initial Impression / Assessment and Plan / UC Course  I have reviewed the triage vital signs and the nursing notes.  Pertinent labs & imaging results that were available during my care of the patient were reviewed by me and considered in my medical decision making (see chart for details).    Patient is a 58 year old presenting urgent care with bilateral thigh abscesses.  The right one has started to spontaneously drain, however I performed incision and drainage on the left abscess today with return of purulent/bloody material.  Patient tolerated procedure well.  Given that the right thigh abscess continues to spontaneously  drain, I have prescribed Bactrim  DS x 7-days.  Return precautions were reviewed with the patient and he is agreeable with the current plan.  He is medically stable for discharge at this time.  I recommend that he return to work without restriction on Friday, 05/16/2024.  Final Clinical Impressions(s) / UC Diagnoses   Final diagnoses:  Abscess     Discharge Instructions      The abscess on your left thigh was drained. Keep a dressing over the wound for the next 3 days and take the antibiotic that has been prescribed as instructed. You may return to work on Friday 05/16/24 with no restrictions. Please return to care if the wound becomes red or painful, or if you develop fever/chills.    ED Prescriptions     Medication Sig Dispense Auth. Provider   sulfamethoxazole -trimethoprim  (BACTRIM  DS) 800-160 MG tablet Take 1 tablet by mouth 2 (two) times daily for 7 days. 14 tablet Tomasz Steeves E, MD      PDMP not reviewed this encounter.   Melvenia Manus BRAVO, MD 05/14/24 [phone]

## 2024-05-14 NOTE — Discharge Instructions (Signed)
 The abscess on your left thigh was drained. Keep a dressing over the wound for the next 3 days and take the antibiotic that has been prescribed as instructed. You may return to work on Friday 05/16/24 with no restrictions. Please return to care if the wound becomes red or painful, or if you develop fever/chills.

## 2024-05-14 NOTE — ED Triage Notes (Signed)
 Patient here today with c/o abscesses on both inner thighs X 2 weeks. Patient states that the abscess on the right thigh busted this morning but the one on the left hasn't.

## 2024-05-17 ENCOUNTER — Other Ambulatory Visit: Payer: Self-pay | Admitting: Family

## 2024-05-17 DIAGNOSIS — E785 Hyperlipidemia, unspecified: Secondary | ICD-10-CM

## 2024-05-19 NOTE — Telephone Encounter (Signed)
 Requested Prescriptions  Pending Prescriptions Disp Refills   atorvastatin  (LIPITOR) 40 MG tablet [Pharmacy Med Name: Atorvastatin  Calcium  40 MG Oral Tablet] 90 tablet 0    Sig: Take 1 tablet by mouth once daily     Cardiovascular:  Antilipid - Statins Failed - 05/19/2024  1:44 PM      Failed - Lipid Panel in normal range within the last 12 months    Cholesterol, Total  Date Value Ref Range Status  07/27/2023 159 100 - 199 mg/dL Final   LDL Chol Calc (NIH)  Date Value Ref Range Status  07/27/2023 94 0 - 99 mg/dL Final   HDL  Date Value Ref Range Status  07/27/2023 45 >39 mg/dL Final   Triglycerides  Date Value Ref Range Status  07/27/2023 108 0 - 149 mg/dL Final         Passed - Patient is not pregnant      Passed - Valid encounter within last 12 months    Recent Outpatient Visits           1 month ago Type 2 diabetes mellitus with hyperglycemia, without long-term current use of insulin (HCC)   [place] [facility], Amy J, NP   4 months ago Type 2 diabetes mellitus with hyperglycemia, without long-term current use of insulin (HCC)   [place] [facility], Amy J, NP   5 months ago Type 2 diabetes mellitus with hyperglycemia, without long-term current use of insulin (HCC)   [place] [facility], Amy J, NP   7 months ago Type 2 diabetes mellitus with hyperglycemia, without long-term current use of insulin [facility])   [place] [facility], Amy J, NP   9 months ago Type 2 diabetes mellitus with hyperglycemia, without long-term current use of insulin [facility])   [place] [facility], Greig PARAS, NP

## 2024-05-23 ENCOUNTER — Other Ambulatory Visit: Payer: Self-pay | Admitting: Family

## 2024-05-23 DIAGNOSIS — E1165 Type 2 diabetes mellitus with hyperglycemia: Secondary | ICD-10-CM

## 2024-05-23 NOTE — Telephone Encounter (Signed)
 Copied from CRM #[id number]. Topic: Clinical - Medication Refill >> May 23, 2024  4:42 PM Jasmin G wrote: Medication: dapagliflozin  propanediol (FARXIGA ) 10 MG TABS tablet  Has the patient contacted their pharmacy? No (Agent: If no, request that the patient contact the pharmacy for the refill. If patient does not wish to contact the pharmacy document the reason why and proceed with request.) (Agent: If yes, when and what did the pharmacy advise?)  This is the patient's preferred pharmacy:  [facility] [address] ([address]), [place] - [address] [place] ([place]) [place] [address] Phone: [phone] Fax: [phone]  Is this the correct pharmacy for this prescription? Yes If no, delete pharmacy and type the correct one.   Has the prescription been filled recently? No  Is the patient out of the medication? Yes  Has the patient been seen for an appointment in the last year OR does the patient have an upcoming appointment? Yes  Can we respond through MyChart? No  Agent: Please be advised that Rx refills may take up to 3 business days. We ask that you follow-up with your pharmacy.

## 2024-05-26 MED ORDER — DAPAGLIFLOZIN PROPANEDIOL 10 MG PO TABS: 10.0000 mg | ORAL_TABLET | Freq: Every day | ORAL | 0 refills | Status: AC

## 2024-05-26 NOTE — Telephone Encounter (Signed)
 Requested Prescriptions  Pending Prescriptions Disp Refills   dapagliflozin  propanediol (FARXIGA ) 10 MG TABS tablet 90 tablet 0    Sig: Take 1 tablet (10 mg total) by mouth daily before breakfast.     Endocrinology:  Diabetes - SGLT2 Inhibitors Passed - 05/26/2024  4:12 PM      Passed - Cr in normal range and within 360 days    Creatinine  Date Value Ref Range Status  04/06/2023 1.37 (H) 0.61 - 1.24 mg/dL Final   Creatinine, Ser  Date Value Ref Range Status  04/04/2024 1.23 0.76 - 1.27 mg/dL Final   Creatinine, Urine  Date Value Ref Range Status  10/21/2011 261.0 mg/dL Final         Passed - HBA1C is between 0 and 7.9 and within 180 days    HbA1c, POC (controlled diabetic range)  Date Value Ref Range Status  01/04/2024 7.5 (A) 0.0 - 7.0 % Final   Hgb A1c MFr Bld  Date Value Ref Range Status  04/04/2024 7.0 (H) 4.8 - 5.6 % Final    Comment:             Prediabetes: 5.7 - 6.4          Diabetes: >6.4          Glycemic control for adults with diabetes: <7.0          Passed - eGFR in normal range and within 360 days    GFR calc Af Amer  Date Value Ref Range Status  01/14/2019 >60 >60 mL/min Final   GFR, Estimated  Date Value Ref Range Status  04/06/2023 >60 >60 mL/min Final    Comment:    (NOTE) Calculated using the CKD-EPI Creatinine Equation (2021)    eGFR  Date Value Ref Range Status  04/04/2024 69 >59 mL/min/1.73 Final         Passed - Valid encounter within last 6 months    Recent Outpatient Visits           1 month ago Type 2 diabetes mellitus with hyperglycemia, without long-term current use of insulin (HCC)   [place] [facility], Amy J, NP   4 months ago Type 2 diabetes mellitus with hyperglycemia, without long-term current use of insulin (HCC)   [place] [facility], Amy J, NP   6 months ago Type 2 diabetes mellitus with hyperglycemia, without long-term current use of insulin (HCC)   [facility], Amy J, NP   7 months ago Type 2 diabetes mellitus with hyperglycemia, without long-term current use of insulin [facility])   [place] [facility], Amy J, NP   10 months ago Type 2 diabetes mellitus with hyperglycemia, without long-term current use of insulin [facility])   [place] [facility], Greig PARAS, NP

## 2024-06-25 ENCOUNTER — Other Ambulatory Visit: Payer: Self-pay

## 2024-06-25 ENCOUNTER — Telehealth: Payer: Self-pay

## 2024-06-25 DIAGNOSIS — E1165 Type 2 diabetes mellitus with hyperglycemia: Secondary | ICD-10-CM

## 2024-06-25 NOTE — Telephone Encounter (Signed)
 Copied from CRM #[id number]. Topic: Clinical - Prescription Issue >> Jun 24, 2024  3:41 PM Pinkey ORN wrote: Reason for CRM: dapagliflozin  propanediol (FARXIGA ) 10 MG TABS tablet >> Jun 24, 2024  3:43 PM Pinkey ORN wrote: Patient is requesting the generic brand of dapagliflozin  propanediol (FARXIGA ) 10 MG TABS tablet be called into the pharmacy. Patient states that the original medication is coming back as $500+ , patient states he's never had this issue before.

## 2024-06-26 NOTE — Telephone Encounter (Signed)
 Call patient with update from pharmacy tech and update me with patient's plan.

## 2024-06-27 ENCOUNTER — Other Ambulatory Visit: Payer: Self-pay

## 2024-06-27 ENCOUNTER — Other Ambulatory Visit: Payer: Self-pay | Admitting: Family

## 2024-06-27 DIAGNOSIS — E1165 Type 2 diabetes mellitus with hyperglycemia: Secondary | ICD-10-CM

## 2024-06-27 MED ORDER — SITAGLIPTIN PHOSPHATE 25 MG PO TABS: 50.0000 mg | ORAL_TABLET | Freq: Every day | ORAL | 0 refills | Status: DC

## 2024-06-27 NOTE — Addendum Note (Signed)
 Addended by: Amon Costilla on: 06/27/2024 05:16 PM   Modules accepted: Orders

## 2024-06-27 NOTE — Telephone Encounter (Signed)
 Noted

## 2024-06-27 NOTE — Addendum Note (Signed)
 Addended by: Tayja Manzer on: 06/27/2024 05:33 PM   Modules accepted: Orders

## 2024-06-27 NOTE — Telephone Encounter (Addendum)
 Rx was sent to [facility] in pharmacy so it was resent to preferred pharmacy.    Patient states he no longer uses that pharmacy.  He is aware that new medication was sent to [facility] pharmacy.   Called pharmacy to see if medication would be covered by insurance.  They said they would have to call insurance to verify.  They said patient is set on the text list when medication is ready for pick up.

## 2024-06-27 NOTE — Telephone Encounter (Signed)
 Sitagliptin  prescribed.

## 2024-06-29 ENCOUNTER — Encounter: Payer: Self-pay | Admitting: Family

## 2024-07-01 DIAGNOSIS — E1165 Type 2 diabetes mellitus with hyperglycemia: Secondary | ICD-10-CM

## 2024-07-01 MED ORDER — GLIMEPIRIDE 4 MG PO TABS: 8.0000 mg | ORAL_TABLET | Freq: Every day | ORAL | 0 refills | Status: DC

## 2024-07-01 NOTE — Telephone Encounter (Signed)
 Increase Glimepiride  from 4 mg to 8 mg as prescribed.

## 2024-07-01 NOTE — Telephone Encounter (Signed)
 Spoke with patient over the phone.  Provided patient with the updated message from his PCP.   He verbalized understanding and was grateful for the change because he states he has been out of the medication since Thursday. He states he will pick up the medication today from the pharmacy. Advised to monitor and log blood sugars and be mindful of hypoglycemic signs.   He was reminded of his next appointment.   Patient verbalized understanding.

## 2024-07-04 ENCOUNTER — Telehealth: Payer: Self-pay

## 2024-07-04 NOTE — Telephone Encounter (Signed)
 SABRA

## 2024-07-11 ENCOUNTER — Encounter: Payer: Self-pay | Admitting: Cardiology

## 2024-07-11 ENCOUNTER — Ambulatory Visit: Attending: Cardiology | Admitting: Cardiology

## 2024-07-11 VITALS — BP 140/50 | HR 78 | Ht 70.0 in | Wt 248.2 lb

## 2024-07-11 DIAGNOSIS — G4733 Obstructive sleep apnea (adult) (pediatric): Secondary | ICD-10-CM | POA: Diagnosis not present

## 2024-07-11 NOTE — Progress Notes (Signed)
 SLEEP MEDICINE VIRTUAL VISIT      Date:  07/11/2024   ID:  [name] [name], DOB [birth year], MRN [id number] The patient was identified using 2 identifiers.  PCP:  Jaycee Greig PARAS, NP   [facility] Providers Cardiologist:  None     Evaluation Performed:  Follow-Up Visit  Chief Complaint:  OSA  History of Present Illness:    [name] [name] is a 59 y.o. male with a hx of PAF and atrial flutter followed in A-fib clinic.  He also has a history of severe obstructive sleep but never pursued needed with getting his device after his sleep study.     His initial study was done in January 2021 which demonstrated severe obstructive sleep apnea with an AHI of 33.9/h with no central events.  His RDI was 44.7/h.  AHI during REM sleep was 61.9/h and AHI during supine sleep was 89/h.  He underwent CPAP titration to 12 cm H2O and CPAP.  At the time his CPAP was ordered he never followed through.  He was seen by me in December 2023 and at that time ordered a split-night sleep study.  This was done on 07/13/2002 showing severe obstructive sleep apnea with an AHI of 80.7/h with no significant central events.  He did have nocturnal hypoxemia with lowest O2 saturation 79%.  He was started on auto CPAP from 4 to 15 cm H2O. ONO on CPAP in 2024 showed no residual nocturnal hypoxemia  He is doing well with his PAP device but says that he needs financial assistance in getting new PAP supplies.  He tolerates the mask and feels the pressure is adequate.  He feels rested in the am and has no significant daytime sleepiness.  He denies any significant mouth or nasal dryness or nasal congestion.  He does not think that he snores. He says that he did not use it much in the past month because of things going on home during the [place] but plans to get back on it.   Past Medical History:  Diagnosis Date   ARF (acute renal failure)    Atrial flutter (HCC)    Community acquired bacterial pneumonia    Diastolic CHF, chronic  (HCC)    Leukocytosis    N&V (nausea and vomiting)    SOB (shortness of breath)    Substance abuse (HCC)    Prior hx of cocaine (quit 5 years ago)/EtOH (clean for 14 months) as of 10/2011   Past Surgical History:  Procedure Laterality Date   CARDIOVERSION N/A 10/29/2020   Procedure: CARDIOVERSION;  Surgeon: Nahser, Aleene PARAS, MD;  [facility];  Service: Cardiovascular;  Laterality: N/A;   COLONOSCOPY       Current Meds  Medication Sig   acetaminophen  (TYLENOL ) 500 MG tablet Take 1,000 mg by mouth as needed for mild pain, moderate pain or headache.   apixaban  (ELIQUIS ) 5 MG TABS tablet Take 1 tablet (5 mg total) by mouth 2 (two) times daily.   atorvastatin  (LIPITOR) 40 MG tablet Take 1 tablet by mouth once daily   diltiazem  (CARDIZEM  CD) 120 MG 24 hr capsule Take 1 capsule (120 mg total) by mouth 2 (two) times daily.   doxycycline  (VIBRA -TABS) 100 MG tablet Take 100 mg by mouth as needed (throat infection).   glimepiride  (AMARYL ) 4 MG tablet Take 2 tablets (8 mg total) by mouth daily before breakfast.   valACYclovir  (VALTREX ) 1000 MG tablet Take 1 tablet by mouth twice daily     Allergies:  Metformin  and related   Social History   Tobacco Use   Smoking status: Former    Types: Cigarettes    Passive exposure: Past   Smokeless tobacco: Never   Tobacco comments:    Former Smoker Quit April 2013 08/12/21  Vaping Use   Vaping status: Never Used  Substance Use Topics   Alcohol use: Not Currently   Drug use: Not Currently    Comment: clean for long time     Family Hx: The patient's family history includes Diabetes in his sister. There is no history of Heart disease, Colon cancer, Stomach cancer, or Esophageal cancer.  ROS:   Please see the history of present illness.     All other systems reviewed and are negative.   Prior Sleep studies:   The following studies were reviewed today:  Split-night sleep study and PAP compliance download  Labs/Other Tests and  Data Reviewed:     Recent Labs: 04/04/2024: BUN 10; Creatinine, Ser 1.23; Potassium 4.4; Sodium 142   Wt Readings from Last 3 Encounters:  07/11/24 248 lb 3.2 oz (112.6 kg)  04/04/24 242 lb 9.6 oz (110 kg)  02/29/24 246 lb 3.2 oz (111.7 kg)     Risk Assessment/Calculations:      Objective:    Vital Signs:  BP (!) 140/50   Pulse 78   Ht 5' 10 (1.778 m)   Wt 248 lb 3.2 oz (112.6 kg)   SpO2 96%   BMI 35.61 kg/m   GEN: Well nourished, well developed in no acute distress HEENT: Normal NECK: No JVD; No carotid bruits LYMPHATICS: No lymphadenopathy CARDIAC:RRR, no murmurs, rubs, gallops RESPIRATORY:  Clear to auscultation without rales, wheezing or rhonchi  ABDOMEN: Soft, non-tender, non-distended MUSCULOSKELETAL:  No edema; No deformity  SKIN: Warm and dry NEUROLOGIC:  Alert and oriented x 3 PSYCHIATRIC:  Normal affect  ASSESSMENT & PLAN:    OSA - The patient is tolerating PAP therapy well without any problems. The PAP download performed by his DME was personally reviewed and interpreted by me today and showed an AHI of 2 /hr on auto CPAP 4-15  cm H2O with 17% compliance in using more than 4 hours nightly.  The patient has been using and benefiting from PAP use and will continue to benefit from therapy.  -Okay now on CPAP 2024 showed no residual nocturnal hypoxemia -I encouraged him to be more compliant with his device   Total time of encounter: 15 minutes total time ofspent in face-to-face patient care on the date of this encounter. This time includes coordination of care and counseling regarding above mentioned problem list. Remainder of non-face-to-face time involved reviewing chart documents/testing relevant to the patient encounter and documentation in the medical record. I have independently reviewed documentation from referring provider.    Medication Adjustments/Labs and Tests Ordered: Current medicines are reviewed at length with the patient today.  Concerns regarding  medicines are outlined above.   Tests Ordered: No orders of the defined types were placed in this encounter.   Medication Changes: No orders of the defined types were placed in this encounter.   Follow Up:  In Person in 1 year(s)  Signed, Wilbert Bihari, MD  07/11/2024 8:18 AM    [facility]

## 2024-07-11 NOTE — Patient Instructions (Signed)
 Medication Instructions:  Your physician recommends that you continue on your current medications as directed. Please refer to the Current Medication list given to you today.  *If you need a refill on your cardiac medications before your next appointment, please call your pharmacy*  Lab Work: NONE If you have labs (blood work) drawn today and your tests are completely normal, you will receive your results only by: MyChart Message (if you have MyChart) OR A paper copy in the mail If you have any lab test that is abnormal or we need to change your treatment, we will call you to review the results.  Testing/Procedures: NONE  Follow-Up: At [facility], you and your health needs are our priority.  As part of our continuing mission to provide you with exceptional heart care, our providers are all part of one team.  This team includes your primary Cardiologist (physician) and Advanced Practice Providers or APPs (Physician Assistants and Nurse Practitioners) who all work together to provide you with the care you need, when you need it.  Your next appointment:   1 year(s)  Provider:   Wilbert Bihari, MD

## 2024-07-18 ENCOUNTER — Ambulatory Visit: Admitting: Family

## 2024-07-18 ENCOUNTER — Encounter: Payer: Self-pay | Admitting: Family

## 2024-07-18 VITALS — BP 154/53 | HR 77 | Temp 98.3°F | Resp 18 | Ht 70.0 in | Wt 248.2 lb

## 2024-07-18 DIAGNOSIS — E1165 Type 2 diabetes mellitus with hyperglycemia: Secondary | ICD-10-CM | POA: Diagnosis not present

## 2024-07-18 DIAGNOSIS — Z7984 Long term (current) use of oral hypoglycemic drugs: Secondary | ICD-10-CM | POA: Diagnosis not present

## 2024-07-18 MED ORDER — DAPAGLIFLOZIN PROPANEDIOL 10 MG PO TABS: 10.0000 mg | ORAL_TABLET | Freq: Every day | ORAL | 3 refills | Status: AC

## 2024-07-18 MED ORDER — GLIMEPIRIDE 4 MG PO TABS: 8.0000 mg | ORAL_TABLET | Freq: Every day | ORAL | 0 refills | Status: AC

## 2024-07-18 NOTE — Progress Notes (Signed)
 "   Patient ID: [name] [name], male    DOB: 23-Jul-1965  MRN: [id number]  CC: Chronic Conditions Follow-Up  Subjective: [name] [name] is a 59 y.o. male who presents for chronic conditions follow-up.   His concerns today include:  - Doing well on Glimepiride  and Dapagliflozin  Propanediol, no issues/concerns. States he could use improvement in diet. Denies red flag symptoms associated with diabetes. - Established with Cardiology. He does not complain of red flag symptoms such as but not limited to chest pain, shortness of breath, worst headache of life, nausea/vomiting.   Patient Active Problem List   Diagnosis Date Noted   Hypercoagulable state due to paroxysmal atrial fibrillation (HCC) 02/23/2023   Sickle cell trait 08/15/2022   Polycythemia, secondary 08/15/2022   RBC microcytosis 08/04/2022   OSA (obstructive sleep apnea) 07/13/2022   Herpes 07/08/2022   Hyperlipidemia 11/30/2020   Persistent atrial fibrillation (HCC)    Paroxysmal atrial fibrillation (HCC) 05/29/2019   Substance abuse (HCC)    Diastolic CHF, chronic    Diastolic CHF, chronic (HCC) 10/24/2011   Atrial flutter (HCC) 10/22/2011   Community acquired bacterial pneumonia 10/21/2011   SOB (shortness of breath) 10/21/2011   N&V (nausea and vomiting) 10/21/2011   ARF (acute renal failure) 10/21/2011   Tobacco abuse 10/21/2011   Leukocytosis 10/21/2011   Fever 10/21/2011     Medications Ordered Prior to Encounter[1]  Allergies[2]  Social History   Socioeconomic History   Marital status: Married    Spouse name: Not on file   Number of children: Not on file   Years of education: Not on file   Highest education level: Not on file  Occupational History   Occupation: forklift driver  Tobacco Use   Smoking status: Former    Types: Cigarettes    Passive exposure: Past   Smokeless tobacco: Never   Tobacco comments:    Former Smoker Quit April 2013 08/12/21  Vaping Use   Vaping status: Never Used   Substance and Sexual Activity   Alcohol use: Not Currently   Drug use: Not Currently    Comment: clean for long time   Sexual activity: Yes  Other Topics Concern   Not on file  Social History Narrative   Not on file   Social Drivers of Health   Tobacco Use: Medium Risk (07/18/2024)   Patient History    Smoking Tobacco Use: Former    Smokeless Tobacco Use: Never    Passive Exposure: Past  Physicist, Medical Strain: Medium Risk (08/14/2022)   Overall Financial Resource Strain (CARDIA)    Difficulty of Paying Living Expenses: Somewhat hard  Food Insecurity: No Food Insecurity (08/04/2022)   Hunger Vital Sign    Worried About Running Out of Food in the Last Year: Never true    Ran Out of Food in the Last Year: Never true  Transportation Needs: No Transportation Needs (08/04/2022)   PRAPARE - Administrator, Civil Service (Medical): No    Lack of Transportation (Non-Medical): No  Physical Activity: Inactive (10/19/2023)   Exercise Vital Sign    Days of Exercise per Week: 0 days    Minutes of Exercise per Session: 0 min  Stress: No Stress Concern Present (10/19/2023)   [employer] of Occupational Health - Occupational Stress Questionnaire    Feeling of Stress : Only a little  Social Connections: Moderately Isolated (10/19/2023)   Social Connection and Isolation Panel    Frequency of Communication with Friends and Family: More than three times  a week    Frequency of Social Gatherings with Friends and Family: More than three times a week    Attends Religious Services: Never    Database Administrator or Organizations: No    Attends Banker Meetings: Never    Marital Status: Married  Catering Manager Violence: Not At Risk (08/04/2022)   Humiliation, Afraid, Rape, and Kick questionnaire    Fear of Current or Ex-Partner: No    Emotionally Abused: No    Physically Abused: No    Sexually Abused: No  Depression (PHQ2-9): Low Risk (07/18/2024)   Depression  (PHQ2-9)    PHQ-2 Score: 0  Alcohol Screen: Low Risk (10/19/2023)   Alcohol Screen    Last Alcohol Screening Score (AUDIT): 0  Housing: Low Risk (08/04/2022)   Housing    Last Housing Risk Score: 0  Utilities: Not At Risk (08/04/2022)   AHC Utilities    Threatened with loss of utilities: No  Health Literacy: Adequate Health Literacy (10/19/2023)   B1300 Health Literacy    Frequency of need for help with medical instructions: Never    Family History  Problem Relation Age of Onset   Diabetes Sister    Heart disease Neg Hx    Colon cancer Neg Hx    Stomach cancer Neg Hx    Esophageal cancer Neg Hx     Past Surgical History:  Procedure Laterality Date   CARDIOVERSION N/A 10/29/2020   Procedure: CARDIOVERSION;  Surgeon: Nahser, Aleene PARAS, MD;  Location: [facility];  Service: Cardiovascular;  Laterality: N/A;   COLONOSCOPY      ROS: Review of Systems Negative except as stated above  PHYSICAL EXAM: BP (!) 154/53   Pulse 77   Temp 98.3 F (36.8 C) (Oral)   Resp 18   Ht 5' 10 (1.778 m)   Wt 248 lb 3.2 oz (112.6 kg)   SpO2 92%   BMI 35.61 kg/m   Physical Exam HENT:     Head: Normocephalic and atraumatic.     Nose: Nose normal.     Mouth/Throat:     Mouth: Mucous membranes are moist.     Pharynx: Oropharynx is clear.  Eyes:     Extraocular Movements: Extraocular movements intact.     Conjunctiva/sclera: Conjunctivae normal.     Pupils: Pupils are equal, round, and reactive to light.  Cardiovascular:     Rate and Rhythm: Normal rate and regular rhythm.     Pulses: Normal pulses.     Heart sounds: Normal heart sounds.  Pulmonary:     Effort: Pulmonary effort is normal.     Breath sounds: Normal breath sounds.  Musculoskeletal:        General: Normal range of motion.     Cervical back: Normal range of motion and neck supple.  Neurological:     General: No focal deficit present.     Mental Status: He is alert and oriented to person, place, and time.   Psychiatric:        Mood and Affect: Mood normal.        Behavior: Behavior normal.     ASSESSMENT AND PLAN: 1. Type 2 diabetes mellitus with hyperglycemia, without long-term current use of insulin (HCC) (Primary) - Hemoglobin A1c result pending.  - Continue Glimepiride  and Dapagliflozin  Propanediol as prescribed.  - Routine screening.  - Discussed the importance of healthy eating habits, low-carbohydrate diet, low-sugar diet, regular aerobic exercise (at least 150 minutes a week as tolerated) and medication  compliance to achieve or maintain control of diabetes. Counseled on medication adherence/adverse effects.  - Follow-up with primary provider as scheduled.  - Hemoglobin A1c - Microalbumin / creatinine urine ratio - glimepiride  (AMARYL ) 4 MG tablet; Take 2 tablets (8 mg total) by mouth daily before breakfast.  Dispense: 180 tablet; Refill: 0 - dapagliflozin  propanediol (FARXIGA ) 10 MG TABS tablet; Take 1 tablet (10 mg total) by mouth daily.  Dispense: 90 tablet; Refill: 3 - Basic Metabolic Panel   Patient was given the opportunity to ask questions.  Patient verbalized understanding of the plan and was able to repeat key elements of the plan. Patient was given clear instructions to go to Emergency Department or return to medical center if symptoms don't improve, worsen, or new problems develop.The patient verbalized understanding.   Orders Placed This Encounter  Procedures   Hemoglobin A1c   Microalbumin / creatinine urine ratio   Basic Metabolic Panel     Requested Prescriptions   Signed Prescriptions Disp Refills   glimepiride  (AMARYL ) 4 MG tablet 180 tablet 0    Sig: Take 2 tablets (8 mg total) by mouth daily before breakfast.   dapagliflozin  propanediol (FARXIGA ) 10 MG TABS tablet 90 tablet 3    Sig: Take 1 tablet (10 mg total) by mouth daily.    Return in about 3 months (around 10/16/2024) for Follow-Up or next available chronic conditions.  Greig JINNY Chute, NP       [1]  Current Outpatient Medications on File Prior to Visit  Medication Sig Dispense Refill   acetaminophen  (TYLENOL ) 500 MG tablet Take 1,000 mg by mouth as needed for mild pain, moderate pain or headache.     apixaban  (ELIQUIS ) 5 MG TABS tablet Take 1 tablet (5 mg total) by mouth 2 (two) times daily. 60 tablet 11   atorvastatin  (LIPITOR) 40 MG tablet Take 1 tablet by mouth once daily 90 tablet 0   diltiazem  (CARDIZEM  CD) 120 MG 24 hr capsule Take 1 capsule (120 mg total) by mouth 2 (two) times daily. 60 capsule 6   doxycycline  (VIBRA -TABS) 100 MG tablet Take 100 mg by mouth as needed (throat infection).     valACYclovir  (VALTREX ) 1000 MG tablet Take 1 tablet by mouth twice daily 20 tablet 11   No current facility-administered medications on file prior to visit.  [2]  Allergies Allergen Reactions   Metformin  And Related Other (See Comments)    Pt endorses blurred vision with metformin  use.    "

## 2024-07-18 NOTE — Addendum Note (Signed)
 Addended by: CELESTIA GUSTAV BRAVO on: 07/18/2024 08:46 AM   Modules accepted: Orders

## 2024-07-18 NOTE — Progress Notes (Signed)
 Follow up.

## 2024-07-19 LAB — BASIC METABOLIC PANEL WITH GFR
BUN/Creatinine Ratio: 10 (ref 9–20)
BUN: 11 mg/dL (ref 6–24)
CO2: 23 mmol/L (ref 20–29)
Calcium: 10.1 mg/dL (ref 8.7–10.2)
Chloride: 101 mmol/L (ref 96–106)
Creatinine, Ser: 1.12 mg/dL (ref 0.76–1.27)
Glucose: 151 mg/dL — ABNORMAL HIGH (ref 70–99)
Potassium: 4.8 mmol/L (ref 3.5–5.2)
Sodium: 139 mmol/L (ref 134–144)
eGFR: 77 mL/min/1.73

## 2024-07-19 LAB — HEMOGLOBIN A1C
Est. average glucose Bld gHb Est-mCnc: 174 mg/dL
Hgb A1c MFr Bld: 7.7 % — ABNORMAL HIGH (ref 4.8–5.6)

## 2024-07-21 ENCOUNTER — Ambulatory Visit: Payer: Self-pay | Admitting: Family

## 2024-07-21 DIAGNOSIS — E1165 Type 2 diabetes mellitus with hyperglycemia: Secondary | ICD-10-CM

## 2024-07-21 MED ORDER — DULAGLUTIDE 1.5 MG/0.5ML ~~LOC~~ SOAJ: 1.5000 mg | SUBCUTANEOUS | 0 refills | Status: AC

## 2024-08-11 ENCOUNTER — Ambulatory Visit: Payer: Self-pay

## 2024-08-11 NOTE — Telephone Encounter (Signed)
 noted

## 2024-08-11 NOTE — Telephone Encounter (Signed)
 Patient/caller partially completed triage.  Reason for refusal: calling for advice only.   Reason for Disposition  [1] Sinus congestion as part of a cold AND [2] present < 10 days  Answer Assessment - Initial Assessment Questions Patient calling in for advice only. He would like to know what otc medication is safe to take for sinus congestion with diabetes. Patient answered limited triage questions, denies breathing difficulties and fevers. Advised to select OTC sinus medication that specifies sugar free and to check in with pharmacist if unsure.  Patient plans on home treatment with OTC medicine, will back if no improvement or if he develops new or worsening symptoms.     1. LOCATION: Where does it hurt?      Sinus pain and pressure, congestion 2. ONSET: When did the sinus pain start?  (e.g., hours, days)      Few days  3. SEVERITY: How bad is the pain?   (Scale 0-10; or none, mild, moderate or severe)     Mild  4. RECURRENT SYMPTOM: Have you ever had sinus problems before? If Yes, ask: When was the last time? and What happened that time?      Limited triage 5. NASAL CONGESTION: Is the nose blocked? If Yes, ask: Can you open it or must you breathe through your mouth?     congested 6. NASAL DISCHARGE: Do you have discharge from your nose? If so ask, What color?     clear 7. FEVER: Do you have a fever? If Yes, ask: What is it, how was it measured, and when did it start?      Denies  8. OTHER SYMPTOMS: Do you have any other symptoms? (e.g., sore throat, cough, earache, difficulty breathing)     Nose stopped on one side. Denies breathing difficulty  Protocols used: Sinus Pain or Congestion-A-AH  Reason for Triage: head congestion, stuffy nose, itchy throat, sore throat. since he is diabetic wants make sure he can take OTC medication.

## 2024-08-12 ENCOUNTER — Other Ambulatory Visit: Payer: Self-pay | Admitting: Family

## 2024-08-12 DIAGNOSIS — B009 Herpesviral infection, unspecified: Secondary | ICD-10-CM

## 2024-08-13 MED ORDER — VALACYCLOVIR HCL 1 G PO TABS: 1000.0000 mg | ORAL_TABLET | Freq: Two times a day (BID) | ORAL | 11 refills | Status: AC

## 2024-08-13 NOTE — Telephone Encounter (Signed)
 Requested Prescriptions  Pending Prescriptions Disp Refills   valACYclovir  (VALTREX ) 1000 MG tablet 20 tablet 11    Sig: Take 1 tablet (1,000 mg total) by mouth 2 (two) times daily.     Antimicrobials:  Antiviral Agents - Anti-Herpetic Passed - 08/13/2024  4:59 PM      Passed - Valid encounter within last 12 months    Recent Outpatient Visits           3 weeks ago Type 2 diabetes mellitus with hyperglycemia, without long-term current use of insulin (HCC)   [place] [facility], Amy J, NP   4 months ago Type 2 diabetes mellitus with hyperglycemia, without long-term current use of insulin [facility])   [place] [facility], Amy J, NP   7 months ago Type 2 diabetes mellitus with hyperglycemia, without long-term current use of insulin [facility])   [place] [facility], Amy J, NP   8 months ago Type 2 diabetes mellitus with hyperglycemia, without long-term current use of insulin [facility])   [place] [facility], Amy J, NP   9 months ago Type 2 diabetes mellitus with hyperglycemia, without long-term current use of insulin [facility])   [place] [facility], Greig PARAS, NP

## 2024-08-21 ENCOUNTER — Ambulatory Visit: Payer: Self-pay | Admitting: Family

## 2024-08-22 ENCOUNTER — Ambulatory Visit: Admitting: Family

## 2024-10-17 ENCOUNTER — Ambulatory Visit: Payer: Self-pay | Admitting: Family
